# Patient Record
Sex: Female | Born: 1955
Health system: Southern US, Community
[De-identification: ages and names within clinical notes are randomized; demographics above are authoritative.]

## PROBLEM LIST (undated history)

## (undated) DIAGNOSIS — D649 Anemia, unspecified: Secondary | ICD-10-CM

## (undated) DIAGNOSIS — F32A Depression, unspecified: Secondary | ICD-10-CM

## (undated) DIAGNOSIS — M1990 Unspecified osteoarthritis, unspecified site: Secondary | ICD-10-CM

## (undated) DIAGNOSIS — IMO0002 Reserved for concepts with insufficient information to code with codable children: Secondary | ICD-10-CM

## (undated) DIAGNOSIS — M6530 Trigger finger, unspecified finger: Secondary | ICD-10-CM

## (undated) DIAGNOSIS — K219 Gastro-esophageal reflux disease without esophagitis: Secondary | ICD-10-CM

## (undated) DIAGNOSIS — M8430XA Stress fracture, unspecified site, initial encounter for fracture: Secondary | ICD-10-CM

## (undated) DIAGNOSIS — E785 Hyperlipidemia, unspecified: Secondary | ICD-10-CM

## (undated) DIAGNOSIS — F418 Other specified anxiety disorders: Secondary | ICD-10-CM

## (undated) DIAGNOSIS — B009 Herpesviral infection, unspecified: Secondary | ICD-10-CM

## (undated) DIAGNOSIS — T7840XA Allergy, unspecified, initial encounter: Secondary | ICD-10-CM

## (undated) DIAGNOSIS — R87619 Unspecified abnormal cytological findings in specimens from cervix uteri: Secondary | ICD-10-CM

## (undated) DIAGNOSIS — F329 Major depressive disorder, single episode, unspecified: Secondary | ICD-10-CM

## (undated) DIAGNOSIS — H269 Unspecified cataract: Secondary | ICD-10-CM

## (undated) DIAGNOSIS — R739 Hyperglycemia, unspecified: Secondary | ICD-10-CM

## (undated) HISTORY — DX: Unspecified cataract: H26.9

## (undated) HISTORY — DX: Reserved for concepts with insufficient information to code with codable children: IMO0002

## (undated) HISTORY — PX: LEEP: SHX91

## (undated) HISTORY — DX: Unspecified osteoarthritis, unspecified site: M19.90

## (undated) HISTORY — DX: Depression, unspecified: F32.A

## (undated) HISTORY — DX: Herpesviral infection, unspecified: B00.9

## (undated) HISTORY — DX: Unspecified abnormal cytological findings in specimens from cervix uteri: R87.619

## (undated) HISTORY — DX: Major depressive disorder, single episode, unspecified: F32.9

## (undated) HISTORY — PX: HAMMER TOE SURGERY: SHX385

## (undated) HISTORY — DX: Gastro-esophageal reflux disease without esophagitis: K21.9

## (undated) HISTORY — DX: Other specified anxiety disorders: F41.8

## (undated) HISTORY — DX: Anemia, unspecified: D64.9

## (undated) HISTORY — DX: Allergy, unspecified, initial encounter: T78.40XA

## (undated) HISTORY — PX: WISDOM TOOTH EXTRACTION: SHX21

## (undated) HISTORY — DX: Trigger finger, unspecified finger: M65.30

## (undated) HISTORY — PX: REFRACTIVE SURGERY: SHX103

## (undated) HISTORY — DX: Hyperlipidemia, unspecified: E78.5

## (undated) HISTORY — DX: Stress fracture, unspecified site, initial encounter for fracture: M84.30XA

## (undated) HISTORY — PX: COLONOSCOPY: SHX174

## (undated) HISTORY — PX: EYE SURGERY: SHX253

## (undated) HISTORY — DX: Hyperglycemia, unspecified: R73.9

---

## 1998-06-15 ENCOUNTER — Other Ambulatory Visit: Admission: RE | Admit: 1998-06-15 | Discharge: 1998-06-15 | Payer: Self-pay | Admitting: Obstetrics and Gynecology

## 1998-07-08 ENCOUNTER — Encounter: Payer: Self-pay | Admitting: Obstetrics and Gynecology

## 1998-07-08 ENCOUNTER — Ambulatory Visit (HOSPITAL_COMMUNITY): Admission: RE | Admit: 1998-07-08 | Discharge: 1998-07-08 | Payer: Self-pay | Admitting: Obstetrics and Gynecology

## 1999-07-22 ENCOUNTER — Other Ambulatory Visit: Admission: RE | Admit: 1999-07-22 | Discharge: 1999-07-22 | Payer: Self-pay | Admitting: Obstetrics and Gynecology

## 1999-08-13 ENCOUNTER — Encounter: Payer: Self-pay | Admitting: Obstetrics and Gynecology

## 1999-08-13 ENCOUNTER — Ambulatory Visit (HOSPITAL_COMMUNITY): Admission: RE | Admit: 1999-08-13 | Discharge: 1999-08-13 | Payer: Self-pay | Admitting: Obstetrics and Gynecology

## 2000-07-24 ENCOUNTER — Other Ambulatory Visit: Admission: RE | Admit: 2000-07-24 | Discharge: 2000-07-24 | Payer: Self-pay | Admitting: Obstetrics and Gynecology

## 2000-09-20 ENCOUNTER — Ambulatory Visit (HOSPITAL_COMMUNITY): Admission: RE | Admit: 2000-09-20 | Discharge: 2000-09-20 | Payer: Self-pay | Admitting: Obstetrics and Gynecology

## 2000-09-20 ENCOUNTER — Encounter: Payer: Self-pay | Admitting: Obstetrics and Gynecology

## 2001-07-26 ENCOUNTER — Other Ambulatory Visit: Admission: RE | Admit: 2001-07-26 | Discharge: 2001-07-26 | Payer: Self-pay | Admitting: Obstetrics and Gynecology

## 2001-12-11 ENCOUNTER — Ambulatory Visit (HOSPITAL_COMMUNITY): Admission: RE | Admit: 2001-12-11 | Discharge: 2001-12-11 | Payer: Self-pay | Admitting: Obstetrics and Gynecology

## 2001-12-11 ENCOUNTER — Encounter: Payer: Self-pay | Admitting: Obstetrics and Gynecology

## 2002-08-20 ENCOUNTER — Other Ambulatory Visit: Admission: RE | Admit: 2002-08-20 | Discharge: 2002-08-20 | Payer: Self-pay | Admitting: Obstetrics and Gynecology

## 2003-01-01 ENCOUNTER — Ambulatory Visit (HOSPITAL_COMMUNITY): Admission: RE | Admit: 2003-01-01 | Discharge: 2003-01-01 | Payer: Self-pay | Admitting: Obstetrics and Gynecology

## 2003-01-01 ENCOUNTER — Encounter: Payer: Self-pay | Admitting: Obstetrics and Gynecology

## 2003-08-27 ENCOUNTER — Other Ambulatory Visit: Admission: RE | Admit: 2003-08-27 | Discharge: 2003-08-27 | Payer: Self-pay | Admitting: Obstetrics and Gynecology

## 2004-02-23 ENCOUNTER — Ambulatory Visit (HOSPITAL_COMMUNITY): Admission: RE | Admit: 2004-02-23 | Discharge: 2004-02-23 | Payer: Self-pay | Admitting: Obstetrics and Gynecology

## 2004-09-14 ENCOUNTER — Other Ambulatory Visit: Admission: RE | Admit: 2004-09-14 | Discharge: 2004-09-14 | Payer: Self-pay | Admitting: Obstetrics and Gynecology

## 2005-04-20 ENCOUNTER — Ambulatory Visit (HOSPITAL_COMMUNITY): Admission: RE | Admit: 2005-04-20 | Discharge: 2005-04-20 | Payer: Self-pay | Admitting: Obstetrics and Gynecology

## 2005-09-14 ENCOUNTER — Other Ambulatory Visit: Admission: RE | Admit: 2005-09-14 | Discharge: 2005-09-14 | Payer: Self-pay | Admitting: Obstetrics and Gynecology

## 2006-06-23 ENCOUNTER — Ambulatory Visit (HOSPITAL_COMMUNITY): Admission: RE | Admit: 2006-06-23 | Discharge: 2006-06-23 | Payer: Self-pay | Admitting: Obstetrics and Gynecology

## 2006-09-12 DIAGNOSIS — M8430XA Stress fracture, unspecified site, initial encounter for fracture: Secondary | ICD-10-CM

## 2006-09-12 HISTORY — DX: Stress fracture, unspecified site, initial encounter for fracture: M84.30XA

## 2006-11-24 ENCOUNTER — Other Ambulatory Visit: Admission: RE | Admit: 2006-11-24 | Discharge: 2006-11-24 | Payer: Self-pay | Admitting: Obstetrics & Gynecology

## 2007-10-30 ENCOUNTER — Ambulatory Visit (HOSPITAL_COMMUNITY): Admission: RE | Admit: 2007-10-30 | Discharge: 2007-10-30 | Payer: Self-pay | Admitting: Obstetrics and Gynecology

## 2007-12-24 ENCOUNTER — Other Ambulatory Visit: Admission: RE | Admit: 2007-12-24 | Discharge: 2007-12-24 | Payer: Self-pay | Admitting: Obstetrics & Gynecology

## 2008-12-19 ENCOUNTER — Ambulatory Visit (HOSPITAL_COMMUNITY): Admission: RE | Admit: 2008-12-19 | Discharge: 2008-12-19 | Payer: Self-pay | Admitting: Obstetrics and Gynecology

## 2008-12-26 ENCOUNTER — Other Ambulatory Visit: Admission: RE | Admit: 2008-12-26 | Discharge: 2008-12-26 | Payer: Self-pay | Admitting: Obstetrics & Gynecology

## 2009-09-07 ENCOUNTER — Encounter: Payer: Self-pay | Admitting: Family Medicine

## 2009-09-12 LAB — HM MAMMOGRAPHY

## 2009-11-26 ENCOUNTER — Encounter (INDEPENDENT_AMBULATORY_CARE_PROVIDER_SITE_OTHER): Payer: Self-pay | Admitting: *Deleted

## 2009-12-01 ENCOUNTER — Encounter (INDEPENDENT_AMBULATORY_CARE_PROVIDER_SITE_OTHER): Payer: Self-pay | Admitting: *Deleted

## 2009-12-02 ENCOUNTER — Ambulatory Visit: Payer: Self-pay | Admitting: Internal Medicine

## 2009-12-11 ENCOUNTER — Ambulatory Visit: Payer: Self-pay | Admitting: Internal Medicine

## 2009-12-11 LAB — HM COLONOSCOPY

## 2010-01-11 ENCOUNTER — Ambulatory Visit (HOSPITAL_COMMUNITY): Admission: RE | Admit: 2010-01-11 | Discharge: 2010-01-11 | Payer: Self-pay | Admitting: Obstetrics & Gynecology

## 2010-02-10 LAB — CONVERTED CEMR LAB

## 2010-10-11 ENCOUNTER — Ambulatory Visit
Admission: RE | Admit: 2010-10-11 | Discharge: 2010-10-11 | Payer: Self-pay | Source: Home / Self Care | Attending: Family Medicine | Admitting: Family Medicine

## 2010-10-11 DIAGNOSIS — Z8679 Personal history of other diseases of the circulatory system: Secondary | ICD-10-CM | POA: Insufficient documentation

## 2010-10-11 DIAGNOSIS — Z9189 Other specified personal risk factors, not elsewhere classified: Secondary | ICD-10-CM | POA: Insufficient documentation

## 2010-10-11 DIAGNOSIS — E663 Overweight: Secondary | ICD-10-CM | POA: Insufficient documentation

## 2010-10-11 DIAGNOSIS — D649 Anemia, unspecified: Secondary | ICD-10-CM | POA: Insufficient documentation

## 2010-10-11 DIAGNOSIS — R030 Elevated blood-pressure reading, without diagnosis of hypertension: Secondary | ICD-10-CM | POA: Insufficient documentation

## 2010-10-11 DIAGNOSIS — M722 Plantar fascial fibromatosis: Secondary | ICD-10-CM | POA: Insufficient documentation

## 2010-10-11 DIAGNOSIS — J309 Allergic rhinitis, unspecified: Secondary | ICD-10-CM | POA: Insufficient documentation

## 2010-10-12 NOTE — Miscellaneous (Signed)
Summary: [facility]  Clinical Lists Changes  Medications: Added new medication of MOVIPREP 100 GM  SOLR (PEG-KCL-NACL-NASULF-NA ASC-C) As per prep instructions. - Signed Rx of MOVIPREP 100 GM  SOLR (PEG-KCL-NACL-NASULF-NA ASC-C) As per prep instructions.;  #1 x 0;  Signed;  Entered by: Ezra Sites RN;  Authorized by: Hart Carwin MD;  Method used: [facility] [phone]*, [address], [place], [place]  [address], Ph: [phone] or [phone], Fax: [phone] Allergies: Added new allergy or adverse reaction of CODEINE Observations: Added new observation of NKA: F (12/02/2009 12:48)    Prescriptions: MOVIPREP 100 GM  SOLR (PEG-KCL-NACL-NASULF-NA ASC-C) As per prep instructions.  #1 x 0   Entered by:   Ezra Sites RN   Authorized by:   Hart Carwin MD   Signed by:   Ezra Sites RN on 12/02/2009   Method used:   [facility]ronically to        [facility] [phone]* (retail)       [address]       [place], [place]  [address]       Ph: [phone] or [phone]       Fax: [phone]   RxID:   [id number]

## 2010-10-12 NOTE — Procedures (Signed)
Summary: Colonoscopy  Patient: Breanna Bridges Note: All result statuses are Final unless otherwise noted.  Tests: (1) Colonoscopy (COL)   COL Colonoscopy           DONE (C)     [facility]     [address]     [place], [place]  [address]           COLONOSCOPY PROCEDURE REPORT           PATIENT:  [name], [name]  MR#:  [id number]     BIRTHDATE:  28-Apr-1956, 53 yrs. old  GENDER:  female     ENDOSCOPIST:  Hedwig Morton. Juanda Chance, MD     REF. BY:  Talbot Grumbling. Creta Levin, M.D.     PROCEDURE DATE:  12/11/2009     PROCEDURE:  Colonoscopy 13086     ASA CLASS:  Class I     INDICATIONS:  Routine Risk Screening     MEDICATIONS:   Versed 10 mcg, Fentanyl 100 mcg           DESCRIPTION OF PROCEDURE:   After the risks benefits and     alternatives of the procedure were thoroughly explained, informed     consent was obtained.  Digital rectal exam was performed and     revealed no rectal masses.   The LB CF-H180AL [id number] endoscope     was introduced through the anus and advanced to the cecum, which     was identified by both the appendix and ileocecal valve, without     limitations.  The quality of the prep was good, using MoviPrep.     The instrument was then slowly withdrawn as the colon was fully     examined.     <<PROCEDUREIMAGES>>           FINDINGS:  No polyps or cancers were seen (see image1, image2,     image3, and image4).   Retroflexed views in the rectum revealed no     abnormalities.    The scope was then withdrawn from the patient     and the procedure completed.           COMPLICATIONS:  None     ENDOSCOPIC IMPRESSION:     1) No polyps or cancers     2) Normal colonoscopy     RECOMMENDATIONS:     1) high fiber diet     REPEAT EXAM:  In 10 year(s) for.           ______________________________     Hedwig Morton. Juanda Chance, MD           CC:           n.     REVISED:  12/22/2009 01:08 PM     eSIGNED:   Hedwig Morton. Aneira Cavitt at 12/22/2009 01:08 PM           [name],  [id number]  Note: An exclamation mark (!) indicates a result that was not dispersed into the flowsheet. Document Creation Date: 12/22/2009 1:08 PM _______________________________________________________________________  (1) Order result status: Final Collection or observation date-time: 12/11/2009 14:46 Requested date-time:  Receipt date-time:  Reported date-time:  Referring Physician:   Ordering Physician: [name] [phone]) Specimen Source:  Source: [name] Order Number: [id number] Lab site:

## 2010-10-12 NOTE — Letter (Signed)
Summary: Previsit letter  [facility]  [address] [place], [place] [address]   Phone: [phone]  Fax: [phone]       11/26/2009 MRN: [id number]  Breanna Bridges [address] [place], [place]  [address]  Dear Ms. Breanna Bridges,  Welcome to the Gastroenterology Division at [facility].    You are scheduled to see a nurse for your pre-procedure visit on 12/02/2009 at 1:00PM on the 3rd floor at [facility], [address]  We ask that you try to arrive at our office 15 minutes prior to your appointment time to allow for check-in.  Your nurse visit will consist of discussing your medical and surgical history, your immediate family medical history, and your medications.    Please bring a complete list of all your medications or, if you prefer, bring the medication bottles and we will list them.  We will need to be aware of both prescribed and over the counter drugs.  We will need to know exact dosage information as well.  If you are on blood thinners (Coumadin, Plavix, Aggrenox, Ticlid, etc.) please call our office today/prior to your appointment, as we need to consult with your physician about holding your medication.   Please be prepared to read and sign documents such as consent forms, a financial agreement, and acknowledgement forms.  If necessary, and with your consent, a friend or relative is welcome to sit-in on the nurse visit with you.  Please bring your insurance card so that we may make a copy of it.  If your insurance requires a referral to see a specialist, please bring your referral form from your primary care physician.  No co-pay is required for this nurse visit.     If you cannot keep your appointment, please call [phone] to cancel or reschedule prior to your appointment date.  This allows [place] the opportunity to schedule an appointment for another patient in need of care.    Thank you for choosing [facility] for your medical  needs.  We appreciate the opportunity to care for you.  Please visit [place] at our website  to learn more about our practice.                     Sincerely.                                                                                                                   The Gastroenterology Division

## 2010-10-12 NOTE — Procedures (Signed)
Summary: Colonoscopy  Patient: [name] Note: All result statuses are Final unless otherwise noted.  Tests: (1) Colonoscopy (COL)   COL Colonoscopy           DONE     [facility]     [address]     [place], [place]  [address]           COLONOSCOPY PROCEDURE REPORT           PATIENT:  Breanna Bridges, Breanna Bridges  MR#:  [id number]     BIRTHDATE:  [birth year], 53 yrs. old  GENDER:  female     ENDOSCOPIST:  Hedwig Morton. Juanda Chance, MD     REF. BY:  Talbot Grumbling. Creta Levin, M.D.     PROCEDURE DATE:  12/11/2009     PROCEDURE:  Colonoscopy 19147     ASA CLASS:  Class I     INDICATIONS:  Routine Risk Screening     MEDICATIONS:   Versed 10 mcg           DESCRIPTION OF PROCEDURE:   After the risks benefits and     alternatives of the procedure were thoroughly explained, informed     consent was obtained.  Digital rectal exam was performed and     revealed no rectal masses.   The LB CF-H180AL [id number] endoscope     was introduced through the anus and advanced to the cecum, which     was identified by both the appendix and ileocecal valve, without     limitations.  The quality of the prep was good, using MoviPrep.     The instrument was then slowly withdrawn as the colon was fully     examined.     <<PROCEDUREIMAGES>>           FINDINGS:  No polyps or cancers were seen (see image1, image2,     image3, and image4).   Retroflexed views in the rectum revealed no     abnormalities.    The scope was then withdrawn from the patient     and the procedure completed.           COMPLICATIONS:  None     ENDOSCOPIC IMPRESSION:     1) No polyps or cancers     2) Normal colonoscopy     RECOMMENDATIONS:     1) high fiber diet     REPEAT EXAM:  In 10 year(s) for.           ______________________________     Hedwig Morton. Juanda Chance, MD           CC:           n.     eSIGNED:   Hedwig Morton. Bren Borys at 12/11/2009 02:55 PM           [name], [id number]  Note: An exclamation mark (!) indicates a result  that was not dispersed into the flowsheet. Document Creation Date: 12/11/2009 2:55 PM _______________________________________________________________________  (1) Order result status: Final Collection or observation date-time: 12/11/2009 14:46 Requested date-time:  Receipt date-time:  Reported date-time:  Referring Physician:   Ordering Physician: [name] [phone]) Specimen Source:  Source: [name] Order Number: [id number] Lab site:   Appended Document: Colonoscopy    Clinical Lists Changes  Observations: Added new observation of COLONNXTDUE: 12/2019 (12/11/2009 16:17)

## 2010-10-12 NOTE — Letter (Signed)
Summary: [facility] Instructions  [facility]  [address] [place], [place] [address]   Phone: [phone]  Fax: [phone]       Breanna Bridges    03/28/1956    MRN: [id number]        Procedure Day /Date:  Friday  04/01/1     Arrival Time:  1:00pm     Procedure Time:  2:00pm     Location of Procedure:                    _X _  [facility] (4th Floor)                        PREPARATION FOR COLONOSCOPY WITH MOVIPREP   Starting 5 days prior to your procedure  Sunday 03/27  do not eat nuts, seeds, popcorn, corn, beans, peas,  salads, or any raw vegetables.  Do not take any fiber supplements (e.g. Metamucil, Citrucel, and Benefiber).  THE DAY BEFORE YOUR PROCEDURE         DATE: 03/31   DAY:  Thursday  1.  Drink clear liquids the entire day-NO SOLID FOOD  2.  Do not drink anything colored red or purple.  Avoid juices with pulp.  No orange juice.  3.  Drink at least 64 oz. (8 glasses) of fluid/clear liquids during the day to prevent dehydration and help the prep work efficiently.  CLEAR LIQUIDS INCLUDE: Water Jello Ice Popsicles Tea (sugar ok, no milk/cream) Powdered fruit flavored drinks Coffee (sugar ok, no milk/cream) Gatorade Juice: apple, white grape, white cranberry  Lemonade Clear bullion, consomm, broth Carbonated beverages (any kind) Strained chicken noodle soup Hard Candy                             4.  In the morning, mix first dose of MoviPrep solution:    Empty 1 Pouch A and 1 Pouch B into the disposable container    Add lukewarm drinking water to the top line of the container. Mix to dissolve    Refrigerate (mixed solution should be used within 24 hrs)  5.  Begin drinking the prep at 5:00 p.m. The MoviPrep container is divided by 4 marks.   Every 15 minutes drink the solution down to the next mark (approximately 8 oz) until the full liter is complete.   6.  Follow completed prep with 16 oz of clear liquid of your  choice (Nothing red or purple).  Continue to drink clear liquids until bedtime.  7.  Before going to bed, mix second dose of MoviPrep solution:    Empty 1 Pouch A and 1 Pouch B into the disposable container    Add lukewarm drinking water to the top line of the container. Mix to dissolve    Refrigerate  THE DAY OF YOUR PROCEDURE      DATE: 04/01  DAY: Friday  Beginning at  9:00 a.m. (5 hours before procedure):         1. Every 15 minutes, drink the solution down to the next mark (approx 8 oz) until the full liter is complete.  2. Follow completed prep with 16 oz. of clear liquid of your choice.    3. You may drink clear liquids until 12:00pm (2 HOURS BEFORE PROCEDURE).   MEDICATION INSTRUCTIONS  Unless otherwise instructed, you should take regular prescription medications with a small sip of water  as early as possible the morning of your procedure.           OTHER INSTRUCTIONS  You will need a responsible adult at least 55 years of age to accompany you and drive you home.   This person must remain in the waiting room during your procedure.  Wear loose fitting clothing that is easily removed.  Leave jewelry and other valuables at home.  However, you may wish to bring a book to read or  an iPod/MP3 player to listen to music as you wait for your procedure to start.  Remove all body piercing jewelry and leave at home.  Total time from sign-in until discharge is approximately 2-3 hours.  You should go home directly after your procedure and rest.  You can resume normal activities the  day after your procedure.  The day of your procedure you should not:   Drive   Make legal decisions   Operate machinery   Drink alcohol   Return to work  You will receive specific instructions about eating, activities and medications before you leave.    The above instructions have been reviewed and explained to me by   Ezra Sites RN  December 02, 2009 1:13 PM     I fully  understand and can verbalize these instructions _____________________________ Date _________

## 2010-10-19 ENCOUNTER — Encounter: Payer: Self-pay | Admitting: Family Medicine

## 2010-10-20 LAB — CONVERTED CEMR LAB
ALT: 12 units/L (ref 0–35)
Alkaline Phosphatase: 66 units/L (ref 39–117)
Bilirubin, Direct: 0.1 mg/dL (ref 0.0–0.3)
CO2: 27 meq/L (ref 19–32)
Chloride: 104 meq/L (ref 96–112)
HDL: 75 mg/dL (ref >39)
Hemoglobin: 12.5 g/dL (ref 12.0–15.0)
Indirect Bilirubin: 0.3 mg/dL (ref 0.0–0.9)
MCHC: 32.1 g/dL (ref 30.0–36.0)
MCV: 92.2 fL (ref 78.0–100.0)
Platelets: 248 10*3/uL (ref 150–400)
Potassium: 4.4 meq/L (ref 3.5–5.3)
RBC: 4.23 M/uL (ref 3.87–5.11)
Sodium: 139 meq/L (ref 135–145)
TSH: 1.697 microintl units/mL (ref 0.350–4.500)
Total Protein: 6.4 g/dL (ref 6.0–8.3)
WBC: 4 10*3/uL (ref 4.0–10.5)

## 2010-10-20 NOTE — Assessment & Plan Note (Signed)
Summary: New pt est care/dt [employer]   Vital Signs:  Patient profile:   55 year old female Menstrual status:  regular LMP:     09/14/2010 Height:      62 inches (157.48 cm) Weight:      167.56 pounds (76.16 kg) BMI:     30.76 O2 Sat:      96 % on Room air Temp:     98.0 degrees F (36.67 degrees C) oral Pulse rate:   79 / minute BP sitting:   144 / 82  (right arm)  Vitals Entered By: Josph Macho RMA (October 11, 2010 11:17 AM)  O2 Flow:  Room air  Serial Vital Signs/Assessments:  Time      Position  BP       Pulse  Resp  Temp     By                     144/84                         Danise Edge MD  CC: Establish new patient/ CF, Lipid Management Is Patient Diabetic? No LMP (date): 09/14/2010     Menstrual Status regular Enter LMP: 09/14/2010 Last PAP Result historical   History of Present Illness: patient is a 55 year old Caucasian female in today to establish care. She previously been seen at [facility] but is in need of a new PMD. She sees Dr. Corrie Mckusick of OB/GYN and will continue to do her GYN exams there. She also sees orthopedics Dr. Nunzio Cory for chronic left foot pain secondary to plantar fasciitis and multiple injuries during 1/5 metatarsal fracture and multiple episodes of tendon damage. She reports generally been in good health did undergo colonoscopy last Dr. Juanda Chance which she will ports was unremarkable. She has bilateral LASIK surgery that 5 years ago and is now beginning to any classes again. Is following with Dr. Celene Kras and Dr. Dione Booze up for some recent development of floaters and flashes in her right eye. Today they tell her they see nothing serious and no retinal damage. No recent illness, fevers, chills, congestion, cough, chest pain, palpitations, shortness of breath, GI or GU concerns at today's visit  Lipid Management History:      Negative NCEP/ATP III risk factors include female age less than 64 years old and non-tobacco-user  status.     Preventive Screening-Counseling & Management  Alcohol-Tobacco     Smoking Status: quit  Caffeine-Diet-Exercise     Does Patient Exercise: yes  Current Medications (verified): 1)  Protonix 40 Mg Tbec (Pantoprazole Sodium) .... Once Daily 2)  Flonase 50 Mcg/act Susp (Fluticasone Propionate) .... Once Daily in Spring, Summer, Fall 3)  Tri-Previfem 0.18/0.215/0.25 Mg-35 Mcg Tabs (Norgestim-Eth Estrad Triphasic) .... Once Daily 4)  Effexor Xr 37.5 Mg Xr24h-Cap (Venlafaxine Hcl) .... Once Daily 5)  Famciclovir 125 Mg Tabs (Famciclovir) .... Once Daily 6)  Zyrtec Allergy 10 Mg Tabs (Cetirizine Hcl) .... Once Daily 7)  Aspirin 81 Mg Tbec (Aspirin) .... Once Daily 8)  Calcium 400 Mg .... Once Daily 9)  Multivitamins  Tabs (Multiple Vitamin) .... Once Daily  Allergies (verified): 1)  ! Codeine  Past History:  Past Surgical History: Hammer toe repair left foot, 10+ years LEEP 15+ years, normal paps since then Lasik surgery b/l 5 years ago  Family History: Father: deceased@91 , CHF Mother: deceased@89 , CHF, borderline DM, HTN, overweight, anxiety d/o Siblings:  Sister: 75,  overweight, DMII, HTN, anxiety d/o, diverticulosis Sister: 49, overweight, fibrocystic breast disease, anxiety d/o Brother: 93, overweight, anxiety d/o Sister: 70, overweight MGM: deceased in mid 51s, old age MGF: deceased in 58s, history unknown PGM: deceased@96 , overweight, old age PGF: deceased in 54s or 6s, committed suicide during the [place] Depression PAunt: deceased in mid 38s, h/o breast cancer MAunt: deceased@89 , h/o breast cancer Children: None  Social History: Married Has 3 adult step children, 2 boys and a girl, good health Occupation: [employer] Former Smoker, light history in college quit in mid 20s Alcohol use-yes Regular exercise-yes, gym 3 x wk, Zumba, [employer] a couple miles 2-3 x week 1 cup of coffee Weight Watchers, no other dietary Wears seat belt daily Occupation:   employed Smoking Status:  quit Does Patient Exercise:  yes  Review of Systems  The patient denies anorexia, fever, weight loss, weight gain, vision loss, decreased hearing, hoarseness, chest pain, syncope, dyspnea on exertion, peripheral edema, prolonged cough, headaches, hemoptysis, abdominal pain, melena, hematochezia, severe indigestion/heartburn, hematuria, incontinence, genital sores, muscle weakness, suspicious skin lesions, transient blindness, difficulty walking, unusual weight change, abnormal bleeding, and enlarged lymph nodes.         Anxiety well controlled on just 37.5 mg of Effexor, she did not tolerate some higher doses Sees Dr Lane Hacker of orthopaedics for chronic left foot pain, plantar fasciities and tendon damage, uses stretching, NSAIDS and ice when pain is bad For her eyes she sees Dr Celene Kras and did get a second opinion with Dr Alden Hipp recently. Dr Dickie La of GI did her colonoscopy last year and it was normal Dr Corrie Mckusick, OB/GYN does her paps  Physical Exam  General:  Well-developed,well-nourished,in no acute distress; alert,appropriate and cooperative throughout examination Head:  Normocephalic and atraumatic without obvious abnormalities. No apparent alopecia or balding. Eyes:  No corneal or conjunctival inflammation noted. EOMI. Perrla. Ears:  External ear exam shows no significant lesions or deformities.  Otoscopic examination reveals clear canals, tympanic membranes are intact bilaterally without bulging, retraction, inflammation or discharge. Hearing is grossly normal bilaterally. Nose:  External nasal examination shows no deformity or inflammation. Nasal mucosa are pink and moist without lesions or exudates. Mouth:  Oral mucosa and oropharynx without lesions or exudates.  Teeth in good repair. Neck:  No deformities, masses, or tenderness noted. Lungs:  Normal respiratory effort, chest expands symmetrically. Lungs are clear to auscultation, no crackles or  wheezes. Heart:  Normal rate and regular rhythm. S1 and S2 normal without gallop, murmur, click, rub or other extra sounds. Abdomen:  Bowel sounds positive,abdomen soft and non-tender without masses, organomegaly or hernias noted. Msk:  No deformity or scoliosis noted of thoracic or lumbar spine.   Pulses:  R and L carotid,radial,femoral,dorsalis pedis and posterior tibial pulses are full and equal bilaterally Extremities:  No clubbing, cyanosis, edema, or deformity noted with normal full range of motion of all joints.   Neurologic:  No cranial nerve deficits noted. Station and gait are normal. Plantar reflexes are down-going bilaterally. DTRs are symmetrical throughout. Sensory, motor and coordinative functions appear intact. Skin:  Intact without suspicious lesions or rashes Cervical Nodes:  No lymphadenopathy noted Psych:  Cognition and judgment appear intact. Alert and cooperative with normal attention span and concentration. No apparent delusions, illusions, hallucinations   Impression & Recommendations:  Problem # 1:  ELEVATED BP READING WITHOUT DX HYPERTENSION (ICD-796.2) Avoid sodium, minimize caffeiine, get 7-8 hours of sleep daily and check bp one to two x weekly if BP  remains elevated call other wise we will check it again at next visit  Problem # 2:  UNSPECIFIED ANEMIA (ICD-285.9) Increase leafy greens and check CBC prior to next visit.  Problem # 3:  OVERWEIGHT (ICD-278.02)  Orders: T-TSH ([id number]) Encouraged increased activity, avoid trans fats and eat small, frequent meals with lean proteins and complex carbs  Problem # 4:  PLANTAR FASCIITIS, LEFT (ICD-728.71) Encouraged ice, stretching and Aspercreme as needed, consider compression hose on in am off in pm to help with pain  Problem # 5:  ELEVATED BP READING WITHOUT DX HYPERTENSION (ICD-796.2) Mild elevation today, patient encouraged to maintain 7-8 hours of sleep, do not skip meals, minimize, caffeine and sodium,  reassess at next visit.  Problem # 6:  ALLERGIC RHINITIS (ICD-477.9)  Her updated medication list for this problem includes:    Flonase 50 Mcg/act Susp (Fluticasone propionate) ..... Once daily in spring, summer, fall    Zyrtec Allergy 10 Mg Tabs (Cetirizine hcl) ..... Once daily May continue to use meds pren and notify us if symptoms worsen  Complete Medication List: 1)  Protonix 40 Mg Tbec (Pantoprazole sodium) .Marland Kitchen.. 1 tab by mouth daily 2)  Flonase 50 Mcg/act Susp (Fluticasone propionate) .... Once daily in spring, summer, fall 3)  Tri-previfem 0.18/0.215/0.25 Mg-35 Mcg Tabs (Norgestim-eth estrad triphasic) .... Once daily 4)  Effexor Xr 37.5 Mg Xr24h-cap (Venlafaxine hcl) .... Once daily 5)  Famciclovir 125 Mg Tabs (Famciclovir) .... Once daily 6)  Zyrtec Allergy 10 Mg Tabs (Cetirizine hcl) .... Once daily 7)  Aspirin 81 Mg Tbec (Aspirin) .... Once daily 8)  Calcium 400 Mg  .... Once daily 9)  Multivitamins Tabs (Multiple vitamin) .... Once daily  Other Orders: T-Basic Metabolic Panel [phone]) T-Hepatic Function [phone]) T-CBC No Diff ([id number]) T-Lipid Profile ([id number])  Lipid Assessment/Plan:      Based on NCEP/ATP III, the patient's risk factor category is "0-1 risk factors".  The patient's lipid goals are as follows: Total cholesterol goal is 200; LDL cholesterol goal is 160; HDL cholesterol goal is 40; Triglyceride goal is 150.    Patient Instructions: 1)  Please schedule a follow-up appointment in 1 to 2  months or sooner as needed 2)  Check BP 1-2 x weekly after resting 10 minutes, call if top greater than 135 and bottom greater than 90 consistently 3)  Minimize sodium and caffeine and get 7-8 hours of sleep. 4)  Release of Records Dr Corrie Mckusick, OB/GYN labs and immunizations 5)  Release of Records [facility] Prescriptions: PROTONIX 40 MG TBEC (PANTOPRAZOLE SODIUM) 1 tab by mouth daily  #90 x 3   Entered and Authorized by:   Danise Edge MD    Signed by:   Danise Edge MD on 10/11/2010   Method used:   Faxed to ...       [facility] (mail-order)             , [place]         Ph: [phone]       Fax: [phone]   RxID:   [id number]    Orders Added: 1)  T-Basic Metabolic Panel [phone] 2)  T-Hepatic Function [phone] 3)  T-CBC No Diff [[id number]] 4)  T-Lipid Profile [[id number]] 5)  T-TSH [37628-31517] 6)  New Patient Level IV [[address]07]   Immunization History:  Influenza Immunization History:    Influenza:  historical (05/13/2010)   Immunization History:  Influenza Immunization History:    Influenza:  Historical (05/13/2010)  Preventive Care Screening  Pap  Smear:    Date:  02/10/2010    Results:  historical   Mammogram:    Date:  09/12/2009    Results:  historical   Last Tetanus Booster:    Date:  09/13/2007    Results:  Historical

## 2010-10-22 ENCOUNTER — Encounter: Payer: Self-pay | Admitting: *Deleted

## 2010-11-03 NOTE — Letter (Signed)
Summary: Records from [facility] 2007 - 2  Records from [facility] 2007 - 2010   Imported By: Maryln Gottron 10/29/2010 15:55:49  _____________________________________________________________________  External Attachment:    Type:   Image     Comment:   External Document

## 2010-11-23 ENCOUNTER — Encounter: Payer: Self-pay | Admitting: Family Medicine

## 2010-11-23 ENCOUNTER — Other Ambulatory Visit: Payer: Self-pay | Admitting: Family Medicine

## 2010-11-23 ENCOUNTER — Ambulatory Visit (HOSPITAL_BASED_OUTPATIENT_CLINIC_OR_DEPARTMENT_OTHER)
Admission: RE | Admit: 2010-11-23 | Discharge: 2010-11-23 | Disposition: A | Discharge status: Home / Self Care | Payer: Managed Care, Other (non HMO) | Source: Ambulatory Visit | Attending: Family Medicine | Admitting: Family Medicine

## 2010-11-23 ENCOUNTER — Ambulatory Visit (INDEPENDENT_AMBULATORY_CARE_PROVIDER_SITE_OTHER): Payer: Managed Care, Other (non HMO) | Admitting: Family Medicine

## 2010-11-23 DIAGNOSIS — M461 Sacroiliitis, not elsewhere classified: Secondary | ICD-10-CM | POA: Insufficient documentation

## 2010-11-23 DIAGNOSIS — M25559 Pain in unspecified hip: Secondary | ICD-10-CM | POA: Insufficient documentation

## 2010-11-23 DIAGNOSIS — D649 Anemia, unspecified: Secondary | ICD-10-CM

## 2010-11-23 DIAGNOSIS — E663 Overweight: Secondary | ICD-10-CM

## 2010-11-23 DIAGNOSIS — R030 Elevated blood-pressure reading, without diagnosis of hypertension: Secondary | ICD-10-CM

## 2010-11-23 LAB — CONVERTED CEMR LAB
Bilirubin Urine: NEGATIVE
Ketones, urine, test strip: NEGATIVE
Nitrite: NEGATIVE
Specific Gravity, Urine: 1.01
WBC Urine, dipstick: NEGATIVE
pH: 5

## 2010-11-24 ENCOUNTER — Encounter: Payer: Self-pay | Admitting: *Deleted

## 2010-11-25 ENCOUNTER — Telehealth (INDEPENDENT_AMBULATORY_CARE_PROVIDER_SITE_OTHER): Payer: Self-pay | Admitting: *Deleted

## 2010-11-30 NOTE — Assessment & Plan Note (Signed)
Summary: BACK PAIN   Vital Signs:  Patient profile:   55 year old female Menstrual status:  regular Height:      62 inches (157.48 cm) Weight:      168.75 pounds (76.70 kg) O2 Sat:      99 % on Room air Temp:     98.2 degrees F (36.78 degrees C) oral Pulse rate:   74 / minute BP sitting:   141 / 83  (right arm) Cuff size:   regular  Vitals Entered By: Josph Macho RMA (November 23, 2010 3:04 PM)  O2 Flow:  Room air CC: Lower Back pain that shoots down left buttocks to left leg X 2 weeks, off and on for a year/  CF Is Patient Diabetic? No   History of Present Illness: Patient is a 55 yo Caucasian female in today for evaluation of posterior left hip pain with some radicular symptoms down left thigh at moments radiates all the way to her foot.  she has noticed worsening pain over the last 2 weeks but in retrospect as had pain off and on over the past. he denies any trauma or injury.  she doesn't balance over the last couple of weeks she has been going to the gym when working harder as that has contributed. She should have an episode of pain that was so short history the cluster to stumble. no persistent numbness tingling or weakness although she will occasionally have pain shooting as far down as her foot. Want to stop smoking the knee. no bladder or bowel incontinence no low back pain and no similar symptoms in the right leg. been resting the last couple days only minimal improvement she has been taking soma which he had at the house with only minimal improvement. in the patches to chiropractic work but now has been helpful she denies any recent chest pain, palpitations, fevers chills, GI or GU complaints  Current Medications (verified): 1)  Protonix 40 Mg Tbec (Pantoprazole Sodium) .Marland Kitchen.. 1 Tab By Mouth Daily 2)  Flonase 50 Mcg/act Susp (Fluticasone Propionate) .... Once Daily in Spring, Summer, Fall 3)  Tri-Previfem 0.18/0.215/0.25 Mg-35 Mcg Tabs (Norgestim-Eth Estrad Triphasic) .... Once  Daily 4)  Effexor Xr 37.5 Mg Xr24h-Cap (Venlafaxine Hcl) .... Once Daily 5)  Famciclovir 125 Mg Tabs (Famciclovir) .... Once Daily 6)  Zyrtec Allergy 10 Mg Tabs (Cetirizine Hcl) .... Once Daily 7)  Aspirin 81 Mg Tbec (Aspirin) .... Once Daily 8)  Calcium 400 Mg .... Once Daily 9)  Multivitamins  Tabs (Multiple Vitamin) .... Once Daily  Allergies (verified): 1)  ! Codeine  Past History:  Past medical history reviewed for relevance to current acute and chronic problems. Social history (including risk factors) reviewed for relevance to current acute and chronic problems.  Social History: Reviewed history from 10/11/2010 and no changes required. Married Has 3 adult step children, 2 boys and a girl, good health Occupation: [employer] Former Smoker, light history in college quit in mid 20s Alcohol use-yes Regular exercise-yes, gym 3 x wk, Zumba, [employer] a couple miles 2-3 x week 1 cup of coffee Weight Watchers, no other dietary Wears seat belt daily  Review of Systems      See HPI  Physical Exam  General:  Well-developed,well-nourished,in no acute distress; alert,appropriate and cooperative throughout examination Head:  Normocephalic and atraumatic without obvious abnormalities. No apparent alopecia or balding. Mouth:  Oral mucosa and oropharynx without lesions or exudates.  Teeth in good repair. Neck:  No deformities, masses,  or tenderness noted. Lungs:  Normal respiratory effort, chest expands symmetrically. Lungs are clear to auscultation, no crackles or wheezes. Heart:  Normal rate and regular rhythm. S1 and S2 normal without gallop, murmur, click, rub or other extra sounds. Abdomen:  Bowel sounds positive,abdomen soft and non-tender without masses, organomegaly or hernias noted. Msk:  right leg negative straight leg raise and no pain with passive internal andexternal rotation at right hip. Left hip hurts with leg raise, posteriorly, pain radiates down posterior  thigh Pulses:  R and Ldorsalis pedis and posterior tibial pulses are full and equal bilaterally Extremities:  No clubbing, cyanosis, edema, or deformity noted with normal full range of motion of all joints.   Neurologic:  No cranial nerve deficits noted. Station and gait are normal. Plantar reflexes are down-going bilaterally. DTRs are symmetrical throughout. Sensory, motor and coordinative functions appear intact. Cervical Nodes:  No lymphadenopathy noted Psych:  Cognition and judgment appear intact. Alert and cooperative with normal attention span and concentration. No apparent delusions, illusions, hallucinations   Impression & Recommendations:  Problem # 1:  SACROILIITIS, LEFT (ICD-720.2)  Orders: Radiology Referral (Radiology) moist heat gentle stretching Meloxicam 15 mg by mouth once daily and Cyclobenzaprine as needed. May proceed with return to Chiropractic if xray unremarkable, call with worsening symptoms  Problem # 2:  ELEVATED BP READING WITHOUT DX HYPERTENSION (ICD-796.2) Mild elevation today reviewed bp log from home and systolic numbers in 120s and diastolic in 70s and 80s  Problem # 3:  UNSPECIFIED ANEMIA (ICD-285.9) Resolved, labs reviewed with patient and patient given copy of labs to take home with her.  Problem # 4:  OVERWEIGHT (ICD-278.02) Encouraged decreaed by mouth intake with small, frequent meals and maintain good activity level as tolerated  Complete Medication List: 1)  Protonix 40 Mg Tbec (Pantoprazole sodium) .Marland Kitchen.. 1 tab by mouth daily 2)  Flonase 50 Mcg/act Susp (Fluticasone propionate) .... Once daily in spring, summer, fall 3)  Tri-previfem 0.18/0.215/0.25 Mg-35 Mcg Tabs (Norgestim-eth estrad triphasic) .... Once daily 4)  Effexor Xr 37.5 Mg Xr24h-cap (Venlafaxine hcl) .... Once daily 5)  Famciclovir 125 Mg Tabs (Famciclovir) .... Once daily 6)  Zyrtec Allergy 10 Mg Tabs (Cetirizine hcl) .... Once daily 7)  Aspirin 81 Mg Tbec (Aspirin) .... Once  daily 8)  Calcium 400 Mg  .... Once daily 9)  Multivitamins Tabs (Multiple vitamin) .... Once daily 10)  Meloxicam 15 Mg Tabs (Meloxicam) .Marland Kitchen.. 1 tab by mouth daily as needed pain with food 11)  Cyclobenzaprine Hcl 10 Mg Tabs (Cyclobenzaprine hcl) .Marland Kitchen.. 1 tab by mouth two times a day as needed pain with food may cause sedation  Patient Instructions: 1)  Most patients (90%) with low back pain will improve with time ( 2-6 weeks). Keep active but avoid activities that are painful. Apply moist heat and/or ice to lower back several times a day.  2)  Moist heat and gentle stretching as directed 3)  May use Tylenol=Acetaminophen for breakthrough pain 4)  Cannot use Advil=Motrin=Ibuprofen or Aleve=Naproxen on same day as Mobic use 5)  Take 1000 mg of tylenol every 8 hours as needed for relief of pain or comfort of fever. Avoid taking more than 3000 mg in a 24 hour period( can cause liver damage in higher doses).  6)  Please schedule a follow-up appointment as needed .  Prescriptions: CYCLOBENZAPRINE HCL 10 MG TABS (CYCLOBENZAPRINE HCL) 1 tab by mouth two times a day as needed pain with food may cause sedation  #40 x  1   Entered and Authorized by:   Danise Edge MD   Signed by:   Danise Edge MD on 11/23/2010   Method used:   Electronically to        [facility] [phone]* (retail)       [address]       [place], [place]  [address]       Ph: [phone] or [phone]       Fax: [phone]   RxID:   [id number] MELOXICAM 15 MG TABS (MELOXICAM) 1 tab by mouth daily as needed pain with food  #30 x 3   Entered and Authorized by:   Danise Edge MD   Signed by:   Danise Edge MD on 11/23/2010   Method used:   Electronically to        [facility] [phone]* (retail)       [address]       [place], [place]  [address]       Ph: [phone] or [phone]       Fax: [phone]   RxID:   [id number]    Orders Added: 1)  Radiology Referral [Radiology] 2)  Est. Patient Level IV  [[address]]    Laboratory Results   Urine Tests    Routine Urinalysis   Color: lt. yellow Appearance: Clear Glucose: negative   (Normal Range: Negative) Bilirubin: negative   (Normal Range: Negative) Ketone: negative   (Normal Range: Negative) Spec. Gravity: 1.010   (Normal Range: 1.003-1.035) Blood: trace-lysed   (Normal Range: Negative) pH: 5.0   (Normal Range: 5.0-8.0) Protein: negative   (Normal Range: Negative) Urobilinogen: 0.2   (Normal Range: 0-1) Nitrite: negative   (Normal Range: Negative) Leukocyte Esterace: negative   (Normal Range: Negative)

## 2010-11-30 NOTE — Progress Notes (Signed)
Summary: xray results  Phone Note Call from Patient Call back at [phone]   Caller: Patient Reason for Call: Talk to Nurse Summary of Call: Pt wants to know if xray results have come in, pls call Initial call taken by: Lannette Donath,  November 25, 2010 10:28 AM  Follow-up for Phone Call        Left detailed message on pts cell phone stating Xray results were negative Follow-up by: Josph Macho RMA,  November 25, 2010 11:13 AM     Appended Document: xray results pt would like to know what she should tell chiropractor since xray results were negative.  Appended Document: xray results Notify them results normal, no acute findings, fractures or malalignment. Chiropractor may want to see a copy of the xray, patient can request copy on a disc to take to them  Appended Document: xray results pt informed

## 2011-02-04 ENCOUNTER — Emergency Department (HOSPITAL_BASED_OUTPATIENT_CLINIC_OR_DEPARTMENT_OTHER)
Admission: EM | Admit: 2011-02-04 | Discharge: 2011-02-04 | Disposition: A | Discharge status: Home / Self Care | Payer: Managed Care, Other (non HMO) | Attending: Emergency Medicine | Admitting: Emergency Medicine

## 2011-02-04 DIAGNOSIS — S0990XA Unspecified injury of head, initial encounter: Secondary | ICD-10-CM | POA: Insufficient documentation

## 2011-02-04 DIAGNOSIS — W1809XA Striking against other object with subsequent fall, initial encounter: Secondary | ICD-10-CM | POA: Insufficient documentation

## 2011-02-11 ENCOUNTER — Other Ambulatory Visit: Payer: Self-pay | Admitting: Obstetrics & Gynecology

## 2011-02-11 DIAGNOSIS — Z1231 Encounter for screening mammogram for malignant neoplasm of breast: Secondary | ICD-10-CM

## 2011-02-18 ENCOUNTER — Ambulatory Visit (HOSPITAL_COMMUNITY)
Admission: RE | Admit: 2011-02-18 | Discharge: 2011-02-18 | Disposition: A | Discharge status: Home / Self Care | Payer: Managed Care, Other (non HMO) | Source: Ambulatory Visit | Attending: Obstetrics & Gynecology | Admitting: Obstetrics & Gynecology

## 2011-02-18 DIAGNOSIS — Z1231 Encounter for screening mammogram for malignant neoplasm of breast: Secondary | ICD-10-CM | POA: Insufficient documentation

## 2011-02-23 ENCOUNTER — Other Ambulatory Visit: Payer: Self-pay | Admitting: Obstetrics & Gynecology

## 2011-02-23 DIAGNOSIS — R928 Other abnormal and inconclusive findings on diagnostic imaging of breast: Secondary | ICD-10-CM

## 2011-03-01 ENCOUNTER — Ambulatory Visit
Admission: RE | Admit: 2011-03-01 | Discharge: 2011-03-01 | Disposition: A | Discharge status: Home / Self Care | Payer: Managed Care, Other (non HMO) | Source: Ambulatory Visit | Attending: Obstetrics & Gynecology | Admitting: Obstetrics & Gynecology

## 2011-03-01 DIAGNOSIS — R928 Other abnormal and inconclusive findings on diagnostic imaging of breast: Secondary | ICD-10-CM

## 2011-12-06 ENCOUNTER — Other Ambulatory Visit: Payer: Managed Care, Other (non HMO)

## 2011-12-07 ENCOUNTER — Other Ambulatory Visit (INDEPENDENT_AMBULATORY_CARE_PROVIDER_SITE_OTHER): Payer: Managed Care, Other (non HMO)

## 2011-12-07 DIAGNOSIS — Z0000 Encounter for general adult medical examination without abnormal findings: Secondary | ICD-10-CM

## 2011-12-07 LAB — LIPID PANEL
HDL: 87.9 mg/dL (ref >39.00)
Triglycerides: 55 mg/dL (ref 0.0–149.0)

## 2011-12-07 LAB — CBC: RDW: 13.9 % (ref 11.5–14.6)

## 2011-12-07 LAB — RENAL FUNCTION PANEL
Albumin: 4.1 g/dL (ref 3.5–5.2)
BUN: 14 mg/dL (ref 6–23)
CO2: 26 meq/L (ref 19–32)
Calcium: 9 mg/dL (ref 8.4–10.5)
Chloride: 100 meq/L (ref 96–112)
Creatinine, Ser: 0.6 mg/dL (ref 0.4–1.2)
GFR: 106.01 mL/min
Glucose, Bld: 86 mg/dL (ref 70–99)
Phosphorus: 3.9 mg/dL (ref 2.3–4.6)
Potassium: 3.6 meq/L (ref 3.5–5.1)
Sodium: 136 meq/L (ref 135–145)

## 2011-12-07 LAB — HEPATIC FUNCTION PANEL
ALT: 14 U/L (ref 0–35)
AST: 19 U/L (ref 0–37)
Alkaline Phosphatase: 80 U/L (ref 39–117)
Bilirubin, Direct: 0 mg/dL (ref 0.0–0.3)
Total Bilirubin: 0.3 mg/dL (ref 0.3–1.2)
Total Protein: 6.9 g/dL (ref 6.0–8.3)

## 2011-12-07 LAB — TSH: TSH: 1.09 u[IU]/mL (ref 0.35–5.50)

## 2011-12-13 ENCOUNTER — Ambulatory Visit (INDEPENDENT_AMBULATORY_CARE_PROVIDER_SITE_OTHER): Payer: Managed Care, Other (non HMO) | Admitting: Family Medicine

## 2011-12-13 ENCOUNTER — Encounter: Payer: Self-pay | Admitting: Family Medicine

## 2011-12-13 VITALS — BP 111/73 | HR 82 | Temp 97.4°F | Ht 62.0 in | Wt 174.1 lb

## 2011-12-13 DIAGNOSIS — M79641 Pain in right hand: Secondary | ICD-10-CM | POA: Insufficient documentation

## 2011-12-13 DIAGNOSIS — Z23 Encounter for immunization: Secondary | ICD-10-CM

## 2011-12-13 DIAGNOSIS — Z0000 Encounter for general adult medical examination without abnormal findings: Secondary | ICD-10-CM

## 2011-12-13 DIAGNOSIS — E663 Overweight: Secondary | ICD-10-CM

## 2011-12-13 DIAGNOSIS — M79642 Pain in left hand: Secondary | ICD-10-CM | POA: Insufficient documentation

## 2011-12-13 DIAGNOSIS — D649 Anemia, unspecified: Secondary | ICD-10-CM

## 2011-12-13 DIAGNOSIS — J309 Allergic rhinitis, unspecified: Secondary | ICD-10-CM

## 2011-12-13 DIAGNOSIS — M79609 Pain in unspecified limb: Secondary | ICD-10-CM

## 2011-12-13 DIAGNOSIS — R030 Elevated blood-pressure reading, without diagnosis of hypertension: Secondary | ICD-10-CM

## 2011-12-13 DIAGNOSIS — K219 Gastro-esophageal reflux disease without esophagitis: Secondary | ICD-10-CM

## 2011-12-13 MED ORDER — PANTOPRAZOLE SODIUM 40 MG PO TBEC: 40.0000 mg | DELAYED_RELEASE_TABLET | Freq: Every day | ORAL | Status: DC

## 2011-12-13 MED ORDER — TETANUS-DIPHTH-ACELL PERTUSSIS 5-2.5-18.5 LF-MCG/0.5 IM SUSP: 0.5000 mL | Freq: Once | INTRAMUSCULAR | Status: DC

## 2011-12-13 NOTE — Patient Instructions (Signed)
Carpal Tunnel Syndrome The carpal tunnel is a narrow hollow area in the wrist. It is formed by the wrist bones and ligaments. Nerves, blood vessels, and tendons (cord like structures which attach muscle to bone) on the palm side (the side of your hand in the direction your fingers bend) of your hand pass through the carpal tunnel. Repeated wrist motion or certain diseases may cause swelling within the tunnel. (That is why these are called repetitive trauma (damage caused by over use) disorders. It is also a common problem in late pregnancy.) This swelling pinches the main nerve in the wrist (median nerve) and causes the painful condition called carpal tunnel syndrome. A feeling of "pins and needles" may be noticed in the fingers or hand; however, the entire arm may ache from this condition. Carpal tunnel syndrome may clear up by itself. Cortisone injections may help. Sometimes, an operation may be needed to free the pinched nerve. An electromyogram (a type of test) may be needed to confirm this diagnosis (learning what is wrong). This is a test which measures nerve conduction. The nerve conduction is usually slowed in a carpal tunnel syndrome. HOME CARE INSTRUCTIONS   If your caregiver prescribed medication to help reduce swelling, take as directed.   If you were given a splint to keep your wrist from bending, use it as instructed. It is important to wear the splint at night. Use the splint for as long as you have pain or numbness in your hand, arm or wrist. This may take 1 to 2 months.   If you have pain at night, it may help to rub or shake your hand, or elevate your hand above the level of your heart (the center of your chest).   It is important to give your wrist a rest by stopping the activities that are causing the problem. If your symptoms (problems) are work-related, you may need to talk to your employer about changing to a job that does not require using your wrist.   Only take over-the-counter  or prescription medicines for pain, discomfort, or fever as directed by your caregiver.   Following periods of extended use, particularly strenuous use, apply an ice pack wrapped in a towel to the anterior (palm) side of the affected wrist for 20 to 30 minutes. Repeat as needed three to four times per day. This will help reduce the swelling.   Follow all instructions for follow-up with your caregiver. This includes any orthopedic referrals, physical therapy, and rehabilitation. Any delay in obtaining necessary care could result in a delay or failure of your condition to heal.  SEEK IMMEDIATE MEDICAL CARE IF:   You are still having pain and numbness following a week of treatment.   You develop new, unexplained symptoms.   Your current symptoms are getting worse and are not helped or controlled with medications.  MAKE SURE YOU:   Understand these instructions.   Will watch your condition.   Will get help right away if you are not doing well or get worse.  Document Released: 08/26/2000 Document Revised: 08/18/2011 Document Reviewed: 07/15/2011 [facility] Patient Information 2012 [place], [place].  Start MegaRed caps daily, try icing and Voltaren gel to painful areas of hands once to twice daily

## 2011-12-13 NOTE — Assessment & Plan Note (Signed)
Well controlled today, consider DASH diet

## 2011-12-13 NOTE — Progress Notes (Signed)
Patient ID: Breanna Bridges, female   DOB: 05-24-1956, 56 y.o.   MRN: [id number] ANJALEE COPE [id number] 1956/04/24 12/13/2011      Progress Note New Patient  Subjective  Chief Complaint  Chief Complaint  Patient presents with  . Annual Exam    physical    HPI  Patient is a 56 year old female in today for annual exam overall she is doing well. Back in June she stopped oral contraceptives and developed bad hot flashes. Was placed on a hormone replacement and had even worse hot flashes so she stopped all her. Now she is having only minimal hot flashes she says are tolerable. She follows with Dr. Hyacinth Meeker of OB/GYN. She follows with Dr. Otelia Sergeant for her neck pain and is being using Meloxicam and Cyclobenzaprine with good effect. Some troubles her hands also noted. She describes stiffness, weakness and pain in both hands in the morning. He improved throughout the day. She describes a drawing up of her left ring finger as well. Also complains of pain most notably over the last some days. She's been under a great deal of stress with her husband having a back operation but has tolerated it well. After stopping the hormone she had weight gain but then start weight watchers and is brought back down from 181 to 174 today. She denies any acute complaints. She's not had any recent illness, fevers, chills, chest pain, palpitations, shortness of breath, GI or GU complaints. She did have a trip to the ER roughly a year ago after a fall and head injury but has had no persistent headaches or neurologic complaints since that time.  Past Medical History  Diagnosis Date  . Bilateral hand pain 12/13/2011  . Preventative health care 12/13/2011    Past Surgical History  Procedure Date  . Hammer toe repair 10+ yrs    left foot  . Leep 15+ yrs    normal paps since then  . Refractive surgery 5 yrs ago    b/l    Family History  Problem Relation Age of Onset  . Other Mother     CHF  . Diabetes Mother    Borderline  . Hypertension Mother   . Obesity Mother   . Anxiety disorder Mother   . Other Father     CHF  . Obesity Sister   . Diabetes Sister     type 2  . Hypertension Sister   . Anxiety disorder Sister   . Diverticulosis Sister   . Hyperlipidemia Sister   . Obesity Brother   . Anxiety disorder Brother   . Cancer Maternal Aunt     breast  . Cancer Paternal Aunt     Breast  . Obesity Paternal Grandmother   . Other Paternal Grandfather     Committed suicide during the [place] Depression  . Obesity Sister   . Anxiety disorder Sister   . Fibrocystic breast disease Sister   . Obesity Sister     History   Social History  . Marital Status: Married    Spouse Name: N/A    Number of Children: N/A  . Years of Education: N/A   Occupational History  . Not on file.   Social History Main Topics  . Smoking status: Former Smoker -- 0.2 packs/day    Quit date: 09/13/1983  . Smokeless tobacco: Not on file  . Alcohol Use: Yes  . Drug Use:   . Sexually Active: Yes   Other Topics Concern  .  Not on file   Social History Narrative  . No narrative on file    Current Outpatient Prescriptions on File Prior to Visit  Medication Sig Dispense Refill  . aspirin 81 MG tablet Take 81 mg by mouth daily.        . calcium carbonate 200 MG capsule Take 400 mg by mouth daily.        . cetirizine (ZYRTEC) 10 MG tablet Take 10 mg by mouth daily.        . cyclobenzaprine (FLEXERIL) 10 MG tablet Take 10 mg by mouth 2 (two) times daily as needed. For pain w/ food may cause sedation       . famciclovir (FAMVIR) 125 MG tablet Take 125 mg by mouth daily.        . fluticasone (FLONASE) 50 MCG/ACT nasal spray 1 spray by Nasal route daily. In spring, summer, fall       . meloxicam (MOBIC) 15 MG tablet Take 15 mg by mouth daily as needed. For pain- take with food       . multivitamin (THERAGRAN) per tablet Take 1 tablet by mouth daily.        Marland Kitchen venlafaxine (EFFEXOR-XR) 37.5 MG 24 hr capsule Take 37.5  mg by mouth daily.        Marland Kitchen DISCONTD: pantoprazole (PROTONIX) 40 MG tablet Take 40 mg by mouth daily.         No current facility-administered medications on file prior to visit.    Allergies  Allergen Reactions  . Codeine     REACTION: nausea    Review of Systems  Review of Systems  Constitutional: Negative for fever, chills and malaise/fatigue.  HENT: Negative for hearing loss, nosebleeds and congestion.   Eyes: Negative for discharge.  Respiratory: Negative for cough, sputum production, shortness of breath and wheezing.   Cardiovascular: Negative for chest pain, palpitations and leg swelling.  Gastrointestinal: Negative for heartburn, nausea, vomiting, abdominal pain, diarrhea, constipation and blood in stool.  Genitourinary: Negative for dysuria, urgency, frequency and hematuria.  Musculoskeletal: Negative for myalgias, back pain and falls.  Skin: Negative for rash.  Neurological: Negative for dizziness, tremors, sensory change, focal weakness, loss of consciousness, weakness and headaches.  Endo/Heme/Allergies: Negative for polydipsia. Does not bruise/bleed easily.  Psychiatric/Behavioral: Negative for depression and suicidal ideas. The patient is not nervous/anxious and does not have insomnia.     Objective  BP 111/73  Pulse 82  Temp(Src) 97.4 F (36.3 C) (Temporal)  Ht 5\' 2"  (1.575 m)  Wt 174 lb 1.9 oz (78.98 kg)  BMI 31.85 kg/m2  SpO2 98%  Physical Exam  Physical Exam  Constitutional: She is oriented to person, place, and time and well-developed, well-nourished, and in no distress. No distress.  HENT:  Head: Normocephalic and atraumatic.  Right Ear: External ear normal.  Left Ear: External ear normal.  Nose: Nose normal.  Mouth/Throat: Oropharynx is clear and moist. No oropharyngeal exudate.  Eyes: Conjunctivae are normal. Pupils are equal, round, and reactive to light. Right eye exhibits no discharge. Left eye exhibits no discharge. No scleral icterus.    Neck: Normal range of motion. Neck supple. No thyromegaly present.  Cardiovascular: Normal rate, regular rhythm, normal heart sounds and intact distal pulses.   Pulmonary/Chest: Effort normal and breath sounds normal. No respiratory distress. She has no wheezes. She has no rales.  Abdominal: Soft. Bowel sounds are normal. She exhibits no distension and no mass. There is no tenderness.  Musculoskeletal: Normal range of motion.  She exhibits no edema and no tenderness.  Lymphadenopathy:    She has no cervical adenopathy.  Neurological: She is alert and oriented to person, place, and time. She has normal reflexes. No cranial nerve deficit. Coordination normal.  Skin: Skin is warm and dry. No rash noted. She is not diaphoretic.  Psychiatric: Mood, memory and affect normal.       Assessment & Plan  UNSPECIFIED ANEMIA Mild, increase leafy greens, red meats, beans and recheck in 3-6 months, she had just given blodd the day before she came in to do her lab work  OVERWEIGHT Gained weight after stopping her hormones in June but has since joined [employer] and has lost back to 174. She is given  ALLERGIC RHINITIS Doing well at present may continue Cetirizine and Flonase prn  ELEVATED BP READING WITHOUT DX HYPERTENSION Well controlled today, consider DASH diet  Bilateral hand pain Describes stiffness, pain and weakness in both hands in am, better as the day progresses suggestive of Arthritis, also describes trigger finger in ring finger on left hand and some pain and tenderness over left thenar prominence in left hand, patient is left hand dominant. Also being treated for CTS in both hands by her orthopaedist, only wears the brace on the left hand qhs intermittently. Encouraged ice and voltaren gel to both hands and Carpal tunnel twice daily, start [employer] oil caps daily and may use Meloxicam as needed. Wear splints b/l as much as tolerated and follow up with ortho if symptoms  persist.  Preventative health care Given TDap today, follows with GYN and is up to date on colonoscopy

## 2011-12-13 NOTE — Assessment & Plan Note (Signed)
Mild, increase leafy greens, red meats, beans and recheck in 3-6 months, she had just given blodd the day before she came in to do her lab work

## 2011-12-13 NOTE — Assessment & Plan Note (Signed)
Doing well at present may continue Cetirizine and Flonase prn

## 2011-12-13 NOTE — Assessment & Plan Note (Signed)
Gained weight after stopping her hormones in June but has since joined [employer] and has lost back to 174. She is given

## 2011-12-13 NOTE — Assessment & Plan Note (Signed)
Describes stiffness, pain and weakness in both hands in am, better as the day progresses suggestive of Arthritis, also describes trigger finger in ring finger on left hand and some pain and tenderness over left thenar prominence in left hand, patient is left hand dominant. Also being treated for CTS in both hands by her orthopaedist, only wears the brace on the left hand qhs intermittently. Encouraged ice and voltaren gel to both hands and Carpal tunnel twice daily, start [employer] oil caps daily and may use Meloxicam as needed. Wear splints b/l as much as tolerated and follow up with ortho if symptoms persist.

## 2011-12-13 NOTE — Assessment & Plan Note (Signed)
Given TDap today, follows with GYN and is up to date on colonoscopy

## 2012-03-21 ENCOUNTER — Other Ambulatory Visit: Payer: Self-pay | Admitting: Obstetrics & Gynecology

## 2012-03-21 DIAGNOSIS — Z1231 Encounter for screening mammogram for malignant neoplasm of breast: Secondary | ICD-10-CM

## 2012-04-11 ENCOUNTER — Ambulatory Visit: Payer: Managed Care, Other (non HMO)

## 2012-04-11 ENCOUNTER — Ambulatory Visit
Admission: RE | Admit: 2012-04-11 | Discharge: 2012-04-11 | Disposition: A | Discharge status: Home / Self Care | Payer: Managed Care, Other (non HMO) | Source: Ambulatory Visit | Attending: Obstetrics & Gynecology | Admitting: Obstetrics & Gynecology

## 2012-04-11 DIAGNOSIS — Z1231 Encounter for screening mammogram for malignant neoplasm of breast: Secondary | ICD-10-CM

## 2012-09-25 ENCOUNTER — Ambulatory Visit: Payer: Managed Care, Other (non HMO)

## 2012-09-25 DIAGNOSIS — Z23 Encounter for immunization: Secondary | ICD-10-CM

## 2012-09-25 NOTE — Progress Notes (Signed)
  Subjective:    Patient ID: Breanna Bridges, female    DOB: Apr 03, 1956, 57 y.o.   MRN: [id number]  HPI    Review of Systems     Objective:   Physical Exam        Assessment & Plan:  Pt came in today for her Zoster Vaccine. Pt handled injection well.

## 2012-10-27 ENCOUNTER — Other Ambulatory Visit: Payer: Self-pay

## 2013-01-10 ENCOUNTER — Telehealth: Payer: Self-pay | Admitting: Family Medicine

## 2013-01-10 MED ORDER — PANTOPRAZOLE SODIUM 40 MG PO TBEC: 40.0000 mg | DELAYED_RELEASE_TABLET | Freq: Every day | ORAL | Status: DC

## 2013-01-10 NOTE — Telephone Encounter (Signed)
Refill- pantoprazole sod dr 40mg  tab. Take one tablet by mouth daily. Qty 90 date written 4.2.13

## 2013-04-08 ENCOUNTER — Encounter: Payer: Self-pay | Admitting: Obstetrics & Gynecology

## 2013-04-08 ENCOUNTER — Other Ambulatory Visit: Payer: Self-pay

## 2013-04-08 MED ORDER — FAMCICLOVIR 125 MG PO TABS: 125.0000 mg | ORAL_TABLET | Freq: Every day | ORAL | Status: DC

## 2013-04-08 NOTE — Telephone Encounter (Signed)
Patient has AEX scheduled for 04/09/13 and can get one year Rx then.

## 2013-04-09 ENCOUNTER — Ambulatory Visit (INDEPENDENT_AMBULATORY_CARE_PROVIDER_SITE_OTHER): Payer: Managed Care, Other (non HMO) | Admitting: Obstetrics & Gynecology

## 2013-04-09 ENCOUNTER — Ambulatory Visit: Payer: Self-pay | Admitting: Obstetrics & Gynecology

## 2013-04-09 ENCOUNTER — Encounter: Payer: Self-pay | Admitting: Obstetrics & Gynecology

## 2013-04-09 VITALS — BP 126/80 | HR 64 | Resp 16 | Ht 61.75 in | Wt 187.8 lb

## 2013-04-09 DIAGNOSIS — Z01419 Encounter for gynecological examination (general) (routine) without abnormal findings: Secondary | ICD-10-CM

## 2013-04-09 MED ORDER — FAMCICLOVIR 125 MG PO TABS: 125.0000 mg | ORAL_TABLET | Freq: Every day | ORAL | Status: DC

## 2013-04-09 NOTE — Progress Notes (Signed)
57 y.o. G0P0000 MarriedCaucasianF here for annual exam.  No vaginal bleeding.  Stopped patches.  Only having occasional hot flashes.    Patient's last menstrual period was 03/02/2011.          Sexually active: yes  The current method of family planning is none.    Exercising: yes  cardio-getting back into it Smoker:  no  Health Maintenance: Pap:  03/13/12 WNL/negative HR HPV History of abnormal Pap:  Yes, LEEP CIN 1, 1995 MMG:  04/12/12 3D normal Colonoscopy:  4/11 repeat in 10 years, Dr. Juanda Chance BMD:   none TDaP:  2013 Screening Labs: PCP, Hb today: PCP, Urine today: PCP.  Has appt 05/20/13 with Dr. Rogelia Rohrer.   reports that she quit smoking about 29 years ago. She has never used smokeless tobacco. She reports that  drinks alcohol. She reports that she does not use illicit drugs.  Past Medical History  Diagnosis Date  . Bilateral hand pain 12/13/2011  . Preventative health care 12/13/2011  . Depression     h/o depression/anxiety  . HSV infection   . Abnormal Pap smear     CIN I-LEEP  . GERD (gastroesophageal reflux disease)   . Stress fracture 2008    left foot    Past Surgical History  Procedure Laterality Date  . Hammer toe repair  10+ yrs    left foot  . Leep  15+ yrs    normal paps since then  . Refractive surgery  5 yrs ago    b/l    Current Outpatient Prescriptions  Medication Sig Dispense Refill  . aspirin 81 MG tablet Take 81 mg by mouth daily.        . calcium carbonate 200 MG capsule Take 400 mg by mouth daily.        . cetirizine (ZYRTEC) 10 MG tablet Take 10 mg by mouth daily.        . famciclovir (FAMVIR) 125 MG tablet Take 1 tablet (125 mg total) by mouth daily.  90 tablet  0  . fluticasone (FLONASE) 50 MCG/ACT nasal spray 1 spray by Nasal route daily. In spring, summer, fall       . meloxicam (MOBIC) 15 MG tablet Take 15 mg by mouth daily as needed. For pain- take with food       . multivitamin (THERAGRAN) per tablet Take 1 tablet by mouth daily.        . Omega-3  Fatty Acids (FISH OIL PO) Take by mouth.      . pantoprazole (PROTONIX) 40 MG tablet Take 1 tablet (40 mg total) by mouth daily.  90 tablet  3  . venlafaxine (EFFEXOR) 75 MG tablet Take 75 mg by mouth daily.       No current facility-administered medications for this visit.    Family History  Problem Relation Age of Onset  . Other Mother     CHF  . Diabetes Mother     Borderline  . Hypertension Mother   . Obesity Mother   . Anxiety disorder Mother   . Other Father     CHF  . Obesity Sister   . Diabetes Sister     type 2  . Hypertension Sister   . Anxiety disorder Sister   . Diverticulosis Sister   . Hyperlipidemia Sister   . Obesity Brother   . Anxiety disorder Brother   . Cancer Maternal Aunt     breast  . Cancer Paternal Aunt     Breast  .  Obesity Paternal Grandmother   . Other Paternal Grandfather     Committed suicide during the [place] Depression  . Obesity Sister   . Anxiety disorder Sister   . Fibrocystic breast disease Sister   . Obesity Sister     ROS:  Pertinent items are noted in HPI.  Otherwise, a comprehensive ROS was negative.  Exam:   BP 126/80  Pulse 64  Resp 16  Ht 5' 1.75" (1.568 m)  Wt 187 lb 12.8 oz (85.186 kg)  BMI 34.65 kg/m2  LMP 03/02/2011  Weight change: +11   Height: 5' 1.75" (156.8 cm)  Ht Readings from Last 3 Encounters:  04/09/13 5' 1.75" (1.568 m)  12/13/11 5\' 2"  (1.575 m)  11/23/10 5\' 2"  (1.575 m)    General appearance: alert, cooperative and appears stated age Head: Normocephalic, without obvious abnormality, atraumatic Neck: no adenopathy, supple, symmetrical, trachea midline and thyroid normal to inspection and palpation Lungs: clear to auscultation bilaterally Breasts: normal appearance, no masses or tenderness Heart: regular rate and rhythm Abdomen: soft, non-tender; bowel sounds normal; no masses,  no organomegaly Extremities: extremities normal, atraumatic, no cyanosis or edema Skin: Skin color, texture, turgor  normal. No rashes or lesions Lymph nodes: Cervical, supraclavicular, and axillary nodes normal. No abnormal inguinal nodes palpated Neurologic: Grossly normal   Pelvic: External genitalia:  no lesions              Urethra:  normal appearing urethra with no masses, tenderness or lesions              Bartholins and Skenes: normal                 Vagina: normal appearing vagina with normal color and discharge, no lesions              Cervix: no lesions              Pap taken: no Bimanual Exam:  Uterus:  normal size, contour, position, consistency, mobility, non-tender              Adnexa: normal adnexa and no mass, fullness, tenderness               Rectovaginal: Confirms               Anus:  normal sphincter tone, no lesions  A:  Well Woman with normal exam PMP, no HRT H/O depression/anxiety H/O HSV  P:   Mammogram yearly Famavir 125mg  q day.  #90/4 RF to pharmacy. pap smear with neg HR HPV 7/13 Labs with PCP. return annually or prn  An After Visit Summary was printed and given to the patient.

## 2013-04-09 NOTE — Patient Instructions (Signed)

## 2013-05-14 ENCOUNTER — Other Ambulatory Visit: Payer: Self-pay

## 2013-05-14 DIAGNOSIS — Z1231 Encounter for screening mammogram for malignant neoplasm of breast: Secondary | ICD-10-CM

## 2013-05-15 ENCOUNTER — Telehealth: Payer: Self-pay

## 2013-05-15 DIAGNOSIS — Z0000 Encounter for general adult medical examination without abnormal findings: Secondary | ICD-10-CM

## 2013-05-15 NOTE — Telephone Encounter (Signed)
Use V70 and check cbc, tsh, liver, renal, lipid

## 2013-05-15 NOTE — Telephone Encounter (Signed)
Please advise which labs need to be done. Pt came in today for labwork

## 2013-05-16 LAB — CBC
MCHC: 33.8 g/dL (ref 30.0–36.0)
RDW: 13.7 % (ref 11.5–15.5)

## 2013-05-16 LAB — RENAL FUNCTION PANEL
Albumin: 4.2 g/dL (ref 3.5–5.2)
Calcium: 9.2 mg/dL (ref 8.4–10.5)
Sodium: 140 mEq/L (ref 135–145)

## 2013-05-16 LAB — HEPATIC FUNCTION PANEL
Bilirubin, Direct: 0.1 mg/dL (ref 0.0–0.3)
Total Bilirubin: 0.4 mg/dL (ref 0.3–1.2)

## 2013-05-16 LAB — LIPID PANEL
Cholesterol: 206 mg/dL — ABNORMAL HIGH (ref 0–200)
Total CHOL/HDL Ratio: 2.3 Ratio
Triglycerides: 70 mg/dL (ref <150)
VLDL: 14 mg/dL (ref 0–40)

## 2013-05-16 LAB — TSH: TSH: 2.11 u[IU]/mL (ref 0.350–4.500)

## 2013-05-20 ENCOUNTER — Ambulatory Visit (INDEPENDENT_AMBULATORY_CARE_PROVIDER_SITE_OTHER): Payer: Managed Care, Other (non HMO) | Admitting: Family Medicine

## 2013-05-20 ENCOUNTER — Encounter: Payer: Self-pay | Admitting: Family Medicine

## 2013-05-20 VITALS — BP 122/82 | HR 91 | Temp 97.9°F | Ht 62.0 in | Wt 187.0 lb

## 2013-05-20 DIAGNOSIS — E782 Mixed hyperlipidemia: Secondary | ICD-10-CM | POA: Insufficient documentation

## 2013-05-20 DIAGNOSIS — F341 Dysthymic disorder: Secondary | ICD-10-CM

## 2013-05-20 DIAGNOSIS — Z0000 Encounter for general adult medical examination without abnormal findings: Secondary | ICD-10-CM

## 2013-05-20 DIAGNOSIS — E663 Overweight: Secondary | ICD-10-CM

## 2013-05-20 DIAGNOSIS — D649 Anemia, unspecified: Secondary | ICD-10-CM

## 2013-05-20 DIAGNOSIS — F329 Major depressive disorder, single episode, unspecified: Secondary | ICD-10-CM

## 2013-05-20 DIAGNOSIS — E785 Hyperlipidemia, unspecified: Secondary | ICD-10-CM

## 2013-05-20 DIAGNOSIS — F419 Anxiety disorder, unspecified: Secondary | ICD-10-CM

## 2013-05-20 DIAGNOSIS — R030 Elevated blood-pressure reading, without diagnosis of hypertension: Secondary | ICD-10-CM

## 2013-05-20 HISTORY — DX: Hyperlipidemia, unspecified: E78.5

## 2013-05-20 MED ORDER — ALPRAZOLAM 0.25 MG PO TABS: 0.2500 mg | ORAL_TABLET | Freq: Two times a day (BID) | ORAL | Status: DC | PRN

## 2013-05-20 MED ORDER — VENLAFAXINE HCL 75 MG PO TABS: 75.0000 mg | ORAL_TABLET | Freq: Every day | ORAL | Status: DC

## 2013-05-20 NOTE — Assessment & Plan Note (Addendum)
Encouraged heart healthy diet, regular exercise and 7-8 hours of sleep nightly. Filled out work forms today. Gets flu shots at work

## 2013-05-20 NOTE — Assessment & Plan Note (Signed)
Mild, avoid trans fats, increase exercise, minimize saturated fats and simple carbs. Krill oil daily

## 2013-05-20 NOTE — Progress Notes (Signed)
Patient ID: Breanna Bridges, female   DOB: 14-Oct-1955, 57 y.o.   MRN: [id number] BIANNEY ROCKWOOD [id number] July 23, 1956 05/20/2013      Progress Note-Follow Up  Subjective  Chief Complaint  Chief Complaint  Patient presents with  . Annual Exam    physical    HPI  She is a 57 year old Caucasian female who is in today for annual exam. Generally feeling well. She's had no recent illness. She she denies any fevers, chills, chest pain palpitations, shortness of breath, GU complaints. Has had some mild GI upset but it appears to have resolved. Does take a daily probiotic. She has not been exercising routinely like she once was. She's also stopped Weight Watchers and has been eating poorly. Recently had her GYN exam with Dr. Hyacinth Meeker. Take medications as prescribed. Is like to start getting her Effexor filled here. Gets just 10 alprazolam here for flying and would also like to get that here as well   Past Medical History  Diagnosis Date  . Depression     h/o depression/anxiety  . HSV infection   . Abnormal Pap smear     CIN I-LEEP  . GERD (gastroesophageal reflux disease)   . Stress fracture 2008    left foot  . Trigger finger of left hand     ring finger    Past Surgical History  Procedure Laterality Date  . Hammer toe repair  10+ yrs    left foot  . Leep  15+ yrs    normal paps since then  . Refractive surgery  5 yrs ago    b/l    Family History  Problem Relation Age of Onset  . Other Mother     CHF  . Diabetes Mother     Borderline  . Hypertension Mother   . Obesity Mother   . Anxiety disorder Mother   . Other Father     CHF  . Obesity Sister   . Diabetes Sister     type 2  . Hypertension Sister   . Anxiety disorder Sister   . Diverticulosis Sister   . Hyperlipidemia Sister   . Obesity Brother   . Anxiety disorder Brother   . Cancer Maternal Aunt     breast  . Cancer Paternal Aunt     Breast  . Obesity Paternal Grandmother   . Other Paternal Grandfather      Committed suicide during the [place] Depression  . Obesity Sister   . Anxiety disorder Sister   . Fibrocystic breast disease Sister   . Obesity Sister     History   Social History  . Marital Status: Married    Spouse Name: N/A    Number of Children: N/A  . Years of Education: N/A   Occupational History  . Not on file.   Social History Main Topics  . Smoking status: Former Smoker -- 0.25 packs/day    Quit date: 09/13/1983  . Smokeless tobacco: Never Used  . Alcohol Use: Yes     Comment: 1-2 per day  . Drug Use: No  . Sexual Activity: Yes    Partners: Male    Birth Control/ Protection: None   Other Topics Concern  . Not on file   Social History Narrative  . No narrative on file    Current Outpatient Prescriptions on File Prior to Visit  Medication Sig Dispense Refill  . aspirin 81 MG tablet Take 81 mg by mouth daily.        Marland Kitchen  calcium carbonate 200 MG capsule Take 400 mg by mouth daily.        . cetirizine (ZYRTEC) 10 MG tablet Take 10 mg by mouth daily.        . famciclovir (FAMVIR) 125 MG tablet Take 1 tablet (125 mg total) by mouth daily.  90 tablet  4  . fluticasone (FLONASE) 50 MCG/ACT nasal spray 1 spray by Nasal route daily. In spring, summer, fall       . meloxicam (MOBIC) 15 MG tablet Take 15 mg by mouth daily as needed. For pain- take with food       . multivitamin (THERAGRAN) per tablet Take 1 tablet by mouth daily.        . Omega-3 Fatty Acids (FISH OIL PO) Take by mouth.      . pantoprazole (PROTONIX) 40 MG tablet Take 1 tablet (40 mg total) by mouth daily.  90 tablet  3  . venlafaxine (EFFEXOR) 75 MG tablet Take 75 mg by mouth daily.       No current facility-administered medications on file prior to visit.    Allergies  Allergen Reactions  . Codeine     REACTION: nausea    Review of Systems  Review of Systems  Constitutional: Negative for fever, chills and malaise/fatigue.  HENT: Negative for hearing loss, nosebleeds and congestion.   Eyes:  Negative for discharge.  Respiratory: Negative for cough, sputum production, shortness of breath and wheezing.   Cardiovascular: Negative for chest pain, palpitations and leg swelling.  Gastrointestinal: Negative for heartburn, nausea, vomiting, abdominal pain, diarrhea, constipation and blood in stool.  Genitourinary: Negative for dysuria, urgency, frequency and hematuria.  Musculoskeletal: Negative for myalgias, back pain and falls.  Skin: Negative for rash.  Neurological: Negative for dizziness, tremors, sensory change, focal weakness, loss of consciousness, weakness and headaches.  Endo/Heme/Allergies: Negative for polydipsia. Does not bruise/bleed easily.  Psychiatric/Behavioral: Negative for depression and suicidal ideas. The patient is not nervous/anxious and does not have insomnia.     Objective  BP 122/82  Pulse 91  Temp(Src) 97.9 F (36.6 C) (Oral)  Ht 5\' 2"  (1.575 m)  Wt 187 lb 0.6 oz (84.841 kg)  BMI 34.2 kg/m2  SpO2 95%  Physical Exam  Physical Exam  Nursing note and vitals reviewed. Constitutional: She is oriented to person, place, and time and well-developed, well-nourished, and in no distress. No distress.  HENT:  Head: Normocephalic and atraumatic.  Right Ear: External ear normal.  Left Ear: External ear normal.  Nose: Nose normal.  Mouth/Throat: Oropharynx is clear and moist.  Eyes: Conjunctivae and EOM are normal. Pupils are equal, round, and reactive to light. Right eye exhibits no discharge. No scleral icterus.  Neck: Normal range of motion. Neck supple. No thyromegaly present.  Cardiovascular: Normal rate, regular rhythm and normal heart sounds.   No murmur heard. Pulmonary/Chest: Effort normal and breath sounds normal. She has no wheezes.  Abdominal: Soft. Bowel sounds are normal. She exhibits no distension and no mass. There is no tenderness. There is no rebound and no guarding.  Musculoskeletal: She exhibits no edema.  Lymphadenopathy:    She has no  cervical adenopathy.  Neurological: She is alert and oriented to person, place, and time. She has normal reflexes. No cranial nerve deficit. Coordination normal.  Skin: Skin is warm and dry. No rash noted. She is not diaphoretic.  Psychiatric: Memory, affect and judgment normal.    Lab Results  Component Value Date   TSH 2.110 05/16/2013  Lab Results  Component Value Date   WBC 3.1* 05/16/2013   HGB 12.6 05/16/2013   HCT 37.3 05/16/2013   MCV 86.1 05/16/2013   PLT 248 05/16/2013   Lab Results  Component Value Date   CREATININE 0.71 05/16/2013   BUN 15 05/16/2013   NA 140 05/16/2013   K 4.0 05/16/2013   CL 103 05/16/2013   CO2 25 05/16/2013   Lab Results  Component Value Date   ALT 12 05/16/2013   AST 16 05/16/2013   ALKPHOS 97 05/16/2013   BILITOT 0.4 05/16/2013   Lab Results  Component Value Date   CHOL 206* 05/16/2013   Lab Results  Component Value Date   HDL 88 05/16/2013   Lab Results  Component Value Date   LDLCALC 104* 05/16/2013   Lab Results  Component Value Date   TRIG 70 05/16/2013   Lab Results  Component Value Date   CHOLHDL 2.3 05/16/2013     Assessment & Plan  ELEVATED BP READING WITHOUT DX HYPERTENSION Numbers wnl  today  UNSPECIFIED ANEMIA Resolved with recent blood work  Other and unspecified hyperlipidemia Mild, avoid trans fats, increase exercise, minimize saturated fats and simple carbs. Krill oil daily  Preventative health care Encouraged heart healthy diet, regular exercise and 7-8 hours of sleep nightly. Filled out work forms today. Gets flu shots at work  Overweight(278.02) Economist with weight gain, encouraged DASH diet and regular exercise.

## 2013-05-20 NOTE — Assessment & Plan Note (Signed)
Resolved with recent blood work 

## 2013-05-20 NOTE — Assessment & Plan Note (Signed)
Numbers wnl  today

## 2013-05-20 NOTE — Patient Instructions (Addendum)
DASH Diet  The DASH diet stands for "Dietary Approaches to Stop Hypertension." It is a healthy eating plan that has been shown to reduce high blood pressure (hypertension) in as little as 14 days, while also possibly providing other significant health benefits. These other health benefits include reducing the risk of breast cancer after menopause and reducing the risk of type 2 diabetes, heart disease, colon cancer, and stroke. Health benefits also include weight loss and slowing kidney failure in patients with chronic kidney disease.   DIET GUIDELINES  · Limit salt (sodium). Your diet should contain less than 1500 mg of sodium daily.  · Limit refined or processed carbohydrates. Your diet should include mostly whole grains. Desserts and added sugars should be used sparingly.  · Include small amounts of heart-healthy fats. These types of fats include nuts, oils, and tub margarine. Limit saturated and trans fats. These fats have been shown to be harmful in the body.  CHOOSING FOODS   The following food groups are based on a 2000 calorie diet. See your Registered Dietitian for individual calorie needs.  Grains and Grain Products (6 to 8 servings daily)  · Eat More Often: Whole-wheat bread, brown rice, whole-grain or wheat pasta, quinoa, popcorn without added fat or salt (air popped).  · Eat Less Often: White bread, white pasta, white rice, cornbread.  Vegetables (4 to 5 servings daily)  · Eat More Often: Fresh, frozen, and canned vegetables. Vegetables may be raw, steamed, roasted, or grilled with a minimal amount of fat.  · Eat Less Often/Avoid: Creamed or fried vegetables. Vegetables in a cheese sauce.  Fruit (4 to 5 servings daily)  · Eat More Often: All fresh, canned (in natural juice), or frozen fruits. Dried fruits without added sugar. One hundred percent fruit juice (½ cup [237 mL] daily).  · Eat Less Often: Dried fruits with added sugar. Canned fruit in light or heavy syrup.  Lean Meats, Fish, and Poultry (2  servings or less daily. One serving is 3 to 4 oz [85-114 g]).  · Eat More Often: Ninety percent or leaner ground beef, tenderloin, sirloin. Round cuts of beef, chicken breast, turkey breast. All fish. Grill, bake, or broil your meat. Nothing should be fried.  · Eat Less Often/Avoid: Fatty cuts of meat, turkey, or chicken leg, thigh, or wing. Fried cuts of meat or fish.  Dairy (2 to 3 servings)  · Eat More Often: Low-fat or fat-free milk, low-fat plain or light yogurt, reduced-fat or part-skim cheese.  · Eat Less Often/Avoid: Milk (whole, 2%). Whole milk yogurt. Full-fat cheeses.  Nuts, Seeds, and Legumes (4 to 5 servings per week)  · Eat More Often: All without added salt.  · Eat Less Often/Avoid: Salted nuts and seeds, canned beans with added salt.  Fats and Sweets (limited)  · Eat More Often: Vegetable oils, tub margarines without trans fats, sugar-free gelatin. Mayonnaise and salad dressings.  · Eat Less Often/Avoid: Coconut oils, palm oils, butter, stick margarine, cream, half and half, cookies, candy, pie.  FOR MORE INFORMATION  The Dash Diet Eating Plan: www.dashdiet.org  Document Released: 08/18/2011 Document Revised: 11/21/2011 Document Reviewed: 08/18/2011  [employer]® Patient Information ©2014 [employer].

## 2013-05-20 NOTE — Assessment & Plan Note (Signed)
Frustrated with weight gain, encouraged DASH diet and regular exercise.

## 2013-05-28 ENCOUNTER — Ambulatory Visit: Payer: Managed Care, Other (non HMO)

## 2013-06-17 ENCOUNTER — Ambulatory Visit
Admission: RE | Admit: 2013-06-17 | Discharge: 2013-06-17 | Disposition: A | Discharge status: Home / Self Care | Payer: Managed Care, Other (non HMO) | Source: Ambulatory Visit

## 2013-06-17 DIAGNOSIS — Z1231 Encounter for screening mammogram for malignant neoplasm of breast: Secondary | ICD-10-CM

## 2013-08-23 ENCOUNTER — Other Ambulatory Visit: Payer: Self-pay | Admitting: Obstetrics & Gynecology

## 2013-08-23 ENCOUNTER — Telehealth: Payer: Self-pay | Admitting: Obstetrics & Gynecology

## 2013-08-23 MED ORDER — FAMCICLOVIR 125 MG PO TABS: 250.0000 mg | ORAL_TABLET | Freq: Two times a day (BID) | ORAL | Status: DC

## 2013-08-23 NOTE — Telephone Encounter (Signed)
Patient would like to increase dosage on Famvir.

## 2013-08-23 NOTE — Telephone Encounter (Signed)
Left patient detailed message on mobile voice mail per her request. Advised of message with medication change. Advised if any questions to feel free to call.

## 2013-08-23 NOTE — Progress Notes (Signed)
Dosage for suppression is 250mg  BID so that is what I sent to the pharmacy.  This is what I would recommend she take.

## 2013-08-23 NOTE — Telephone Encounter (Signed)
Patient having more flare ups of HSV "out of the blue" and was told at last annual to call back if continues. She would like to increase dosage.    Okay to leave detailed message on voicemail for return call.

## 2013-08-23 NOTE — Telephone Encounter (Signed)
Message left to return call to Luwanda Starr at [phone].    

## 2013-08-23 NOTE — Telephone Encounter (Signed)
Rx for 250mg  bid to pharmacy.  This is typical suppression dose.  Encounter closed.

## 2013-12-05 ENCOUNTER — Ambulatory Visit (INDEPENDENT_AMBULATORY_CARE_PROVIDER_SITE_OTHER): Payer: Managed Care, Other (non HMO) | Admitting: Physician Assistant

## 2013-12-05 ENCOUNTER — Encounter: Payer: Self-pay | Admitting: Physician Assistant

## 2013-12-05 VITALS — BP 122/74 | HR 103 | Temp 97.8°F | Resp 16 | Wt 184.0 lb

## 2013-12-05 DIAGNOSIS — J329 Chronic sinusitis, unspecified: Secondary | ICD-10-CM | POA: Insufficient documentation

## 2013-12-05 DIAGNOSIS — R059 Cough, unspecified: Secondary | ICD-10-CM

## 2013-12-05 DIAGNOSIS — R05 Cough: Secondary | ICD-10-CM

## 2013-12-05 MED ORDER — BENZONATATE 200 MG PO CAPS: 200.0000 mg | ORAL_CAPSULE | Freq: Three times a day (TID) | ORAL | Status: DC | PRN

## 2013-12-05 MED ORDER — AZITHROMYCIN 250 MG PO TABS: ORAL_TABLET | ORAL | Status: DC

## 2013-12-05 NOTE — Progress Notes (Signed)
Pre-visit discussion using our clinic review tool. No additional management support is needed unless otherwise documented below in the visit note.  

## 2013-12-05 NOTE — Assessment & Plan Note (Signed)
Rx Azithromycin.  Rx Tessalon.  Increase fluid intake.  Rest.  Saline nasal spray.  Probiotic.  Mucinex.  Humidifier in bedroom.

## 2013-12-05 NOTE — Progress Notes (Signed)
Patient presents to clinic today c/o sinus pressure, sinus pain, post-nasal drip, bilateral ear pain, and tooth pain.  Patient endorses mild cough and scratchy throat.  Denies shortness of breath, pleuritic chest pain or wheezing.  Denies fever.  Denies sick contact.  Denies foreign travel.  Past Medical History  Diagnosis Date  . Depression     h/o depression/anxiety  . HSV infection   . Abnormal Pap smear     CIN I-LEEP  . GERD (gastroesophageal reflux disease)   . Stress fracture 2008    left foot  . Trigger finger of left hand     ring finger  . Other and unspecified hyperlipidemia 05/20/2013    Current Outpatient Prescriptions on File Prior to Visit  Medication Sig Dispense Refill  . ALPRAZolam (XANAX) 0.25 MG tablet Take 1 tablet (0.25 mg total) by mouth 2 (two) times daily as needed for sleep or anxiety.  10 tablet  0  . aspirin 81 MG tablet Take 81 mg by mouth daily.        . calcium carbonate 200 MG capsule Take 400 mg by mouth daily.        . cetirizine (ZYRTEC) 10 MG tablet Take 10 mg by mouth daily.        . famciclovir (FAMVIR) 125 MG tablet Take 2 tablets (250 mg total) by mouth 2 (two) times daily.  180 tablet  3  . fluticasone (FLONASE) 50 MCG/ACT nasal spray 1 spray by Nasal route daily. In spring, summer, fall       . meloxicam (MOBIC) 15 MG tablet Take 15 mg by mouth daily as needed. For pain- take with food       . multivitamin (THERAGRAN) per tablet Take 1 tablet by mouth daily.        . Omega-3 Fatty Acids (FISH OIL PO) Take by mouth.      . pantoprazole (PROTONIX) 40 MG tablet Take 1 tablet (40 mg total) by mouth daily.  90 tablet  3  . Probiotic Product (PROBIOTIC DAILY PO) Take by mouth daily.      Marland Kitchen venlafaxine (EFFEXOR) 75 MG tablet Take 1 tablet (75 mg total) by mouth daily.  90 tablet  3   No current facility-administered medications on file prior to visit.    Allergies  Allergen Reactions  . Codeine     REACTION: nausea    Family History  Problem  Relation Age of Onset  . Other Mother     CHF  . Diabetes Mother     Borderline  . Hypertension Mother   . Obesity Mother   . Anxiety disorder Mother   . Other Father     CHF  . Obesity Sister   . Diabetes Sister     type 2  . Hypertension Sister   . Anxiety disorder Sister   . Diverticulosis Sister   . Hyperlipidemia Sister   . Obesity Brother   . Anxiety disorder Brother   . Cancer Maternal Aunt     breast  . Cancer Paternal Aunt     Breast  . Obesity Paternal Grandmother   . Other Paternal Grandfather     Committed suicide during the [place] Depression  . Obesity Sister   . Anxiety disorder Sister   . Fibrocystic breast disease Sister   . Obesity Sister     History   Social History  . Marital Status: Married    Spouse Name: N/A    Number of Children: N/A  .  Years of Education: N/A   Social History Main Topics  . Smoking status: Former Smoker -- 0.25 packs/day    Quit date: 09/13/1983  . Smokeless tobacco: Never Used  . Alcohol Use: Yes     Comment: 1-2 per day  . Drug Use: No  . Sexual Activity: Yes    Partners: Male    Birth Control/ Protection: None   Other Topics Concern  . None   Social History Narrative  . None   Review of Systems - See HPI.  All other ROS are negative.  BP 122/74  Pulse 103  Temp(Src) 97.8 F (36.6 C) (Oral)  Resp 16  Wt 184 lb (83.462 kg)  SpO2 97%  Physical Exam  Vitals reviewed. Constitutional: She is oriented to person, place, and time and well-developed, well-nourished, and in no distress.  HENT:  Head: Normocephalic and atraumatic.  Right Ear: External ear normal.  Left Ear: External ear normal.  Nose: Nose normal.  Mouth/Throat: Oropharynx is clear and moist. No oropharyngeal exudate.  TM within normal limits.  + TTP of sinuses  Eyes: Conjunctivae are normal. Pupils are equal, round, and reactive to light.  Neck: Neck supple.  Cardiovascular: Normal rate, regular rhythm, normal heart sounds and intact distal  pulses.   Pulmonary/Chest: Effort normal and breath sounds normal. No respiratory distress. She has no wheezes. She has no rales. She exhibits no tenderness.  Lymphadenopathy:    She has no cervical adenopathy.  Neurological: She is alert and oriented to person, place, and time.  Skin: Skin is warm and dry. No rash noted.  Psychiatric: Affect normal.    No results found for this or any previous visit (from the past 2160 hour(s)).  Assessment/Plan: Sinusitis Rx Azithromycin.  Rx Tessalon.  Increase fluid intake.  Rest.  Saline nasal spray.  Probiotic.  Mucinex.  Humidifier in bedroom.

## 2013-12-05 NOTE — Patient Instructions (Signed)
Take antibiotic as prescribed.  Increase fluid intake.  Use Tessalon Perles for cough.  Use Mucinex if needed for congestion.  Place a humidifier in the bedroom.  Call or return to clinic if symptoms are not improving.  Sinusitis Sinusitis is redness, soreness, and swelling (inflammation) of the paranasal sinuses. Paranasal sinuses are air pockets within the bones of your face (beneath the eyes, the middle of the forehead, or above the eyes). In healthy paranasal sinuses, mucus is able to drain out, and air is able to circulate through them by way of your nose. However, when your paranasal sinuses are inflamed, mucus and air can become trapped. This can allow bacteria and other germs to grow and cause infection. Sinusitis can develop quickly and last only a short time (acute) or continue over a long period (chronic). Sinusitis that lasts for more than 12 weeks is considered chronic.  CAUSES  Causes of sinusitis include:  Allergies.  Structural abnormalities, such as displacement of the cartilage that separates your nostrils (deviated septum), which can decrease the air flow through your nose and sinuses and affect sinus drainage.  Functional abnormalities, such as when the small hairs (cilia) that line your sinuses and help remove mucus do not work properly or are not present. SYMPTOMS  Symptoms of acute and chronic sinusitis are the same. The primary symptoms are pain and pressure around the affected sinuses. Other symptoms include:  Upper toothache.  Earache.  Headache.  Bad breath.  Decreased sense of smell and taste.  A cough, which worsens when you are lying flat.  Fatigue.  Fever.  Thick drainage from your nose, which often is green and may contain pus (purulent).  Swelling and warmth over the affected sinuses. DIAGNOSIS  Your caregiver will perform a physical exam. During the exam, your caregiver may:  Look in your nose for signs of abnormal growths in your nostrils  (nasal polyps).  Tap over the affected sinus to check for signs of infection.  View the inside of your sinuses (endoscopy) with a special imaging device with a light attached (endoscope), which is inserted into your sinuses. If your caregiver suspects that you have chronic sinusitis, one or more of the following tests may be recommended:  Allergy tests.  Nasal culture A sample of mucus is taken from your nose and sent to a lab and screened for bacteria.  Nasal cytology A sample of mucus is taken from your nose and examined by your caregiver to determine if your sinusitis is related to an allergy. TREATMENT  Most cases of acute sinusitis are related to a viral infection and will resolve on their own within 10 days. Sometimes medicines are prescribed to help relieve symptoms (pain medicine, decongestants, nasal steroid sprays, or saline sprays).  However, for sinusitis related to a bacterial infection, your caregiver will prescribe antibiotic medicines. These are medicines that will help kill the bacteria causing the infection.  Rarely, sinusitis is caused by a fungal infection. In theses cases, your caregiver will prescribe antifungal medicine. For some cases of chronic sinusitis, surgery is needed. Generally, these are cases in which sinusitis recurs more than 3 times per year, despite other treatments. HOME CARE INSTRUCTIONS   Drink plenty of water. Water helps thin the mucus so your sinuses can drain more easily.  Use a humidifier.  Inhale steam 3 to 4 times a day (for example, sit in the bathroom with the shower running).  Apply a warm, moist washcloth to your face 3 to 4 times  a day, or as directed by your caregiver.  Use saline nasal sprays to help moisten and clean your sinuses.  Take over-the-counter or prescription medicines for pain, discomfort, or fever only as directed by your caregiver. SEEK IMMEDIATE MEDICAL CARE IF:  You have increasing pain or severe headaches.  You  have nausea, vomiting, or drowsiness.  You have swelling around your face.  You have vision problems.  You have a stiff neck.  You have difficulty breathing. MAKE SURE YOU:   Understand these instructions.  Will watch your condition.  Will get help right away if you are not doing well or get worse. Document Released: 08/29/2005 Document Revised: 11/21/2011 Document Reviewed: 09/13/2011 [facility] Patient Information 2014 [place], [place].

## 2013-12-09 ENCOUNTER — Telehealth: Payer: Self-pay | Admitting: Family Medicine

## 2013-12-09 MED ORDER — AMOXICILLIN-POT CLAVULANATE 875-125 MG PO TABS: 1.0000 | ORAL_TABLET | Freq: Two times a day (BID) | ORAL | Status: DC

## 2013-12-09 NOTE — Telephone Encounter (Signed)
Per VO, WCM, Ok to send Augmentin BID x10 days, continue Benzonatate & take Mucinex DM; pt informed, understood/SLS

## 2013-12-09 NOTE — Telephone Encounter (Signed)
Patient states that she saw Breanna Bridges last Thursday and was prescribed a z-pack. She says that she is not feeling any better. She says that she is now feeling worse and would like to know what else she should do?

## 2014-02-04 ENCOUNTER — Other Ambulatory Visit: Payer: Self-pay | Admitting: Family Medicine

## 2014-04-15 HISTORY — PX: FOOT SURGERY: SHX648

## 2014-06-11 ENCOUNTER — Other Ambulatory Visit: Payer: Self-pay

## 2014-06-11 MED ORDER — FAMCICLOVIR 125 MG PO TABS: 250.0000 mg | ORAL_TABLET | Freq: Two times a day (BID) | ORAL | Status: DC

## 2014-06-11 NOTE — Telephone Encounter (Signed)
Last AEX: 04/09/13 Last refill:08/23/13 #180 X 3 Current AEX:06/19/14  Please advise (Dr. Sabra Heck Pt)

## 2014-06-19 ENCOUNTER — Ambulatory Visit (INDEPENDENT_AMBULATORY_CARE_PROVIDER_SITE_OTHER): Payer: Managed Care, Other (non HMO) | Admitting: Obstetrics & Gynecology

## 2014-06-19 ENCOUNTER — Encounter: Payer: Self-pay | Admitting: Obstetrics & Gynecology

## 2014-06-19 VITALS — BP 102/64 | HR 64 | Resp 16 | Ht 61.5 in | Wt 185.8 lb

## 2014-06-19 DIAGNOSIS — Z01419 Encounter for gynecological examination (general) (routine) without abnormal findings: Secondary | ICD-10-CM

## 2014-06-19 DIAGNOSIS — Z124 Encounter for screening for malignant neoplasm of cervix: Secondary | ICD-10-CM

## 2014-06-19 MED ORDER — ESTRADIOL 0.1 MG/GM VA CREA: TOPICAL_CREAM | VAGINAL | Status: DC

## 2014-06-19 MED ORDER — FAMCICLOVIR 125 MG PO TABS: 250.0000 mg | ORAL_TABLET | Freq: Two times a day (BID) | ORAL | Status: DC

## 2014-06-19 NOTE — Progress Notes (Signed)
58 y.o. G0P0000 MarriedCaucasianF here for annual exam.  Doing well.  Had a bone spur on left foot that was removed.  Finally released from care and can return to exercise now.  PCP: Dr. Charlett Blake.  Labs will be done with her.  Pt denies VB.  Biggest issue with vaginal dryness and pain with intercourse.  Patient's last menstrual period was 03/02/2011.          Sexually active: Yes.    The current method of family planning is none.    Exercising: No.  not since Spring when she had foot surgery Smoker:  Former smoker  Health Maintenance: Pap:  03/13/12 WNL/negative HR HPV History of abnormal Pap:  Yes h/o CIN I-LEEP MMG:  06/17/13-normal Colonoscopy:  2008-repeat in 10 years BMD:   none TDaP:  12/13/11 Screening Labs: at work, Hb today: PCP, Urine today: PCP   reports that she quit smoking about 30 years ago. She has never used smokeless tobacco. She reports that she drinks about 5 ounces of alcohol per week. She reports that she does not use illicit drugs.  Past Medical History  Diagnosis Date  . Depression     h/o depression/anxiety  . HSV infection   . Abnormal Pap smear     CIN I-LEEP  . GERD (gastroesophageal reflux disease)   . Stress fracture 2008    left foot  . Trigger finger of left hand     ring finger  . Other and unspecified hyperlipidemia 05/20/2013    Past Surgical History  Procedure Laterality Date  . Hammer toe repair  10+ yrs    left foot  . Leep  15+ yrs    normal paps since then  . Refractive surgery  5 yrs ago    b/l  . Foot surgery  04/15/14    left foot-bone spur    Current Outpatient Prescriptions  Medication Sig Dispense Refill  . ALPRAZolam (XANAX) 0.25 MG tablet Take 1 tablet (0.25 mg total) by mouth 2 (two) times daily as needed for sleep or anxiety.  10 tablet  0  . aspirin 81 MG tablet Take 81 mg by mouth daily.        . calcium carbonate 200 MG capsule Take 400 mg by mouth daily.        . cetirizine (ZYRTEC) 10 MG tablet Take 10 mg by mouth daily.         . famciclovir (FAMVIR) 125 MG tablet Take 2 tablets (250 mg total) by mouth 2 (two) times daily.  60 tablet  0  . fluticasone (FLONASE) 50 MCG/ACT nasal spray 1 spray by Nasal route daily. In spring, summer, fall       . meloxicam (MOBIC) 15 MG tablet Take 15 mg by mouth daily as needed. For pain- take with food       . multivitamin (THERAGRAN) per tablet Take 1 tablet by mouth daily.        . Omega-3 Fatty Acids (FISH OIL PO) Take by mouth.      . pantoprazole (PROTONIX) 40 MG tablet TAKE 1 TABLET BY MOUTH EVERY DAY  90 tablet  1  . Probiotic Product (PROBIOTIC DAILY PO) Take by mouth daily.      Marland Kitchen venlafaxine (EFFEXOR) 75 MG tablet Take 1 tablet (75 mg total) by mouth daily.  90 tablet  3   No current facility-administered medications for this visit.    Family History  Problem Relation Age of Onset  . Other Mother  CHF  . Diabetes Mother     Borderline  . Hypertension Mother   . Obesity Mother   . Anxiety disorder Mother   . Other Father     CHF  . Obesity Sister   . Diabetes Sister     type 2  . Hypertension Sister   . Anxiety disorder Sister   . Diverticulosis Sister   . Hyperlipidemia Sister   . Obesity Brother   . Anxiety disorder Brother   . Cancer Maternal Aunt     breast  . Cancer Paternal Aunt     Breast  . Obesity Paternal Grandmother   . Other Paternal Grandfather     Committed suicide during the [place] Depression  . Obesity Sister   . Anxiety disorder Sister   . Fibrocystic breast disease Sister   . Obesity Sister     ROS:  Pertinent items are noted in HPI.  Otherwise, a comprehensive ROS was negative.  Exam:   BP 102/64  Pulse 64  Resp 16  Ht 5' 1.5" (1.562 m)  Wt 185 lb 12.8 oz (84.278 kg)  BMI 34.54 kg/m2  LMP 03/02/2011   Height: 5' 1.5" (156.2 cm)  Ht Readings from Last 3 Encounters:  06/19/14 5' 1.5" (1.562 m)  05/20/13 5\' 2"  (1.575 m)  04/09/13 5' 1.75" (1.568 m)    General appearance: alert, cooperative and appears stated  age Head: Normocephalic, without obvious abnormality, atraumatic Neck: no adenopathy, supple, symmetrical, trachea midline and thyroid normal to inspection and palpation Lungs: clear to auscultation bilaterally Breasts: normal appearance, no masses or tenderness Heart: regular rate and rhythm Abdomen: soft, non-tender; bowel sounds normal; no masses,  no organomegaly Extremities: extremities normal, atraumatic, no cyanosis or edema Skin: Skin color, texture, turgor normal. No rashes or lesions Lymph nodes: Cervical, supraclavicular, and axillary nodes normal. No abnormal inguinal nodes palpated Neurologic: Grossly normal   Pelvic: External genitalia:  no lesions              Urethra:  normal appearing urethra with no masses, tenderness or lesions              Bartholins and Skenes: normal                 Vagina: normal appearing vagina with normal color and discharge, no lesions, atrophic changes              Cervix: no lesions              Pap taken: Yes.   Bimanual Exam:  Uterus:  normal size, contour, position, consistency, mobility, non-tender              Adnexa: normal adnexa and no mass, fullness, tenderness               Rectovaginal: Confirms               Anus:  normal sphincter tone, no lesions  A:  Well Woman with normal exam  PMP, no HRT  H/O depression/anxiety  H/O HSV  Vaginal atrophic changes  P: Mammogram yearly  Famavir 250mg  BID. #180/4 RF to pharmacy.  pap smear with neg HR HPV 7/13.  Pap only today.  Estrace vaginal cream 1 gm pv twice weekly #32.5g/2RF Labs with PCP  return annually or prn  An After Visit Summary was printed and given to the patient.

## 2014-06-20 LAB — IPS PAP TEST WITH REFLEX TO HPV

## 2014-07-02 ENCOUNTER — Other Ambulatory Visit: Payer: Self-pay

## 2014-07-02 NOTE — Telephone Encounter (Signed)
Incoming Refill Request from [facility] VO:[id number] 125mg   Last AEX:06/19/14 Last Refill:06/19/14 #180 X 4 Next AEX:07/30/15  Pt was given to new rx on 06/19/14. Pharmacy was trying to refill old rx.  Encounter closed

## 2014-07-14 ENCOUNTER — Encounter: Payer: Self-pay | Admitting: Obstetrics & Gynecology

## 2014-07-15 ENCOUNTER — Other Ambulatory Visit: Payer: Self-pay | Admitting: Obstetrics & Gynecology

## 2014-07-15 DIAGNOSIS — Z1231 Encounter for screening mammogram for malignant neoplasm of breast: Secondary | ICD-10-CM

## 2014-07-24 ENCOUNTER — Ambulatory Visit (HOSPITAL_COMMUNITY)
Admission: RE | Admit: 2014-07-24 | Discharge: 2014-07-24 | Disposition: A | Discharge status: Home / Self Care | Payer: Managed Care, Other (non HMO) | Source: Ambulatory Visit | Attending: Obstetrics & Gynecology | Admitting: Obstetrics & Gynecology

## 2014-07-24 DIAGNOSIS — Z1231 Encounter for screening mammogram for malignant neoplasm of breast: Secondary | ICD-10-CM | POA: Diagnosis not present

## 2014-08-11 ENCOUNTER — Telehealth: Payer: Self-pay | Admitting: Family Medicine

## 2014-08-11 DIAGNOSIS — F329 Major depressive disorder, single episode, unspecified: Secondary | ICD-10-CM

## 2014-08-11 DIAGNOSIS — F32A Depression, unspecified: Secondary | ICD-10-CM

## 2014-08-11 DIAGNOSIS — F419 Anxiety disorder, unspecified: Principal | ICD-10-CM

## 2014-08-11 MED ORDER — PANTOPRAZOLE SODIUM 40 MG PO TBEC: 40.0000 mg | DELAYED_RELEASE_TABLET | Freq: Every day | ORAL | Status: DC

## 2014-08-11 MED ORDER — VENLAFAXINE HCL 75 MG PO TABS: 75.0000 mg | ORAL_TABLET | Freq: Every day | ORAL | Status: DC

## 2014-08-11 NOTE — Telephone Encounter (Signed)
Patient scheduled her physical for 01/12/14, she is in need of refill on Effexor and protonix to [facility] [address]

## 2014-08-11 NOTE — Telephone Encounter (Signed)
Patient can have 1 month of refill but has to come in for a follow up before the medication is gone for medication management. Pt hasn't seen md since 04-2013. And I believe the physical is for 2016.

## 2014-08-13 ENCOUNTER — Other Ambulatory Visit: Payer: Self-pay | Admitting: Family Medicine

## 2014-08-18 NOTE — Telephone Encounter (Signed)
Left message with husband for patient to return my call

## 2014-09-05 ENCOUNTER — Other Ambulatory Visit: Payer: Self-pay | Admitting: Family Medicine

## 2014-09-08 NOTE — Telephone Encounter (Signed)
Rx sent to the pharmacy by e-script.//AB/CMA 

## 2014-09-25 NOTE — Telephone Encounter (Signed)
Patient states that her insurance will not cover 30 day supply at a time and is requesting 90 day supply with refills to last her until her appointment.   [facility] [address]

## 2014-09-26 NOTE — Telephone Encounter (Addendum)
Left message on pharmacy voice mail to change Rx for patient. Waited on hold for 6+minutes Patient's daughter notified.

## 2014-11-20 ENCOUNTER — Other Ambulatory Visit: Payer: Self-pay | Admitting: Orthopedic Surgery

## 2014-11-20 DIAGNOSIS — M79672 Pain in left foot: Secondary | ICD-10-CM

## 2014-11-25 ENCOUNTER — Ambulatory Visit
Admission: RE | Admit: 2014-11-25 | Discharge: 2014-11-25 | Disposition: A | Discharge status: Home / Self Care | Payer: Managed Care, Other (non HMO) | Source: Ambulatory Visit | Attending: Orthopedic Surgery | Admitting: Orthopedic Surgery

## 2014-11-25 DIAGNOSIS — M79672 Pain in left foot: Secondary | ICD-10-CM

## 2014-12-25 ENCOUNTER — Telehealth: Payer: Self-pay | Admitting: Family Medicine

## 2014-12-25 NOTE — Telephone Encounter (Signed)
Pre Visit Letter Sent °

## 2015-01-11 HISTORY — PX: FOOT SURGERY: SHX648

## 2015-01-12 ENCOUNTER — Telehealth: Payer: Self-pay | Admitting: *Deleted

## 2015-01-12 NOTE — Telephone Encounter (Signed)
Unable to reach patient at time of Pre-Visit Call.  Left message for patient to return call when available.    

## 2015-01-13 ENCOUNTER — Ambulatory Visit (INDEPENDENT_AMBULATORY_CARE_PROVIDER_SITE_OTHER): Payer: Managed Care, Other (non HMO) | Admitting: Family Medicine

## 2015-01-13 ENCOUNTER — Encounter: Payer: Self-pay | Admitting: Family Medicine

## 2015-01-13 VITALS — BP 130/82 | HR 90 | Temp 98.5°F | Ht 62.0 in | Wt 185.4 lb

## 2015-01-13 DIAGNOSIS — Z0000 Encounter for general adult medical examination without abnormal findings: Secondary | ICD-10-CM

## 2015-01-13 DIAGNOSIS — F329 Major depressive disorder, single episode, unspecified: Secondary | ICD-10-CM

## 2015-01-13 DIAGNOSIS — M722 Plantar fascial fibromatosis: Secondary | ICD-10-CM

## 2015-01-13 DIAGNOSIS — F418 Other specified anxiety disorders: Secondary | ICD-10-CM | POA: Diagnosis not present

## 2015-01-13 DIAGNOSIS — R030 Elevated blood-pressure reading, without diagnosis of hypertension: Secondary | ICD-10-CM | POA: Diagnosis not present

## 2015-01-13 DIAGNOSIS — L989 Disorder of the skin and subcutaneous tissue, unspecified: Secondary | ICD-10-CM

## 2015-01-13 DIAGNOSIS — E663 Overweight: Secondary | ICD-10-CM

## 2015-01-13 DIAGNOSIS — E782 Mixed hyperlipidemia: Secondary | ICD-10-CM

## 2015-01-13 DIAGNOSIS — F419 Anxiety disorder, unspecified: Secondary | ICD-10-CM

## 2015-01-13 DIAGNOSIS — J309 Allergic rhinitis, unspecified: Secondary | ICD-10-CM

## 2015-01-13 DIAGNOSIS — F32A Depression, unspecified: Secondary | ICD-10-CM

## 2015-01-13 MED ORDER — VENLAFAXINE HCL ER 75 MG PO CP24: 75.0000 mg | ORAL_CAPSULE | Freq: Every day | ORAL | Status: DC

## 2015-01-13 MED ORDER — FLUTICASONE PROPIONATE 50 MCG/ACT NA SUSP: 1.0000 | Freq: Every day | NASAL | Status: DC

## 2015-01-13 MED ORDER — PANTOPRAZOLE SODIUM 40 MG PO TBEC: 40.0000 mg | DELAYED_RELEASE_TABLET | Freq: Every day | ORAL | Status: DC

## 2015-01-13 MED ORDER — ALPRAZOLAM 0.25 MG PO TABS: 0.2500 mg | ORAL_TABLET | Freq: Two times a day (BID) | ORAL | Status: DC | PRN

## 2015-01-13 NOTE — Assessment & Plan Note (Signed)
Well controlled. Encouraged heart healthy diet such as the DASH diet and exercise as tolerated.  

## 2015-01-13 NOTE — Patient Instructions (Signed)
Preventive Care for Adults A healthy lifestyle and preventive care can promote health and wellness. Preventive health guidelines for women include the following key practices.  A routine yearly physical is a good way to check with your health care provider about your health and preventive screening. It is a chance to share any concerns and updates on your health and to receive a thorough exam.  Visit your dentist for a routine exam and preventive care every 6 months. Brush your teeth twice a day and floss once a day. Good oral hygiene prevents tooth decay and gum disease.  The frequency of eye exams is based on your age, health, family medical history, use of contact lenses, and other factors. Follow your health care provider's recommendations for frequency of eye exams.  Eat a healthy diet. Foods like vegetables, fruits, whole grains, low-fat dairy products, and lean protein foods contain the nutrients you need without too many calories. Decrease your intake of foods high in solid fats, added sugars, and salt. Eat the right amount of calories for you.Get information about a proper diet from your health care provider, if necessary.  Regular physical exercise is one of the most important things you can do for your health. Most adults should get at least 150 minutes of moderate-intensity exercise (any activity that increases your heart rate and causes you to sweat) each week. In addition, most adults need muscle-strengthening exercises on 2 or more days a week.  Maintain a healthy weight. The body mass index (BMI) is a screening tool to identify possible weight problems. It provides an estimate of body fat based on height and weight. Your health care provider can find your BMI and can help you achieve or maintain a healthy weight.For adults 20 years and older:  A BMI below 18.5 is considered underweight.  A BMI of 18.5 to 24.9 is normal.  A BMI of 25 to 29.9 is considered overweight.  A BMI of  30 and above is considered obese.  Maintain normal blood lipids and cholesterol levels by exercising and minimizing your intake of saturated fat. Eat a balanced diet with plenty of fruit and vegetables. Blood tests for lipids and cholesterol should begin at age 76 and be repeated every 5 years. If your lipid or cholesterol levels are high, you are over 50, or you are at high risk for heart disease, you may need your cholesterol levels checked more frequently.Ongoing high lipid and cholesterol levels should be treated with medicines if diet and exercise are not working.  If you smoke, find out from your health care provider how to quit. If you do not use tobacco, do not start.  Lung cancer screening is recommended for adults aged 22-80 years who are at high risk for developing lung cancer because of a history of smoking. A yearly low-dose CT scan of the lungs is recommended for people who have at least a 30-pack-year history of smoking and are a current smoker or have quit within the past 15 years. A pack year of smoking is smoking an average of 1 pack of cigarettes a day for 1 year (for example: 1 pack a day for 30 years or 2 packs a day for 15 years). Yearly screening should continue until the smoker has stopped smoking for at least 15 years. Yearly screening should be stopped for people who develop a health problem that would prevent them from having lung cancer treatment.  If you are pregnant, do not drink alcohol. If you are breastfeeding,  be very cautious about drinking alcohol. If you are not pregnant and choose to drink alcohol, do not have more than 1 drink per day. One drink is considered to be 12 ounces (355 mL) of beer, 5 ounces (148 mL) of wine, or 1.5 ounces (44 mL) of liquor.  Avoid use of street drugs. Do not share needles with anyone. Ask for help if you need support or instructions about stopping the use of drugs.  High blood pressure causes heart disease and increases the risk of  stroke. Your blood pressure should be checked at least every 1 to 2 years. Ongoing high blood pressure should be treated with medicines if weight loss and exercise do not work.  If you are 3-86 years old, ask your health care provider if you should take aspirin to prevent strokes.  Diabetes screening involves taking a blood sample to check your fasting blood sugar level. This should be done once every 3 years, after age 67, if you are within normal weight and without risk factors for diabetes. Testing should be considered at a younger age or be carried out more frequently if you are overweight and have at least 1 risk factor for diabetes.  Breast cancer screening is essential preventive care for women. You should practice "breast self-awareness." This means understanding the normal appearance and feel of your breasts and may include breast self-examination. Any changes detected, no matter how small, should be reported to a health care provider. Women in their 8s and 30s should have a clinical breast exam (CBE) by a health care provider as part of a regular health exam every 1 to 3 years. After age 70, women should have a CBE every year. Starting at age 25, women should consider having a mammogram (breast X-ray test) every year. Women who have a family history of breast cancer should talk to their health care provider about genetic screening. Women at a high risk of breast cancer should talk to their health care providers about having an MRI and a mammogram every year.  Breast cancer gene (BRCA)-related cancer risk assessment is recommended for women who have family members with BRCA-related cancers. BRCA-related cancers include breast, ovarian, tubal, and peritoneal cancers. Having family members with these cancers may be associated with an increased risk for harmful changes (mutations) in the breast cancer genes BRCA1 and BRCA2. Results of the assessment will determine the need for genetic counseling and  BRCA1 and BRCA2 testing.  Routine pelvic exams to screen for cancer are no longer recommended for nonpregnant women who are considered low risk for cancer of the pelvic organs (ovaries, uterus, and vagina) and who do not have symptoms. Ask your health care provider if a screening pelvic exam is right for you.  If you have had past treatment for cervical cancer or a condition that could lead to cancer, you need Pap tests and screening for cancer for at least 20 years after your treatment. If Pap tests have been discontinued, your risk factors (such as having a new sexual partner) need to be reassessed to determine if screening should be resumed. Some women have medical problems that increase the chance of getting cervical cancer. In these cases, your health care provider may recommend more frequent screening and Pap tests.  The HPV test is an additional test that may be used for cervical cancer screening. The HPV test looks for the virus that can cause the cell changes on the cervix. The cells collected during the Pap test can be  tested for HPV. The HPV test could be used to screen women aged 30 years and older, and should be used in women of any age who have unclear Pap test results. After the age of 30, women should have HPV testing at the same frequency as a Pap test.  Colorectal cancer can be detected and often prevented. Most routine colorectal cancer screening begins at the age of 50 years and continues through age 75 years. However, your health care provider may recommend screening at an earlier age if you have risk factors for colon cancer. On a yearly basis, your health care provider may provide home test kits to check for hidden blood in the stool. Use of a small camera at the end of a tube, to directly examine the colon (sigmoidoscopy or colonoscopy), can detect the earliest forms of colorectal cancer. Talk to your health care provider about this at age 50, when routine screening begins. Direct  exam of the colon should be repeated every 5-10 years through age 75 years, unless early forms of pre-cancerous polyps or small growths are found.  People who are at an increased risk for hepatitis B should be screened for this virus. You are considered at high risk for hepatitis B if:  You were born in a country where hepatitis B occurs often. Talk with your health care provider about which countries are considered high risk.  Your parents were born in a high-risk country and you have not received a shot to protect against hepatitis B (hepatitis B vaccine).  You have HIV or AIDS.  You use needles to inject street drugs.  You live with, or have sex with, someone who has hepatitis B.  You get hemodialysis treatment.  You take certain medicines for conditions like cancer, organ transplantation, and autoimmune conditions.  Hepatitis C blood testing is recommended for all people born from 1945 through 1965 and any individual with known risks for hepatitis C.  Practice safe sex. Use condoms and avoid high-risk sexual practices to reduce the spread of sexually transmitted infections (STIs). STIs include gonorrhea, chlamydia, syphilis, trichomonas, herpes, HPV, and human immunodeficiency virus (HIV). Herpes, HIV, and HPV are viral illnesses that have no cure. They can result in disability, cancer, and death.  You should be screened for sexually transmitted illnesses (STIs) including gonorrhea and chlamydia if:  You are sexually active and are younger than 24 years.  You are older than 24 years and your health care provider tells you that you are at risk for this type of infection.  Your sexual activity has changed since you were last screened and you are at an increased risk for chlamydia or gonorrhea. Ask your health care provider if you are at risk.  If you are at risk of being infected with HIV, it is recommended that you take a prescription medicine daily to prevent HIV infection. This is  called preexposure prophylaxis (PrEP). You are considered at risk if:  You are a heterosexual woman, are sexually active, and are at increased risk for HIV infection.  You take drugs by injection.  You are sexually active with a partner who has HIV.  Talk with your health care provider about whether you are at high risk of being infected with HIV. If you choose to begin PrEP, you should first be tested for HIV. You should then be tested every 3 months for as long as you are taking PrEP.  Osteoporosis is a disease in which the bones lose minerals and strength   with aging. This can result in serious bone fractures or breaks. The risk of osteoporosis can be identified using a bone density scan. Women ages 65 years and over and women at risk for fractures or osteoporosis should discuss screening with their health care providers. Ask your health care provider whether you should take a calcium supplement or vitamin D to reduce the rate of osteoporosis.  Menopause can be associated with physical symptoms and risks. Hormone replacement therapy is available to decrease symptoms and risks. You should talk to your health care provider about whether hormone replacement therapy is right for you.  Use sunscreen. Apply sunscreen liberally and repeatedly throughout the day. You should seek shade when your shadow is shorter than you. Protect yourself by wearing long sleeves, pants, a wide-brimmed hat, and sunglasses year round, whenever you are outdoors.  Once a month, do a whole body skin exam, using a mirror to look at the skin on your back. Tell your health care provider of new moles, moles that have irregular borders, moles that are larger than a pencil eraser, or moles that have changed in shape or color.  Stay current with required vaccines (immunizations).  Influenza vaccine. All adults should be immunized every year.  Tetanus, diphtheria, and acellular pertussis (Td, Tdap) vaccine. Pregnant women should  receive 1 dose of Tdap vaccine during each pregnancy. The dose should be obtained regardless of the length of time since the last dose. Immunization is preferred during the 27th-36th week of gestation. An adult who has not previously received Tdap or who does not know her vaccine status should receive 1 dose of Tdap. This initial dose should be followed by tetanus and diphtheria toxoids (Td) booster doses every 10 years. Adults with an unknown or incomplete history of completing a 3-dose immunization series with Td-containing vaccines should begin or complete a primary immunization series including a Tdap dose. Adults should receive a Td booster every 10 years.  Varicella vaccine. An adult without evidence of immunity to varicella should receive 2 doses or a second dose if she has previously received 1 dose. Pregnant females who do not have evidence of immunity should receive the first dose after pregnancy. This first dose should be obtained before leaving the health care facility. The second dose should be obtained 4-8 weeks after the first dose.  Human papillomavirus (HPV) vaccine. Females aged 13-26 years who have not received the vaccine previously should obtain the 3-dose series. The vaccine is not recommended for use in pregnant females. However, pregnancy testing is not needed before receiving a dose. If a female is found to be pregnant after receiving a dose, no treatment is needed. In that case, the remaining doses should be delayed until after the pregnancy. Immunization is recommended for any person with an immunocompromised condition through the age of 26 years if she did not get any or all doses earlier. During the 3-dose series, the second dose should be obtained 4-8 weeks after the first dose. The third dose should be obtained 24 weeks after the first dose and 16 weeks after the second dose.  Zoster vaccine. One dose is recommended for adults aged 60 years or older unless certain conditions are  present.  Measles, mumps, and rubella (MMR) vaccine. Adults born before 1957 generally are considered immune to measles and mumps. Adults born in 1957 or later should have 1 or more doses of MMR vaccine unless there is a contraindication to the vaccine or there is laboratory evidence of immunity to   each of the three diseases. A routine second dose of MMR vaccine should be obtained at least 28 days after the first dose for students attending postsecondary schools, health care workers, or international travelers. People who received inactivated measles vaccine or an unknown type of measles vaccine during 1963-1967 should receive 2 doses of MMR vaccine. People who received inactivated mumps vaccine or an unknown type of mumps vaccine before 1979 and are at high risk for mumps infection should consider immunization with 2 doses of MMR vaccine. For females of childbearing age, rubella immunity should be determined. If there is no evidence of immunity, females who are not pregnant should be vaccinated. If there is no evidence of immunity, females who are pregnant should delay immunization until after pregnancy. Unvaccinated health care workers born before 1957 who lack laboratory evidence of measles, mumps, or rubella immunity or laboratory confirmation of disease should consider measles and mumps immunization with 2 doses of MMR vaccine or rubella immunization with 1 dose of MMR vaccine.  Pneumococcal 13-valent conjugate (PCV13) vaccine. When indicated, a person who is uncertain of her immunization history and has no record of immunization should receive the PCV13 vaccine. An adult aged 19 years or older who has certain medical conditions and has not been previously immunized should receive 1 dose of PCV13 vaccine. This PCV13 should be followed with a dose of pneumococcal polysaccharide (PPSV23) vaccine. The PPSV23 vaccine dose should be obtained at least 8 weeks after the dose of PCV13 vaccine. An adult aged 19  years or older who has certain medical conditions and previously received 1 or more doses of PPSV23 vaccine should receive 1 dose of PCV13. The PCV13 vaccine dose should be obtained 1 or more years after the last PPSV23 vaccine dose.  Pneumococcal polysaccharide (PPSV23) vaccine. When PCV13 is also indicated, PCV13 should be obtained first. All adults aged 65 years and older should be immunized. An adult younger than age 65 years who has certain medical conditions should be immunized. Any person who resides in a nursing home or long-term care facility should be immunized. An adult smoker should be immunized. People with an immunocompromised condition and certain other conditions should receive both PCV13 and PPSV23 vaccines. People with human immunodeficiency virus (HIV) infection should be immunized as soon as possible after diagnosis. Immunization during chemotherapy or radiation therapy should be avoided. Routine use of PPSV23 vaccine is not recommended for American Indians, [place] Natives, or people younger than 65 years unless there are medical conditions that require PPSV23 vaccine. When indicated, people who have unknown immunization and have no record of immunization should receive PPSV23 vaccine. One-time revaccination 5 years after the first dose of PPSV23 is recommended for people aged 19-64 years who have chronic kidney failure, nephrotic syndrome, asplenia, or immunocompromised conditions. People who received 1-2 doses of PPSV23 before age 65 years should receive another dose of PPSV23 vaccine at age 65 years or later if at least 5 years have passed since the previous dose. Doses of PPSV23 are not needed for people immunized with PPSV23 at or after age 65 years.  Meningococcal vaccine. Adults with asplenia or persistent complement component deficiencies should receive 2 doses of quadrivalent meningococcal conjugate (MenACWY-D) vaccine. The doses should be obtained at least 2 months apart.  Microbiologists working with certain meningococcal bacteria, military recruits, people at risk during an outbreak, and people who travel to or live in countries with a high rate of meningitis should be immunized. A first-year college student up through age   21 years who is living in a residence hall should receive a dose if she did not receive a dose on or after her 16th birthday. Adults who have certain high-risk conditions should receive one or more doses of vaccine.  Hepatitis A vaccine. Adults who wish to be protected from this disease, have certain high-risk conditions, work with hepatitis A-infected animals, work in hepatitis A research labs, or travel to or work in countries with a high rate of hepatitis A should be immunized. Adults who were previously unvaccinated and who anticipate close contact with an international adoptee during the first 60 days after arrival in the [place] from a country with a high rate of hepatitis A should be immunized.  Hepatitis B vaccine. Adults who wish to be protected from this disease, have certain high-risk conditions, may be exposed to blood or other infectious body fluids, are household contacts or sex partners of hepatitis B positive people, are clients or workers in certain care facilities, or travel to or work in countries with a high rate of hepatitis B should be immunized.  Haemophilus influenzae type b (Hib) vaccine. A previously unvaccinated person with asplenia or sickle cell disease or having a scheduled splenectomy should receive 1 dose of Hib vaccine. Regardless of previous immunization, a recipient of a hematopoietic stem cell transplant should receive a 3-dose series 6-12 months after her successful transplant. Hib vaccine is not recommended for adults with HIV infection. Preventive Services / Frequency Ages 64 to 68 years  Blood pressure check.** / Every 1 to 2 years.  Lipid and cholesterol check.** / Every 5 years beginning at age  22.  Clinical breast exam.** / Every 3 years for women in their 88s and 53s.  BRCA-related cancer risk assessment.** / For women who have family members with a BRCA-related cancer (breast, ovarian, tubal, or peritoneal cancers).  Pap test.** / Every 2 years from ages 90 through 51. Every 3 years starting at age 21 through age 56 or 3 with a history of 3 consecutive normal Pap tests.  HPV screening.** / Every 3 years from ages 24 through ages 1 to 46 with a history of 3 consecutive normal Pap tests.  Hepatitis C blood test.** / For any individual with known risks for hepatitis C.  Skin self-exam. / Monthly.  Influenza vaccine. / Every year.  Tetanus, diphtheria, and acellular pertussis (Tdap, Td) vaccine.** / Consult your health care provider. Pregnant women should receive 1 dose of Tdap vaccine during each pregnancy. 1 dose of Td every 10 years.  Varicella vaccine.** / Consult your health care provider. Pregnant females who do not have evidence of immunity should receive the first dose after pregnancy.  HPV vaccine. / 3 doses over 6 months, if 72 and younger. The vaccine is not recommended for use in pregnant females. However, pregnancy testing is not needed before receiving a dose.  Measles, mumps, rubella (MMR) vaccine.** / You need at least 1 dose of MMR if you were born in 1957 or later. You may also need a 2nd dose. For females of childbearing age, rubella immunity should be determined. If there is no evidence of immunity, females who are not pregnant should be vaccinated. If there is no evidence of immunity, females who are pregnant should delay immunization until after pregnancy.  Pneumococcal 13-valent conjugate (PCV13) vaccine.** / Consult your health care provider.  Pneumococcal polysaccharide (PPSV23) vaccine.** / 1 to 2 doses if you smoke cigarettes or if you have certain conditions.  Meningococcal vaccine.** /  1 dose if you are age 19 to 21 years and a first-year college  student living in a residence hall, or have one of several medical conditions, you need to get vaccinated against meningococcal disease. You may also need additional booster doses.  Hepatitis A vaccine.** / Consult your health care provider.  Hepatitis B vaccine.** / Consult your health care provider.  Haemophilus influenzae type b (Hib) vaccine.** / Consult your health care provider. Ages 40 to 64 years  Blood pressure check.** / Every 1 to 2 years.  Lipid and cholesterol check.** / Every 5 years beginning at age 20 years.  Lung cancer screening. / Every year if you are aged 55-80 years and have a 30-pack-year history of smoking and currently smoke or have quit within the past 15 years. Yearly screening is stopped once you have quit smoking for at least 15 years or develop a health problem that would prevent you from having lung cancer treatment.  Clinical breast exam.** / Every year after age 40 years.  BRCA-related cancer risk assessment.** / For women who have family members with a BRCA-related cancer (breast, ovarian, tubal, or peritoneal cancers).  Mammogram.** / Every year beginning at age 40 years and continuing for as long as you are in good health. Consult with your health care provider.  Pap test.** / Every 3 years starting at age 30 years through age 65 or 70 years with a history of 3 consecutive normal Pap tests.  HPV screening.** / Every 3 years from ages 30 years through ages 65 to 70 years with a history of 3 consecutive normal Pap tests.  Fecal occult blood test (FOBT) of stool. / Every year beginning at age 50 years and continuing until age 75 years. You may not need to do this test if you get a colonoscopy every 10 years.  Flexible sigmoidoscopy or colonoscopy.** / Every 5 years for a flexible sigmoidoscopy or every 10 years for a colonoscopy beginning at age 50 years and continuing until age 75 years.  Hepatitis C blood test.** / For all people born from 1945 through  1965 and any individual with known risks for hepatitis C.  Skin self-exam. / Monthly.  Influenza vaccine. / Every year.  Tetanus, diphtheria, and acellular pertussis (Tdap/Td) vaccine.** / Consult your health care provider. Pregnant women should receive 1 dose of Tdap vaccine during each pregnancy. 1 dose of Td every 10 years.  Varicella vaccine.** / Consult your health care provider. Pregnant females who do not have evidence of immunity should receive the first dose after pregnancy.  Zoster vaccine.** / 1 dose for adults aged 60 years or older.  Measles, mumps, rubella (MMR) vaccine.** / You need at least 1 dose of MMR if you were born in 1957 or later. You may also need a 2nd dose. For females of childbearing age, rubella immunity should be determined. If there is no evidence of immunity, females who are not pregnant should be vaccinated. If there is no evidence of immunity, females who are pregnant should delay immunization until after pregnancy.  Pneumococcal 13-valent conjugate (PCV13) vaccine.** / Consult your health care provider.  Pneumococcal polysaccharide (PPSV23) vaccine.** / 1 to 2 doses if you smoke cigarettes or if you have certain conditions.  Meningococcal vaccine.** / Consult your health care provider.  Hepatitis A vaccine.** / Consult your health care provider.  Hepatitis B vaccine.** / Consult your health care provider.  Haemophilus influenzae type b (Hib) vaccine.** / Consult your health care provider. Ages 65   years and over  Blood pressure check.** / Every 1 to 2 years.  Lipid and cholesterol check.** / Every 5 years beginning at age 22 years.  Lung cancer screening. / Every year if you are aged 73-80 years and have a 30-pack-year history of smoking and currently smoke or have quit within the past 15 years. Yearly screening is stopped once you have quit smoking for at least 15 years or develop a health problem that would prevent you from having lung cancer  treatment.  Clinical breast exam.** / Every year after age 4 years.  BRCA-related cancer risk assessment.** / For women who have family members with a BRCA-related cancer (breast, ovarian, tubal, or peritoneal cancers).  Mammogram.** / Every year beginning at age 40 years and continuing for as long as you are in good health. Consult with your health care provider.  Pap test.** / Every 3 years starting at age 9 years through age 34 or 91 years with 3 consecutive normal Pap tests. Testing can be stopped between 65 and 70 years with 3 consecutive normal Pap tests and no abnormal Pap or HPV tests in the past 10 years.  HPV screening.** / Every 3 years from ages 57 years through ages 64 or 45 years with a history of 3 consecutive normal Pap tests. Testing can be stopped between 65 and 70 years with 3 consecutive normal Pap tests and no abnormal Pap or HPV tests in the past 10 years.  Fecal occult blood test (FOBT) of stool. / Every year beginning at age 15 years and continuing until age 17 years. You may not need to do this test if you get a colonoscopy every 10 years.  Flexible sigmoidoscopy or colonoscopy.** / Every 5 years for a flexible sigmoidoscopy or every 10 years for a colonoscopy beginning at age 86 years and continuing until age 71 years.  Hepatitis C blood test.** / For all people born from [birth year] through 1965 and any individual with known risks for hepatitis C.  Osteoporosis screening.** / A one-time screening for women ages 83 years and over and women at risk for fractures or osteoporosis.  Skin self-exam. / Monthly.  Influenza vaccine. / Every year.  Tetanus, diphtheria, and acellular pertussis (Tdap/Td) vaccine.** / 1 dose of Td every 10 years.  Varicella vaccine.** / Consult your health care provider.  Zoster vaccine.** / 1 dose for adults aged 61 years or older.  Pneumococcal 13-valent conjugate (PCV13) vaccine.** / Consult your health care provider.  Pneumococcal  polysaccharide (PPSV23) vaccine.** / 1 dose for all adults aged 28 years and older.  Meningococcal vaccine.** / Consult your health care provider.  Hepatitis A vaccine.** / Consult your health care provider.  Hepatitis B vaccine.** / Consult your health care provider.  Haemophilus influenzae type b (Hib) vaccine.** / Consult your health care provider. ** Family history and personal history of risk and conditions may change your health care provider's recommendations. Document Released: 10/25/2001 Document Revised: 01/13/2014 Document Reviewed: 01/24/2011 [facility] Patient Information 2015 [place], [place]. This information is not intended to replace advice given to you by your health care provider. Make sure you discuss any questions you have with your health care provider.

## 2015-01-13 NOTE — Progress Notes (Signed)
Pre visit review using our clinic review tool, if applicable. No additional management support is needed unless otherwise documented below in the visit note. 

## 2015-01-14 ENCOUNTER — Encounter: Payer: Self-pay | Admitting: Family Medicine

## 2015-01-18 ENCOUNTER — Encounter: Payer: Self-pay | Admitting: Family Medicine

## 2015-01-18 DIAGNOSIS — F418 Other specified anxiety disorders: Secondary | ICD-10-CM

## 2015-01-18 DIAGNOSIS — F32A Depression, unspecified: Secondary | ICD-10-CM | POA: Insufficient documentation

## 2015-01-18 HISTORY — DX: Other specified anxiety disorders: F41.8

## 2015-01-18 NOTE — Assessment & Plan Note (Signed)
Doing well on Venlafaxine but will switch to extended release form.

## 2015-01-18 NOTE — Assessment & Plan Note (Signed)
Encouraged heart healthy diet, increase exercise, avoid trans fats, consider a krill oil cap daily 

## 2015-01-18 NOTE — Assessment & Plan Note (Signed)
Patient encouraged to maintain heart healthy diet, regular exercise, adequate sleep. Consider daily probiotics. Take medications as prescribed. Continues to follow with gynecology.

## 2015-01-18 NOTE — Assessment & Plan Note (Signed)
Has surgery at [facility] with Dr Augustin Coupe tomorrow, she had a previous surgery with graft and screw but the bones did not fuse.

## 2015-01-18 NOTE — Assessment & Plan Note (Signed)
May use antihistmines dialy, flonase prn

## 2015-01-18 NOTE — Assessment & Plan Note (Signed)
Encouraged DASH diet, decrease po intake and increase exercise as tolerated. Needs 7-8 hours of sleep nightly. Avoid trans fats, eat small, frequent meals every 4-5 hours with lean proteins, complex carbs and healthy fats. Minimize simple carbs 

## 2015-01-18 NOTE — Progress Notes (Signed)
Breanna Bridges  [id number] October 20, 1955 01/18/2015      Progress Note-Follow Up  Subjective  Chief Complaint  Chief Complaint  Patient presents with  . Annual Exam    HPI  Patient is a 59 y.o. female in today for routine medical care. Patient is in today for annual exam. Struggling with ongoing left foot pain despite one attempt surgical correction. She had a bone graft and screw place, but there was never any fusion. She is scheduled to have her foot repaired again at [facility] tomorrow. No redness. No other recent illness or concerns. She continues to struggle with depression and anxiety but does believe venlafaxine and alprazolam has been helping her. Denies CP/palp/SOB/HA/congestion/fevers/GI or GU c/o. Taking meds as prescribed  Past Medical History  Diagnosis Date  . Depression     h/o depression/anxiety  . HSV infection   . Abnormal Pap smear     CIN I-LEEP  . GERD (gastroesophageal reflux disease)   . Stress fracture 2008    left foot  . Trigger finger of left hand     ring finger  . Other and unspecified hyperlipidemia 05/20/2013  . Depression with anxiety 01/18/2015    Past Surgical History  Procedure Laterality Date  . Hammer toe repair  10+ yrs    left foot  . Leep  15+ yrs    normal paps since then  . Refractive surgery  5 yrs ago    b/l  . Foot surgery  04/15/14    left foot-bone spur, Dr. Sharol Given    Family History  Problem Relation Age of Onset  . Other Mother     CHF  . Diabetes Mother     Borderline  . Hypertension Mother   . Obesity Mother   . Anxiety disorder Mother   . Other Father     CHF  . Obesity Sister   . Diabetes Sister     type 2  . Hypertension Sister   . Anxiety disorder Sister   . Diverticulosis Sister   . Hyperlipidemia Sister   . Obesity Brother   . Anxiety disorder Brother   . Cancer Maternal Aunt     breast  . Cancer Paternal Aunt     Breast  . Obesity Paternal Grandmother   . Other Paternal Grandfather    Committed suicide during the [place] Depression  . Obesity Sister   . Anxiety disorder Sister   . Fibrocystic breast disease Sister   . Obesity Sister     History   Social History  . Marital Status: Married    Spouse Name: N/A  . Number of Children: N/A  . Years of Education: N/A   Occupational History  . Not on file.   Social History Main Topics  . Smoking status: Former Smoker -- 0.25 packs/day    Quit date: 09/13/1983  . Smokeless tobacco: Never Used  . Alcohol Use: 5.0 oz/week    10 Standard drinks or equivalent per week  . Drug Use: No  . Sexual Activity:    Partners: Male    Patent examiner Protection: None     Comment: no dietary restrictions, lives with husband, 4 dogs   Other Topics Concern  . Not on file   Social History Narrative    Current Outpatient Prescriptions on File Prior to Visit  Medication Sig Dispense Refill  . aspirin 81 MG tablet Take 81 mg by mouth daily.      . calcium carbonate 200  MG capsule Take 400 mg by mouth daily.      . cetirizine (ZYRTEC) 10 MG tablet Take 10 mg by mouth daily.      Marland Kitchen estradiol (ESTRACE) 0.1 MG/GM vaginal cream 1 gram vaginally twice weekly 42.5 g 6  . famciclovir (FAMVIR) 125 MG tablet Take 2 tablets (250 mg total) by mouth 2 (two) times daily. Increase to 500mg  bid x 7 days with outbreaks 180 tablet 4  . meloxicam (MOBIC) 15 MG tablet Take 15 mg by mouth daily as needed. For pain- take with food     . multivitamin (THERAGRAN) per tablet Take 1 tablet by mouth daily.      . Omega-3 Fatty Acids (FISH OIL PO) Take by mouth.    . Probiotic Product (PROBIOTIC DAILY PO) Take by mouth daily.     No current facility-administered medications on file prior to visit.    Allergies  Allergen Reactions  . Codeine     REACTION: nausea    Review of Systems  Review of Systems  Constitutional: Negative for fever, chills and malaise/fatigue.  HENT: Negative for congestion, hearing loss and nosebleeds.   Eyes: Negative for  discharge.  Respiratory: Negative for cough, sputum production, shortness of breath and wheezing.   Cardiovascular: Negative for chest pain, palpitations and leg swelling.  Gastrointestinal: Negative for heartburn, nausea, vomiting, abdominal pain, diarrhea, constipation and blood in stool.  Genitourinary: Negative for dysuria, urgency, frequency and hematuria.  Musculoskeletal: Negative for myalgias, back pain and falls.  Skin: Negative for rash.  Neurological: Negative for dizziness, tremors, sensory change, focal weakness, loss of consciousness, weakness and headaches.  Endo/Heme/Allergies: Negative for polydipsia. Does not bruise/bleed easily.  Psychiatric/Behavioral: Positive for depression. Negative for suicidal ideas. The patient is not nervous/anxious and does not have insomnia.     Objective  BP 130/82 mmHg  Pulse 90  Temp(Src) 98.5 F (36.9 C) (Oral)  Ht 5\' 2"  (1.575 m)  Wt 185 lb 6 oz (84.086 kg)  BMI 33.90 kg/m2  SpO2 96%  LMP 03/02/2011  Physical Exam  Physical Exam  Constitutional: She is oriented to person, place, and time and well-developed, well-nourished, and in no distress. No distress.  HENT:  Head: Normocephalic and atraumatic.  Right Ear: External ear normal.  Left Ear: External ear normal.  Nose: Nose normal.  Mouth/Throat: Oropharynx is clear and moist. No oropharyngeal exudate.  Eyes: Conjunctivae are normal. Pupils are equal, round, and reactive to light. Right eye exhibits no discharge. Left eye exhibits no discharge. No scleral icterus.  Neck: Normal range of motion. Neck supple. No thyromegaly present.  Cardiovascular: Normal rate, regular rhythm, normal heart sounds and intact distal pulses.   No murmur heard. Pulmonary/Chest: Effort normal and breath sounds normal. No respiratory distress. She has no wheezes. She has no rales.  Abdominal: Soft. Bowel sounds are normal. She exhibits no distension and no mass. There is no tenderness.    Musculoskeletal: Normal range of motion. She exhibits no edema or tenderness.  Lymphadenopathy:    She has no cervical adenopathy.  Neurological: She is alert and oriented to person, place, and time. She has normal reflexes. No cranial nerve deficit. Coordination normal.  Skin: Skin is warm and dry. No rash noted. She is not diaphoretic.  Psychiatric: Mood, memory and affect normal.    Lab Results  Component Value Date   TSH 2.110 05/16/2013   Lab Results  Component Value Date   WBC 3.1* 05/16/2013   HGB 12.6 05/16/2013  HCT 37.3 05/16/2013   MCV 86.1 05/16/2013   PLT 248 05/16/2013   Lab Results  Component Value Date   CREATININE 0.71 05/16/2013   BUN 15 05/16/2013   NA 140 05/16/2013   K 4.0 05/16/2013   CL 103 05/16/2013   CO2 25 05/16/2013   Lab Results  Component Value Date   ALT 12 05/16/2013   AST 16 05/16/2013   ALKPHOS 97 05/16/2013   BILITOT 0.4 05/16/2013   Lab Results  Component Value Date   CHOL 206* 05/16/2013   Lab Results  Component Value Date   HDL 88 05/16/2013   Lab Results  Component Value Date   LDLCALC 104* 05/16/2013   Lab Results  Component Value Date   TRIG 70 05/16/2013   Lab Results  Component Value Date   CHOLHDL 2.3 05/16/2013     Assessment & Plan  ELEVATED BP READING WITHOUT DX HYPERTENSION Well controlled. Encouraged heart healthy diet such as the DASH diet and exercise as tolerated.    Allergic rhinitis May use antihistmines dialy, flonase prn   PLANTAR FASCIITIS, LEFT Has surgery at [facility] with Dr Augustin Coupe tomorrow, she had a previous surgery with graft and screw but the bones did not fuse.    Hyperlipidemia, mixed Encouraged heart healthy diet, increase exercise, avoid trans fats, consider a krill oil cap daily   Preventative health care Patient encouraged to maintain heart healthy diet, regular exercise, adequate sleep. Consider daily probiotics. Take medications as prescribed. Continues to follow with  gynecology.    Overweight Encouraged DASH diet, decrease po intake and increase exercise as tolerated. Needs 7-8 hours of sleep nightly. Avoid trans fats, eat small, frequent meals every 4-5 hours with lean proteins, complex carbs and healthy fats. Minimize simple carbs   Depression with anxiety Doing well on Venlafaxine but will switch to extended release form.

## 2015-02-02 ENCOUNTER — Encounter: Payer: Self-pay | Admitting: Internal Medicine

## 2015-06-22 ENCOUNTER — Telehealth: Payer: Self-pay | Admitting: Family Medicine

## 2015-06-22 MED ORDER — VENLAFAXINE HCL ER 75 MG PO CP24: 75.0000 mg | ORAL_CAPSULE | Freq: Every day | ORAL | Status: DC

## 2015-06-22 NOTE — Telephone Encounter (Signed)
Caller name:Glennie Relationship to patient:self Can be [phone] Pharmacy:[facility] [address]  Reason for call:--venlafaxine hcl er 75 mg takes 1 per day.  In order for her insurance to pay she has to have a 90 day supply

## 2015-06-22 NOTE — Telephone Encounter (Signed)
Refill done and patient informed 

## 2015-07-14 ENCOUNTER — Other Ambulatory Visit: Payer: Self-pay | Admitting: Obstetrics & Gynecology

## 2015-07-14 NOTE — Telephone Encounter (Signed)
RX done.  Duplicated Famvir RX removed from medication list.

## 2015-07-14 NOTE — Telephone Encounter (Signed)
Medication refill request: Famvir Last AEX:  06-19-14 Next AEX: 07-30-15 Last MMG (if hormonal medication request): 07-24-14 WNL Refill authorized: please advise

## 2015-07-20 ENCOUNTER — Ambulatory Visit: Payer: Managed Care, Other (non HMO) | Attending: Orthopaedic Surgery | Admitting: Rehabilitation

## 2015-07-20 ENCOUNTER — Encounter: Payer: Self-pay | Admitting: Rehabilitation

## 2015-07-20 DIAGNOSIS — M25572 Pain in left ankle and joints of left foot: Secondary | ICD-10-CM | POA: Diagnosis not present

## 2015-07-20 DIAGNOSIS — R262 Difficulty in walking, not elsewhere classified: Secondary | ICD-10-CM | POA: Diagnosis present

## 2015-07-20 NOTE — Therapy (Signed)
[facility] [address]  [place], [place], [address] Phone: [phone]   Fax:  [phone]  Physical Therapy Evaluation  Patient Details  Name: Breanna Bridges MRN: [id number] Date of Birth: 03/10/1956 Referring Provider: Dr. Kathi Simpers  Encounter Date: 07/20/2015      PT End of Session - 07/20/15 1157    Visit Number 1   Number of Visits 12   Date for PT Re-Evaluation 08/31/15   PT Start Time 1104   PT Stop Time 1153   PT Time Calculation (min) 49 min   Activity Tolerance Patient tolerated treatment well      Past Medical History  Diagnosis Date  . Depression     h/o depression/anxiety  . HSV infection   . Abnormal Pap smear     CIN I-LEEP  . GERD (gastroesophageal reflux disease)   . Stress fracture 2008    left foot  . Trigger finger of left hand     ring finger  . Other and unspecified hyperlipidemia 05/20/2013  . Depression with anxiety 01/18/2015    Past Surgical History  Procedure Laterality Date  . Hammer toe repair  10+ yrs    left foot  . Leep  15+ yrs    normal paps since then  . Refractive surgery  5 yrs ago    b/l  . Foot surgery  04/15/14    left foot-bone spur, Dr. Sharol Given    There were no vitals filed for this visit.  Visit Diagnosis:  Pain in joint, ankle and foot, left - Plan: PT plan of care cert/re-cert, CANCELED: PT plan of care cert/re-cert  Difficulty walking - Plan: PT plan of care cert/re-cert, CANCELED: PT plan of care cert/re-cert      Subjective Assessment - 07/20/15 1151    Subjective Pt presents s/p plate and screw placement in the L foot due to a bone spur removal and failure of the simple screw holding the fixation.  No limitations.  Was in cast x 2 months and a boot x 6 weeks.  Now just has orthotics in the shoes.  Pt reports that the surgery went well and she has no trouble at that site, but that she is now having medial parch and medial ankle pain as well  as problems with remaining stiffness.  She normally  likes to walk 10.000 steps per day but is not able to due to the pain.     Pertinent History significant history of problems with this foot from plantar fasciitis to surgery   Limitations Walking   How long can you walk comfortably? no more than 5-6,000 steps; would like to get 10,000 per day    Patient Stated Goals walk without pain   Currently in Pain? Yes   Pain Score 3    Pain Location Foot   Pain Orientation Left   Pain Descriptors / Indicators Aching   Pain Onset More than a month ago   Pain Frequency Constant   Aggravating Factors  walking   Pain Relieving Factors rest            [facility] PT Assessment - 07/20/15 0001    Assessment   Medical Diagnosis L foot pain    Referring Provider Dr. Kathi Simpers   Onset Date/Surgical Date 01/14/15   Next MD Visit as needed   Prior Therapy no   Precautions   Precautions None   Restrictions   Weight Bearing Restrictions No  Balance Screen   Has the patient fallen in the past 6 months No   Has the patient had a decrease in activity level because of a fear of falling?  No   Is the patient reluctant to leave their home because of a fear of falling?  No   Prior Function   Vocation Full time employment   Vocation Requirements mostly sitting   Observation/Other Assessments   Focus on Therapeutic Outcomes (FOTO)  51%   Observation/Other Assessments-Edema    Edema Circumferential   ROM / Strength   AROM / PROM / Strength AROM;PROM;Strength   AROM   AROM Assessment Site Ankle   Right/Left Ankle Left   Left Ankle Dorsiflexion 5   Left Ankle Plantar Flexion 35   Left Ankle Inversion 20   Left Ankle Eversion 4   PROM   PROM Assessment Site Ankle   Right/Left Ankle Left   Left Ankle Dorsiflexion 5   Left Ankle Plantar Flexion WNL   Left Ankle Inversion 20   Left Ankle Eversion 15   Strength   Strength Assessment Site Ankle   Right/Left Ankle Left;Right   Right Ankle  Dorsiflexion 5/5   Right Ankle Plantar Flexion 5/5   Right Ankle Inversion 5/5   Right Ankle Eversion 5/5   Left Ankle Dorsiflexion 5/5   Left Ankle Plantar Flexion 5/5   Left Ankle Inversion 5/5   Palpation   Palpation comment only ttp medial calf following posterior tibialis. overall stiffness AP at the L TC joint limiting stretch felt in calf, stiffness evident in medial arch   Ambulation/Gait   Gait Comments reports ambulation is slightly antalgic, pes planus bilaterally, decreased stance time   High Level Balance   High Level Balance Comments SL stance L with more sway compared to R                   [facility] Adult PT Treatment/Exercise - 07/20/15 0001    Manual Therapy   Manual therapy comments TC AP glides Grade IV 3x45" and manual PROM into DF                PT Education - 07/20/15 1157    Education provided Yes   Education Details HEP, diagnosis, POC   Person(s) Educated Patient   Methods Explanation;Demonstration;Handout   Comprehension Verbalized understanding;Returned demonstration          PT Short Term Goals - 07/20/15 1200    PT SHORT TERM GOAL #1   Title pt will be independent with initial HEP   Time 1   Period Weeks   Status New           PT Long Term Goals - 07/20/15 1200    PT LONG TERM GOAL #1   Title pt will be independent with final HEP   Time 4   Period Weeks   Status New   PT LONG TERM GOAL #2   Title pt will improve L ankle dorsiflexion to 10degrees or more   Time 6   Period Weeks   Status New   PT LONG TERM GOAL #3   Title pt will perform single leg stance on foam without increased sway compared to the well leg   Time 6   Period Weeks   Status New   PT LONG TERM GOAL #4   Title pt will resume walking 100000 steps per day without increased L foot pain   Time 6   Period Weeks   Status  New               Plan - 07/20/15 1158    Clinical Impression Statement Pt presents s/p plate and screw placement in  the L foot due to a bone spur removal and failure of the simple screw holding the fixation.  No limitations.  Was in cast x 2 months and a boot x 6 weeks.  Now just has orthotics in the shoes.  Pt reports that the surgery went well and she has no trouble at that site, but that she is now having medial parch and medial ankle pain as well as problems with remaining stiffness.  She normally  likes to walk 10.000 steps per day but is not able to due to the pain.  She presents with L ankle joint stiffness, decreased ROM, decreased proprioception, and antalgic gait   Pt will benefit from skilled therapeutic intervention in order to improve on the following deficits Abnormal gait;Decreased balance;Pain;Hypomobility;Decreased range of motion;Difficulty walking   Rehab Potential Excellent   PT Frequency 2x / week   PT Duration 6 weeks   PT Treatment/Interventions Aquatic Therapy;Electrical Stimulation;Moist Heat;Ultrasound;Therapeutic exercise;Patient/family education;Manual techniques;Passive range of motion   PT Next Visit Plan review HEP; begin L ankle ROM, propriception, gait, manual work to increase ROM, plantar fasciitis stretches   Consulted and Agree with Plan of Care Patient         Problem List Patient Active Problem List   Diagnosis Date Noted  . Depression with anxiety 01/18/2015  . Sinusitis 12/05/2013  . Hyperlipidemia, mixed 05/20/2013  . Bilateral hand pain 12/13/2011  . Preventative health care 12/13/2011  . SACROILIITIS, LEFT 11/23/2010  . Overweight 10/11/2010  . UNSPECIFIED ANEMIA 10/11/2010  . Allergic rhinitis 10/11/2010  . PLANTAR FASCIITIS, LEFT 10/11/2010  . ELEVATED BP READING WITHOUT DX HYPERTENSION 10/11/2010  . HEART MURMUR, HX OF 10/11/2010  . [place] Hard OF 10/11/2010    Stark Bray, DPT, CMP 07/20/2015, 12:11 PM  [facility] [address]  [place], [place], [address] Phone: [phone]   Fax:   [phone]  Name: Breanna Bridges MRN: [id number] Date of Birth: 11/11/1955

## 2015-07-23 ENCOUNTER — Encounter: Payer: Self-pay | Admitting: Rehabilitative and Restorative Service Providers"

## 2015-07-23 ENCOUNTER — Ambulatory Visit: Payer: Managed Care, Other (non HMO) | Admitting: Rehabilitative and Restorative Service Providers"

## 2015-07-23 DIAGNOSIS — R262 Difficulty in walking, not elsewhere classified: Secondary | ICD-10-CM

## 2015-07-23 DIAGNOSIS — M25572 Pain in left ankle and joints of left foot: Secondary | ICD-10-CM | POA: Diagnosis not present

## 2015-07-23 NOTE — Patient Instructions (Signed)
Knee [facility] a chair for balance, slowly bend knees. Keep both feet on the floor. 10 sec hold  Repeat __10__ times. Do _1-2___ sessions per day.    With Support    Stand on one leg in neutral spine holding support as needed . Hold __30__ seconds. Repeat on other leg. Do __3__ repetitions, can use pillow to stand on.   Hip Flexion / Hip Abduction    Standing on one leg, other knee flexed, move bent leg out to side, forward and back  and bring it back to touch.   Repeat on other leg. Do _10___ repetitions, _2-3___ sets.  HIP: Hamstrings - Supine    Place strap around foot. Raise leg up, keep knee straight. Hold __30_ seconds. 3___ reps per set 2-3 per day    Extensors / Rotators, Supine    Lie supine, one leg straight, other leg bent, knee held by opposite hand. Gently pull knee toward opposite shoulder. Feel stretch in buttocks and outside of hip. Hold _30__ seconds. Repeat _3_ times per session. Do __1-2_ sessions per day.

## 2015-07-23 NOTE — Therapy (Signed)
[facility] [address]  [place], [place], [address] Phone: [phone]   Fax:  [phone]  Physical Therapy Treatment  Patient Details  Name: Breanna Bridges MRN: [id number] Date of Birth: [birth year] Referring Provider: Dr. Kathi Simpers  Encounter Date: 07/23/2015      PT End of Session - 07/23/15 1323    Visit Number 2   Number of Visits 12   Date for PT Re-Evaluation 08/31/15   PT Start Time [id number]   PT Stop Time 1403   PT Time Calculation (min) 46 min   Activity Tolerance Patient tolerated treatment well      Past Medical History  Diagnosis Date  . Depression     h/o depression/anxiety  . HSV infection   . Abnormal Pap smear     CIN I-LEEP  . GERD (gastroesophageal reflux disease)   . Stress fracture 2008    left foot  . Trigger finger of left hand     ring finger  . Other and unspecified hyperlipidemia 05/20/2013  . Depression with anxiety 01/18/2015    Past Surgical History  Procedure Laterality Date  . Hammer toe repair  10+ yrs    left foot  . Leep  15+ yrs    normal paps since then  . Refractive surgery  5 yrs ago    b/l  . Foot surgery  04/15/14    left foot-bone spur, Dr. Sharol Given    There were no vitals filed for this visit.  Visit Diagnosis:  Pain in joint, ankle and foot, left  Difficulty walking      Subjective Assessment - 07/23/15 1315    Subjective Pt reports that she did okay during first  treatment but experienced significant increase in foot and ankle pain for 2 days following treatment. Feeling better today    Currently in Pain? Yes   Pain Score 3    Pain Location Ankle   Pain Orientation Left   Pain Descriptors / Indicators Dull   Pain Radiating Towards ankle and arch   Pain Onset More than a month ago   Pain Frequency Constant            [facility] PT Assessment - 07/23/15 0001    Assessment   Medical Diagnosis L foot pain    Onset Date/Surgical Date 01/14/15    Next MD Visit as needed   Flexibility   Hamstrings Rt 75 deg; Lt 83 deg    Piriformis tight Rt diagonals                      [facility] Adult PT Treatment/Exercise - 07/23/15 0001    Cryotherapy   Number Minutes Cryotherapy 10 Minutes   Cryotherapy Location Ankle  foot   Type of Cryotherapy Ice pack   Manual Therapy   Manual therapy comments TC AP glides Grade IV 3x45" and manual PROM into DF   Ankle Exercises: Stretches   Plantar Fascia Stretch 3 reps;20 seconds   Soleus Stretch 3 reps;20 seconds   Ankle Exercises: Aerobic   Stationary Bike Nustep L4 x5 min    Ankle Exercises: Standing   Vector Stance Limitations SLS Lt touch fwd/side/back x10    SLS Lt 30 sec x1; 30 sec X2 with blue TB foam    Other Standing Ankle Exercises shallow knee bends 10 sec hold x 10    Ankle Exercises: Supine   Other Supine Ankle Exercises hamsting stretch 30 x3  Other Supine Ankle Exercises piriformis stretch 30 sec x 3                 PT Education - 07/23/15 1351    Education Details HEP   Person(s) Educated Patient   Methods Explanation;Demonstration;Tactile cues;Verbal cues;Handout   Comprehension Verbalized understanding;Returned demonstration;Verbal cues required;Tactile cues required          PT Short Term Goals - 07/20/15 1200    PT SHORT TERM GOAL #1   Title pt will be independent with initial HEP   Time 1   Period Weeks   Status New           PT Long Term Goals - 07/20/15 1200    PT LONG TERM GOAL #1   Title pt will be independent with final HEP   Time 4   Period Weeks   Status New   PT LONG TERM GOAL #2   Title pt will improve L ankle dorsiflexion to 10degrees or more   Time 6   Period Weeks   Status New   PT LONG TERM GOAL #3   Title pt will perform single leg stance on foam without increased sway compared to the well leg   Time 6   Period Weeks   Status New   PT LONG TERM GOAL #4   Title pt will resume walking 100000 steps per day  without increased L foot pain   Time 6   Period Weeks   Status New               Plan - 07/23/15 1358    Clinical Impression Statement Significant incresae in pain following initial visit. Add ice post treatment and reminded pat to use ice at home for increaesd symptoms. Pt reported Lt hip pin and tightness. Noted tightness through hamstrings/piriformis and added stretches to address    Pt will benefit from skilled therapeutic intervention in order to improve on the following deficits Abnormal gait;Decreased balance;Pain;Hypomobility;Decreased range of motion;Difficulty walking   Rehab Potential Excellent   PT Frequency 2x / week   PT Duration 6 weeks   PT Treatment/Interventions Aquatic Therapy;Electrical Stimulation;Moist Heat;Ultrasound;Therapeutic exercise;Patient/family education;Manual techniques;Passive range of motion   PT Next Visit Plan review HEP; begin L ankle ROM, propriception, gait, manual work to increase ROM, plantar fasciitis stretches   PT Home Exercise Plan HEP; ice prn    Consulted and Agree with Plan of Care Patient        Problem List Patient Active Problem List   Diagnosis Date Noted  . Depression with anxiety 01/18/2015  . Sinusitis 12/05/2013  . Hyperlipidemia, mixed 05/20/2013  . Bilateral hand pain 12/13/2011  . Preventative health care 12/13/2011  . SACROILIITIS, LEFT 11/23/2010  . Overweight 10/11/2010  . UNSPECIFIED ANEMIA 10/11/2010  . Allergic rhinitis 10/11/2010  . PLANTAR FASCIITIS, LEFT 10/11/2010  . ELEVATED BP READING WITHOUT DX HYPERTENSION 10/11/2010  . HEART MURMUR, HX OF 10/11/2010  . [place] OF 10/11/2010    Jayma Volpi Nilda Simmer PT, MPH 07/23/2015, 2:02 PM  [facility] [address]  [place], [place], [address] Phone: [phone]   Fax:  [phone]  Name: Breanna Bridges MRN: [id number] Date of Birth: [birth year]

## 2015-07-27 ENCOUNTER — Ambulatory Visit: Payer: Managed Care, Other (non HMO) | Admitting: Physical Therapy

## 2015-07-29 ENCOUNTER — Ambulatory Visit: Payer: Managed Care, Other (non HMO) | Admitting: Rehabilitative and Restorative Service Providers"

## 2015-07-29 DIAGNOSIS — R262 Difficulty in walking, not elsewhere classified: Secondary | ICD-10-CM

## 2015-07-29 DIAGNOSIS — M25572 Pain in left ankle and joints of left foot: Secondary | ICD-10-CM | POA: Diagnosis not present

## 2015-07-29 NOTE — Therapy (Signed)
[facility] [address]  [place], [place], [address] Phone: [phone]   Fax:  [phone]  Physical Therapy Treatment  Patient Details  Name: GAI NEVEU MRN: [id number] Date of Birth: 03/23/1956 Referring Provider: Dr. Kathi Simpers  Encounter Date: 07/29/2015      PT End of Session - 07/29/15 1414    Visit Number 3   Number of Visits 12   Date for PT Re-Evaluation 08/31/15   PT Start Time 1410   PT Stop Time 1459   PT Time Calculation (min) 49 min   Activity Tolerance Patient tolerated treatment well      Past Medical History  Diagnosis Date  . Depression     h/o depression/anxiety  . HSV infection   . Abnormal Pap smear     CIN I-LEEP  . GERD (gastroesophageal reflux disease)   . Stress fracture 2008    left foot  . Trigger finger of left hand     ring finger  . Other and unspecified hyperlipidemia 05/20/2013  . Depression with anxiety 01/18/2015    Past Surgical History  Procedure Laterality Date  . Hammer toe repair  10+ yrs    left foot  . Leep  15+ yrs    normal paps since then  . Refractive surgery  5 yrs ago    b/l  . Foot surgery  04/15/14    left foot-bone spur, Dr. Sharol Given    There were no vitals filed for this visit.  Visit Diagnosis:  Pain in joint, ankle and foot, left  Difficulty walking      Subjective Assessment - 07/29/15 1414    Subjective pt reports that she was very sore through arch of her foot after last visit - mostly the following day - which resolved after that. She has been doing her exercises at home and feels she is making some progress. She has improved ability to stand on one foot  and walking seems to be better.    Currently in Pain? No/denies                         [facility] Adult PT Treatment/Exercise - 07/29/15 0001    Vasopneumatic   Number Minutes Vasopneumatic  15 minutes   Vasopnuematic Location  Ankle   Vasopneumatic Pressure  Medium   Vasopneumatic Temperature  3*   Manual Therapy   Manual therapy comments TC AP glides Grade IV 3x45" and manual PROM into DF   Ankle Exercises: Aerobic   Stationary Bike Nustep L4 x5 min    Ankle Exercises: Stretches   Plantar Fascia Stretch 3 reps;20 seconds   Soleus Stretch 3 reps;20 seconds   Ankle Exercises: Standing   Vector Stance Limitations SLS Lt touch fwd/side/back x10    SLS Lt 30 sec X2 with blue TB foam    Heel Walk (Round Trip) heel toe walk 25 x 2    Braiding (Round Trip) 25 ft x 2   more difficult to Rt    Other Standing Ankle Exercises shallow knee bends 10 sec hold x 10    Other Standing Ankle Exercises side steps bilat 25 ft x 4   "twinge" moving to the Lt    Ankle Exercises: Supine   T-Band 4 way ankle blue TB x 10                 PT Education - 07/29/15 1502  Education provided Yes   Education Details gait without turning toes out; HEP    Person(s) Educated Patient   Methods Explanation   Comprehension Verbalized understanding          PT Short Term Goals - 07/29/15 1506    PT SHORT TERM GOAL #1   Title pt will be independent with initial HEP   Time 1   Period Weeks   Status Achieved           PT Long Term Goals - 07/29/15 1505    PT LONG TERM GOAL #1   Title pt will be independent with final HEP   Time 4   Period Weeks   Status On-going   PT LONG TERM GOAL #2   Title pt will improve L ankle dorsiflexion to 10degrees or more   Period Weeks   Status On-going   PT LONG TERM GOAL #3   Title pt will perform single leg stance on foam without increased sway compared to the well leg   Time 6   Period Weeks   Status On-going   PT LONG TERM GOAL #4   Title pt will resume walking 100000 steps per day without increased L foot pain   Time 6   Period Weeks   Status On-going               Plan - 07/29/15 1503    Clinical Impression Statement Increased pain following last visit but in a different place - this time  pain was into the arch of Lt foot - resolved in about a day. Gait imporvedl SLS improved; mobility with manula work improved. Progressing well toward stated goals of therapy.    Pt will benefit from skilled therapeutic intervention in order to improve on the following deficits Abnormal gait;Decreased balance;Pain;Hypomobility;Decreased range of motion;Difficulty walking   Rehab Potential Excellent   PT Frequency 2x / week   PT Duration 6 weeks   PT Treatment/Interventions Aquatic Therapy;Electrical Stimulation;Moist Heat;Ultrasound;Therapeutic exercise;Patient/family education;Manual techniques;Passive range of motion   PT Next Visit Plan review HEP;  L ankle ROM, propriception, gait, manual work to increase ROM, plantar fasciitis stretches   PT Home Exercise Plan HEP; ice prn    Consulted and Agree with Plan of Care Patient        Problem List Patient Active Problem List   Diagnosis Date Noted  . Depression with anxiety 01/18/2015  . Sinusitis 12/05/2013  . Hyperlipidemia, mixed 05/20/2013  . Bilateral hand pain 12/13/2011  . Preventative health care 12/13/2011  . SACROILIITIS, LEFT 11/23/2010  . Overweight 10/11/2010  . UNSPECIFIED ANEMIA 10/11/2010  . Allergic rhinitis 10/11/2010  . PLANTAR FASCIITIS, LEFT 10/11/2010  . ELEVATED BP READING WITHOUT DX HYPERTENSION 10/11/2010  . HEART MURMUR, HX OF 10/11/2010  . [place] OF 10/11/2010    Cavon Nicolls Nilda Simmer PT, MPH 07/29/2015, 3:08 PM  [facility] [address]  [place], [place], [address] Phone: [phone]   Fax:  [phone]  Name: ZARETH REZNICEK MRN: [id number] Date of Birth: [birth year]

## 2015-07-30 ENCOUNTER — Ambulatory Visit: Payer: Managed Care, Other (non HMO) | Admitting: Physical Therapy

## 2015-07-30 ENCOUNTER — Encounter: Payer: Self-pay | Admitting: Obstetrics & Gynecology

## 2015-07-30 ENCOUNTER — Ambulatory Visit (INDEPENDENT_AMBULATORY_CARE_PROVIDER_SITE_OTHER): Payer: Managed Care, Other (non HMO) | Admitting: Obstetrics & Gynecology

## 2015-07-30 VITALS — BP 124/70 | HR 60 | Ht 61.5 in | Wt 187.0 lb

## 2015-07-30 DIAGNOSIS — M25572 Pain in left ankle and joints of left foot: Secondary | ICD-10-CM

## 2015-07-30 DIAGNOSIS — Z124 Encounter for screening for malignant neoplasm of cervix: Secondary | ICD-10-CM

## 2015-07-30 DIAGNOSIS — R262 Difficulty in walking, not elsewhere classified: Secondary | ICD-10-CM

## 2015-07-30 DIAGNOSIS — Z0000 Encounter for general adult medical examination without abnormal findings: Secondary | ICD-10-CM | POA: Diagnosis not present

## 2015-07-30 DIAGNOSIS — Z01419 Encounter for gynecological examination (general) (routine) without abnormal findings: Secondary | ICD-10-CM

## 2015-07-30 MED ORDER — FAMCICLOVIR 250 MG PO TABS: 250.0000 mg | ORAL_TABLET | Freq: Two times a day (BID) | ORAL | Status: DC

## 2015-07-30 MED ORDER — ESTRADIOL 0.1 MG/GM VA CREA: TOPICAL_CREAM | VAGINAL | Status: DC

## 2015-07-30 NOTE — Progress Notes (Signed)
Patient ID: Breanna Bridges, female   DOB: Mar 04, 1956, 59 y.o.   MRN: [id number]  59 y.o. G0P0000 MarriedCaucasianF here for annual exam.  Pt is doing well.  Pt reports having repeat surgery this year but went to [facility] for this.  She is doing well.  In PT and this is really helping.    Denies vaginal bleeding.     Labs done with work.  She is going to repeat these with Dr. Randel Pigg in the spring.  Patient's last menstrual period was 03/02/2011 (approximate).          Sexually active: Yes.    The current method of family planning is post menopausal status.    Exercising: No.  Patient is in physical therapy for foot, she will be able to restart exercise soon. Smoker:  no  Health Maintenance: Pap:  06/19/14, Negative  History of abnormal Pap:  Yes, LEEP CIN 1, 1995 MMG:  07/24/14, Bi-Rads 1:  Negative Colonoscopy:  12/11/09, Normal, Repeat in 10 years.  Dr. Olevia Perches. BMD:   Never TDaP:  2013 Screening Labs: PCP and Work, Hb today: declined, Urine today: declined   reports that she quit smoking about 31 years ago. She has never used smokeless tobacco. She reports that she drinks about 5.0 oz of alcohol per week. She reports that she does not use illicit drugs.  Past Medical History  Diagnosis Date  . Depression     h/o depression/anxiety  . HSV infection   . Abnormal Pap smear     CIN I-LEEP  . GERD (gastroesophageal reflux disease)   . Stress fracture 2008    left foot  . Trigger finger of left hand     ring finger  . Other and unspecified hyperlipidemia 05/20/2013  . Depression with anxiety 01/18/2015    Past Surgical History  Procedure Laterality Date  . Hammer toe surgery  10+ yrs    left foot  . Leep  15+ yrs    normal paps since then  . Refractive surgery  5 yrs ago    b/l  . Foot surgery Left 04/15/14    left foot-bone spur, Dr. Sharol Given; second surgery 01/2015 to correct prior surgery    Current Outpatient Prescriptions  Medication Sig Dispense Refill  . ALPRAZolam (XANAX) 0.25  MG tablet Take 1 tablet (0.25 mg total) by mouth 2 (two) times daily as needed for sleep or anxiety. 10 tablet 1  . aspirin 81 MG tablet Take 81 mg by mouth daily.      . calcium carbonate 200 MG capsule Take 400 mg by mouth daily.      . cetirizine (ZYRTEC) 10 MG tablet Take 10 mg by mouth daily.      Marland Kitchen estradiol (ESTRACE) 0.1 MG/GM vaginal cream 1 gram vaginally twice weekly 42.5 g 6  . famciclovir (FAMVIR) 250 MG tablet TAKE 2 TABLETS TWICE A DAY FOR 7 DAYS WITH OUTBREAKS THEN 1 TABLET TWICE DAILY 90 tablet 3  . fluticasone (FLONASE) 50 MCG/ACT nasal spray Place 1 spray into both nostrils daily. In spring, summer, fall 48 g 3  . meloxicam (MOBIC) 15 MG tablet Take 15 mg by mouth daily as needed. For pain- take with food     . multivitamin (THERAGRAN) per tablet Take 1 tablet by mouth daily.      . Omega-3 Fatty Acids (FISH OIL PO) Take by mouth.    . pantoprazole (PROTONIX) 40 MG tablet Take 1 tablet (40 mg total) by mouth daily. [place]  tablet 3  . Probiotic Product (PROBIOTIC DAILY PO) Take by mouth daily.    Marland Kitchen venlafaxine XR (EFFEXOR XR) 75 MG 24 hr capsule Take 1 capsule (75 mg total) by mouth daily with breakfast. 90 capsule 2   No current facility-administered medications for this visit.    Family History  Problem Relation Age of Onset  . Other Mother     CHF  . Diabetes Mother     Borderline  . Hypertension Mother   . Obesity Mother   . Anxiety disorder Mother   . Other Father     CHF  . Obesity Sister   . Diabetes Sister     type 2  . Hypertension Sister   . Anxiety disorder Sister   . Diverticulosis Sister   . Hyperlipidemia Sister   . Obesity Brother   . Anxiety disorder Brother   . Cancer Maternal Aunt     breast  . Cancer Paternal Aunt     Breast  . Obesity Paternal Grandmother   . Other Paternal Grandfather     Committed suicide during the [place] Depression  . Obesity Sister   . Anxiety disorder Sister   . Fibrocystic breast disease Sister   . Obesity Sister      ROS:  Pertinent items are noted in HPI.  Otherwise, a comprehensive ROS was negative.  Exam:   BP 124/70 mmHg  Pulse 60  Ht 5' 1.5" (1.562 m)  Wt 187 lb (84.823 kg)  BMI 34.77 kg/m2  LMP 03/02/2011 (Approximate)    Height: 5' 1.5" (156.2 cm)  Ht Readings from Last 3 Encounters:  07/30/15 5' 1.5" (1.562 m)  01/13/15 5\' 2"  (1.575 m)  06/19/14 5' 1.5" (1.562 m)    General appearance: alert, cooperative and appears stated age Head: Normocephalic, without obvious abnormality, atraumatic Neck: no adenopathy, supple, symmetrical, trachea midline and thyroid normal to inspection and palpation Lungs: clear to auscultation bilaterally Breasts: normal appearance, no masses or tenderness Heart: regular rate and rhythm Abdomen: soft, non-tender; bowel sounds normal; no masses,  no organomegaly Extremities: extremities normal, atraumatic, no cyanosis or edema Skin: Skin color, texture, turgor normal. No rashes or lesions Lymph nodes: Cervical, supraclavicular, and axillary nodes normal. No abnormal inguinal nodes palpated Neurologic: Grossly normal   Pelvic: External genitalia:  no lesions              Urethra:  normal appearing urethra with no masses, tenderness or lesions              Bartholins and Skenes: normal                 Vagina: normal appearing vagina with normal color and discharge, no lesions              Cervix: no lesions              Pap taken: Yes.   Bimanual Exam:  Uterus:  normal size, contour, position, consistency, mobility, non-tender              Adnexa: normal adnexa and no mass, fullness, tenderness               Rectovaginal: Confirms               Anus:  normal sphincter tone, no lesions  Chaperone was present for exam.  A:  Well Woman with normal exam  PMP, no HRT  H/O depression/anxiety  H/O HSV  Vaginal atrophic changes Repeat foot surgery  this year.  Currently in PT.  P: Mammogram yearly  Famavir 250mg  BID. #180/4 RF to pharmacy.  pap  smear with neg HR HPV 7/13. Pap with HR HPV today Estrace vaginal cream 1 gm pv twice weekly #32.5g/2RF Labs planned with Dr. Randel Pigg in the spring. return annually or prn

## 2015-07-30 NOTE — Therapy (Signed)
[facility] [address]  [place], [place], [address] Phone: [phone]   Fax:  [phone]  Physical Therapy Treatment  Patient Details  Name: Breanna Bridges MRN: [id number] Date of Birth: [birth year] Referring Provider: Dr. Kathi Simpers  Encounter Date: 07/30/2015      PT End of Session - 07/30/15 1428    Visit Number 4   Number of Visits 12   Date for PT Re-Evaluation 08/31/15   PT Start Time [id number]   PT Stop Time 1441   PT Time Calculation (min) 56 min   Activity Tolerance Patient tolerated treatment well   Behavior During Therapy [facility] for tasks assessed/performed      Past Medical History  Diagnosis Date  . Depression     h/o depression/anxiety  . HSV infection   . Abnormal Pap smear     CIN I-LEEP  . GERD (gastroesophageal reflux disease)   . Stress fracture 2008    left foot  . Trigger finger of left hand     ring finger  . Other and unspecified hyperlipidemia 05/20/2013  . Depression with anxiety 01/18/2015    Past Surgical History  Procedure Laterality Date  . Hammer toe surgery  10+ yrs    left foot  . Leep  15+ yrs    normal paps since then  . Refractive surgery  5 yrs ago    b/l  . Foot surgery Left 04/15/14    left foot-bone spur, Dr. Sharol Given; second surgery 01/2015 to correct prior surgery  . Foot surgery Left 5/16    Dr Beckey Downing at [facility]    There were no vitals filed for this visit.  Visit Diagnosis:  Pain in joint, ankle and foot, left  Difficulty walking      Subjective Assessment - 07/30/15 1345    Subjective overall doing well. L ankle a little bit of aching today.   Patient Stated Goals walk without pain   Currently in Pain? Yes   Pain Score 2    Pain Location Ankle   Pain Orientation Left   Pain Descriptors / Indicators Aching   Pain Radiating Towards ankle and arch   Pain Onset More than a month ago   Pain Frequency Constant   Aggravating Factors  walking   Pain  Relieving Factors rest                         [facility] Adult PT Treatment/Exercise - 07/30/15 1347    Vasopneumatic   Number Minutes Vasopneumatic  15 minutes   Vasopnuematic Location  Ankle   Vasopneumatic Pressure Medium   Vasopneumatic Temperature  3*   Manual Therapy   Manual therapy comments TC AP glides Grade IV 3x45" and manual PROM into DF   Ankle Exercises: Aerobic   Stationary Bike Nustep L5 x5 min    Ankle Exercises: Stretches   Soleus Stretch 2 reps;30 seconds;Limitations   Soleus Stretch Limitations with Licensed conveyancer 2 reps;30 seconds;Limitations   Gastroc Stretch Limitations with prostretch   Ankle Exercises: Standing   Vector Stance Limitations SLS Lt reach fwd/side/back x10    SLS Lt 30 sec X2 with blue TB foam    Other Standing Ankle Exercises mini squats with RLE forward/backwards/laterally x 5 each direction; static stand on BOSU 10 x 3-5 sec hold   Ankle Exercises: Seated   [place] standing on compliant surface  PT Education - 07/29/15 1502    Education provided Yes   Education Details gait without turning toes out; HEP    Person(s) Educated Patient   Methods Explanation   Comprehension Verbalized understanding          PT Short Term Goals - 07/29/15 1506    PT SHORT TERM GOAL #1   Title pt will be independent with initial HEP   Time 1   Period Weeks   Status Achieved           PT Long Term Goals - 07/29/15 1505    PT LONG TERM GOAL #1   Title pt will be independent with final HEP   Time 4   Period Weeks   Status On-going   PT LONG TERM GOAL #2   Title pt will improve L ankle dorsiflexion to 10degrees or more   Period Weeks   Status On-going   PT LONG TERM GOAL #3   Title pt will perform single leg stance on foam without increased sway compared to the well leg   Time 6   Period Weeks   Status On-going   PT LONG TERM GOAL #4   Title pt will resume walking 100000 steps per  day without increased L foot pain   Time 6   Period Weeks   Status On-going               Plan - 07/30/15 1429    Clinical Impression Statement Continues to have difficulty with SLS activities but no increase in pain.  Progressing well towards goals.   PT Next Visit Plan L ankle ROM, propriception, gait, manual work to increase ROM, plantar fasciitis stretches   PT Home Exercise Plan HEP; ice prn    Consulted and Agree with Plan of Care Patient        Problem List Patient Active Problem List   Diagnosis Date Noted  . Depression with anxiety 01/18/2015  . Sinusitis 12/05/2013  . Hyperlipidemia, mixed 05/20/2013  . Bilateral hand pain 12/13/2011  . Preventative health care 12/13/2011  . SACROILIITIS, LEFT 11/23/2010  . Overweight 10/11/2010  . UNSPECIFIED ANEMIA 10/11/2010  . Allergic rhinitis 10/11/2010  . PLANTAR FASCIITIS, LEFT 10/11/2010  . ELEVATED BP READING WITHOUT DX HYPERTENSION 10/11/2010  . HEART MURMUR, HX OF 10/11/2010  . [place] OF 10/11/2010   Laureen Abrahams, PT, DPT 07/30/2015 2:41 PM  [facility] [address]  [place], [place], [address] Phone: [phone]   Fax:  [phone]  Name: Breanna Bridges MRN: [id number] Date of Birth: Jun 14, 1956

## 2015-07-30 NOTE — Patient Instructions (Signed)
For fever blister treatment, take 1500mg  famvir all at once.  You do not repeat this.  1500mg  = 6 (250mg ) tabs

## 2015-08-03 ENCOUNTER — Ambulatory Visit: Payer: Managed Care, Other (non HMO) | Admitting: Rehabilitation

## 2015-08-03 DIAGNOSIS — M25572 Pain in left ankle and joints of left foot: Secondary | ICD-10-CM

## 2015-08-03 DIAGNOSIS — R262 Difficulty in walking, not elsewhere classified: Secondary | ICD-10-CM

## 2015-08-03 LAB — IPS PAP TEST WITH HPV

## 2015-08-03 NOTE — Therapy (Signed)
[facility] [address]  [place], [place], [address] Phone: [phone]   Fax:  [phone]  Physical Therapy Treatment  Patient Details  Name: Breanna Bridges MRN: [id number] Date of Birth: 06-Apr-1956 Referring Provider: Dr. Kathi Simpers  Encounter Date: 08/03/2015      PT End of Session - 08/03/15 1653    Visit Number 5   Number of Visits 12   Date for PT Re-Evaluation 08/31/15   PT Start Time 7412   PT Stop Time 1708   PT Time Calculation (min) 51 min   Activity Tolerance Patient tolerated treatment well      Past Medical History  Diagnosis Date  . Depression     h/o depression/anxiety  . HSV infection   . Abnormal Pap smear     CIN I-LEEP  . GERD (gastroesophageal reflux disease)   . Stress fracture 2008    left foot  . Trigger finger of left hand     ring finger  . Other and unspecified hyperlipidemia 05/20/2013  . Depression with anxiety 01/18/2015    Past Surgical History  Procedure Laterality Date  . Hammer toe surgery  10+ yrs    left foot  . Leep  15+ yrs    normal paps since then  . Refractive surgery  5 yrs ago    b/l  . Foot surgery Left 04/15/14    left foot-bone spur, Dr. Sharol Given; second surgery 01/2015 to correct prior surgery  . Foot surgery Left 5/16    Dr Beckey Downing at [facility]    There were no vitals filed for this visit.  Visit Diagnosis:  Pain in joint, ankle and foot, left  Difficulty walking      Subjective Assessment - 08/03/15 1617    Subjective feeling better overall .  good and bad days. I don't have alot of the shooting pain I was having and the arch pain is better.  Still having trouble with arch tightness.  would like to continue at least a few more weeks   Currently in Pain? Yes   Pain Score 3    Pain Location Ankle   Pain Orientation Left   Pain Descriptors / Indicators Aching            [facility] PT Assessment - 08/03/15 0001    AROM   Left Ankle  Dorsiflexion 10   PROM   Left Ankle Dorsiflexion 11                     [facility] Adult PT Treatment/Exercise - 08/03/15 0001    Vasopneumatic   Number Minutes Vasopneumatic  15 minutes   Vasopnuematic Location  Ankle   Vasopneumatic Pressure Medium   Vasopneumatic Temperature  3*   Manual Therapy   Manual therapy comments TC AP glides Grade IV 3x45" and manual PROM into DF; STM to medial arch with great toe extension stretches   Ankle Exercises: Aerobic   Stationary Bike Nustep L5 x5 min    Ankle Exercises: Stretches   Soleus Stretch 2 reps;30 seconds;Limitations   Soleus Stretch Limitations with Licensed conveyancer 2 reps;30 seconds;Limitations   Gastroc Stretch Limitations with prostretch   Ankle Exercises: Seated   [place] Pickup standing on compliant surface   Ankle Exercises: Standing   Vector Stance Limitations SLS Lt reach fwd/side/back x10 on blue TB disc    Ankle Exercises: Supine   T-Band 4 way ankle blue TB x  20                  PT Short Term Goals - 07/29/15 1506    PT SHORT TERM GOAL #1   Title pt will be independent with initial HEP   Time 1   Period Weeks   Status Achieved           PT Long Term Goals - 08/03/15 1625    PT LONG TERM GOAL #1   Title pt will be independent with final HEP   Status On-going   PT LONG TERM GOAL #2   Title pt will improve L ankle dorsiflexion to 10degrees or more   Status Achieved   PT LONG TERM GOAL #3   Title pt will perform single leg stance on foam without increased sway compared to the well leg   Status On-going   PT LONG TERM GOAL #4   Title pt will resume walking 10000 steps per day without increased L foot pain   Status On-going               Plan - 08/03/15 1654    Clinical Impression Statement progressing well toward goals.  has met her DF ROM goal of 10deg or more as of today.  Will benefit from continued services to improve ankle strength and mobility   PT Next Visit  Plan L ankle ROM, propriception, gait, manual work to increase ROM, plantar fasciitis stretches        Problem List Patient Active Problem List   Diagnosis Date Noted  . Depression with anxiety 01/18/2015  . Sinusitis 12/05/2013  . Hyperlipidemia, mixed 05/20/2013  . Bilateral hand pain 12/13/2011  . Preventative health care 12/13/2011  . SACROILIITIS, LEFT 11/23/2010  . Overweight 10/11/2010  . UNSPECIFIED ANEMIA 10/11/2010  . Allergic rhinitis 10/11/2010  . PLANTAR FASCIITIS, LEFT 10/11/2010  . ELEVATED BP READING WITHOUT DX HYPERTENSION 10/11/2010  . HEART MURMUR, HX OF 10/11/2010  . [place] OF 10/11/2010    Stark Bray, DPT, CMP 08/03/2015, 4:56 PM  [facility] [address]  [place], [place], [address] Phone: [phone]   Fax:  [phone]  Name: CYLIE DOR MRN: [id number] Date of Birth: 06-03-1956

## 2015-08-05 ENCOUNTER — Ambulatory Visit: Payer: Managed Care, Other (non HMO) | Admitting: Physical Therapy

## 2015-08-05 DIAGNOSIS — M25572 Pain in left ankle and joints of left foot: Secondary | ICD-10-CM | POA: Diagnosis not present

## 2015-08-05 DIAGNOSIS — R262 Difficulty in walking, not elsewhere classified: Secondary | ICD-10-CM

## 2015-08-05 NOTE — Therapy (Signed)
[facility] [address]  [place], [place], [address] Phone: [phone]   Fax:  [phone]  Physical Therapy Treatment  Patient Details  Name: Breanna Bridges MRN: [id number] Date of Birth: 15-Dec-1955 Referring Provider: Dr. Kathi Simpers  Encounter Date: 08/05/2015      PT End of Session - 08/05/15 1154    Visit Number 6   Number of Visits 12   Date for PT Re-Evaluation 08/31/15   PT Start Time 1149   PT Stop Time 1246   PT Time Calculation (min) 57 min   Activity Tolerance Patient tolerated treatment well   Behavior During Therapy [facility] for tasks assessed/performed      Past Medical History  Diagnosis Date  . Depression     h/o depression/anxiety  . HSV infection   . Abnormal Pap smear     CIN I-LEEP  . GERD (gastroesophageal reflux disease)   . Stress fracture 2008    left foot  . Trigger finger of left hand     ring finger  . Other and unspecified hyperlipidemia 05/20/2013  . Depression with anxiety 01/18/2015    Past Surgical History  Procedure Laterality Date  . Hammer toe surgery  10+ yrs    left foot  . Leep  15+ yrs    normal paps since then  . Refractive surgery  5 yrs ago    b/l  . Foot surgery Left 04/15/14    left foot-bone spur, Dr. Sharol Given; second surgery 01/2015 to correct prior surgery  . Foot surgery Left 5/16    Dr Beckey Downing at [facility]    There were no vitals filed for this visit.  Visit Diagnosis:  Pain in joint, ankle and foot, left  Difficulty walking      Subjective Assessment - 08/05/15 1151    Subjective States pain slightly worse today and more swelling noted in top of foot. Having difficulty with ankle ABC's due to pain.   Patient Stated Goals walk without pain   Currently in Pain? Yes   Pain Score 4    Pain Location Foot   Pain Orientation Left                         [facility] Adult PT Treatment/Exercise - 08/05/15 1149    Modalities   Modalities  Vasopneumatic   Vasopneumatic   Number Minutes Vasopneumatic  15 minutes   Vasopnuematic Location  Ankle   Vasopneumatic Pressure Medium   Vasopneumatic Temperature  3*   Manual Therapy   Manual therapy comments TC AP glides Grade IV 3x45" and manual PROM into DF; STM to medial arch with great toe extension & posterior tibialis stretches   Ankle Exercises: Aerobic   Stationary Bike Nustep L5 x5 min    Ankle Exercises: Stretches   Soleus Stretch 2 reps;30 seconds;Limitations   Soleus Stretch Limitations with Licensed conveyancer 2 reps;30 seconds;Limitations   Press photographer Limitations with prostretch   Ankle Exercises: Seated   BAPS Sitting;Level 3;10 reps;Weight   BAPS Limitations PF/DF, IV/EV, CW/CCW w/o weights; PF/DF, IV/EV with 5# at PM pole; PF/DF with 5#, IV/EV with 2.5# at PL pole   Ankle Exercises: Standing   Vector Stance Limitations Lt SLS with Rt foot reach fwd/side/back x10 on blue foam oval    Other Standing Ankle Exercises static stand on BOSU (up) 10 x 5 sec hold  PT Short Term Goals - 07/29/15 1506    PT SHORT TERM GOAL #1   Title pt will be independent with initial HEP   Time 1   Period Weeks   Status Achieved           PT Long Term Goals - 08/03/15 1625    PT LONG TERM GOAL #1   Title pt will be independent with final HEP   Status On-going   PT LONG TERM GOAL #2   Title pt will improve L ankle dorsiflexion to 10degrees or more   Status Achieved   PT LONG TERM GOAL #3   Title pt will perform single leg stance on foam without increased sway compared to the well leg   Status On-going   PT LONG TERM GOAL #4   Title pt will resume walking 10000 steps per day without increased L foot pain   Status On-going               Plan - 08/05/15 1232    Clinical Impression Statement Reporting increased pain in arch of left foot which responded well to manual therapy for STM and stretches. Tolerance for SLS activities  on compliant surfaces (blue foam oval or BOSU up) improving but c/o increased pain in medial arch when SLS propriocpetive activities attempted with BOSU down. BAPS board initiated in sitting with patient demonstrating more difficulty manintaining good control when weights positioned at PL pole.   PT Next Visit Plan L ankle ROM, propriception, gait, manual work to increase ROM, plantar fasciitis stretches   Consulted and Agree with Plan of Care Patient        Problem List Patient Active Problem List   Diagnosis Date Noted  . Depression with anxiety 01/18/2015  . Sinusitis 12/05/2013  . Hyperlipidemia, mixed 05/20/2013  . Bilateral hand pain 12/13/2011  . Preventative health care 12/13/2011  . SACROILIITIS, LEFT 11/23/2010  . Overweight 10/11/2010  . UNSPECIFIED ANEMIA 10/11/2010  . Allergic rhinitis 10/11/2010  . PLANTAR FASCIITIS, LEFT 10/11/2010  . ELEVATED BP READING WITHOUT DX HYPERTENSION 10/11/2010  . HEART MURMUR, HX OF 10/11/2010  . [place] OF 10/11/2010    Percival Spanish, PT, MPT 08/05/2015, 12:49 PM  [facility] [address]  [place], [place], [address] Phone: [phone]   Fax:  [phone]  Name: Breanna Bridges MRN: [id number] Date of Birth: October 22, 1955

## 2015-08-10 ENCOUNTER — Ambulatory Visit: Payer: Managed Care, Other (non HMO) | Admitting: Rehabilitation

## 2015-08-10 DIAGNOSIS — M25572 Pain in left ankle and joints of left foot: Secondary | ICD-10-CM

## 2015-08-10 DIAGNOSIS — R262 Difficulty in walking, not elsewhere classified: Secondary | ICD-10-CM

## 2015-08-10 NOTE — Therapy (Signed)
[facility] [address]  [place], [place], [address] Phone: [phone]   Fax:  [phone]  Physical Therapy Treatment  Patient Details  Name: Breanna Bridges MRN: [id number] Date of Birth: 14-Nov-1955 Referring Provider: Dr. Kathi Simpers  Encounter Date: 08/10/2015      PT End of Session - 08/10/15 1557    Visit Number 7   Number of Visits 12   Date for PT Re-Evaluation 08/31/15   PT Start Time [id number]   PT Stop Time 1612   PT Time Calculation (min) 42 min   Activity Tolerance Patient tolerated treatment well      Past Medical History  Diagnosis Date  . Depression     h/o depression/anxiety  . HSV infection   . Abnormal Pap smear     CIN I-LEEP  . GERD (gastroesophageal reflux disease)   . Stress fracture 2008    left foot  . Trigger finger of left hand     ring finger  . Other and unspecified hyperlipidemia 05/20/2013  . Depression with anxiety 01/18/2015    Past Surgical History  Procedure Laterality Date  . Hammer toe surgery  10+ yrs    left foot  . Leep  15+ yrs    normal paps since then  . Refractive surgery  5 yrs ago    b/l  . Foot surgery Left 04/15/14    left foot-bone spur, Dr. Sharol Given; second surgery 01/2015 to correct prior surgery  . Foot surgery Left 5/16    Dr Beckey Downing at [facility]    There were no vitals filed for this visit.  Visit Diagnosis:  Pain in joint, ankle and foot, left  Difficulty walking      Subjective Assessment - 08/10/15 1537    Subjective could barely walk after being on feet all day for Thanksgiving.  Better now.  Pain arch and medial.    Currently in Pain? Yes   Pain Score 2    Pain Location Foot   Pain Orientation Left   Pain Descriptors / Indicators Aching   Aggravating Factors  walking   Pain Relieving Factors rest                         [facility] Adult PT Treatment/Exercise - 08/10/15 0001    Manual Therapy   Manual therapy comments  PROM into DF; STM and release to the L plantar fascia and calf   Ankle Exercises: Aerobic   Stationary Bike Nustep L5 x5 min    Ankle Exercises: Seated   BAPS Sitting;Level 3;10 reps;Weight   BAPS Limitations PF/DF, IV/EV, CW/CCW w/o weights; PF/DF, IV/EV with 5# at PM pole; PF/DF with 5#, IV/EV with 2.5# at PL pole   Ankle Exercises: Standing   Vector Stance Limitations rhythmic stab green tb SL both directions 2x10   Other Standing Ankle Exercises static stand on BOSU (up) 10 x 5 sec hold; then HHA prolonged holds as tolerated x 5   Ankle Exercises: Stretches   Soleus Stretch Limitations with prostretch   Gastroc Stretch 2 reps;30 seconds;Limitations                  PT Short Term Goals - 07/29/15 1506    PT SHORT TERM GOAL #1   Title pt will be independent with initial HEP   Time 1   Period Weeks   Status Achieved  PT Long Term Goals - 08/03/15 1625    PT LONG TERM GOAL #1   Title pt will be independent with final HEP   Status On-going   PT LONG TERM GOAL #2   Title pt will improve L ankle dorsiflexion to 10degrees or more   Status Achieved   PT LONG TERM GOAL #3   Title pt will perform single leg stance on foam without increased sway compared to the well leg   Status On-going   PT LONG TERM GOAL #4   Title pt will resume walking 10000 steps per day without increased L foot pain   Status On-going               Plan - 08/10/15 1557    Clinical Impression Statement continues with intermittent bad days of pain, but tolerating all well in clinic.  still struggles with high level balance work.  good response to STM at the arch   PT Next Visit Plan L ankle ROM, propriception, gait, manual work to increase ROM, plantar fasciitis stretches        Problem List Patient Active Problem List   Diagnosis Date Noted  . Depression with anxiety 01/18/2015  . Sinusitis 12/05/2013  . Hyperlipidemia, mixed 05/20/2013  . Bilateral hand pain 12/13/2011   . Preventative health care 12/13/2011  . SACROILIITIS, LEFT 11/23/2010  . Overweight 10/11/2010  . UNSPECIFIED ANEMIA 10/11/2010  . Allergic rhinitis 10/11/2010  . PLANTAR FASCIITIS, LEFT 10/11/2010  . ELEVATED BP READING WITHOUT DX HYPERTENSION 10/11/2010  . HEART MURMUR, HX OF 10/11/2010  . [place] OF 10/11/2010    Stark Bray, DPT, CMP 08/10/2015, 4:13 PM  [facility] [address]  [place], [place], [address] Phone: [phone]   Fax:  [phone]  Name: Breanna Bridges MRN: [id number] Date of Birth: February 19, 1956

## 2015-08-13 ENCOUNTER — Ambulatory Visit: Payer: Managed Care, Other (non HMO) | Attending: Orthopaedic Surgery | Admitting: Physical Therapy

## 2015-08-13 DIAGNOSIS — M25572 Pain in left ankle and joints of left foot: Secondary | ICD-10-CM | POA: Diagnosis not present

## 2015-08-13 DIAGNOSIS — R262 Difficulty in walking, not elsewhere classified: Secondary | ICD-10-CM

## 2015-08-13 NOTE — Therapy (Signed)
[facility] [address]  [place], [place], [address] Phone: [phone]   Fax:  [phone]  Physical Therapy Treatment  Patient Details  Name: Breanna Bridges MRN: [id number] Date of Birth: 1955/10/03 Referring Provider: Dr. Kathi Simpers  Encounter Date: 08/13/2015      PT End of Session - 08/13/15 0805    Visit Number 8   Number of Visits 12   Date for PT Re-Evaluation 08/31/15   PT Start Time 0800   PT Stop Time 0846   PT Time Calculation (min) 46 min   Activity Tolerance Patient tolerated treatment well   Behavior During Therapy [facility] for tasks assessed/performed      Past Medical History  Diagnosis Date  . Depression     h/o depression/anxiety  . HSV infection   . Abnormal Pap smear     CIN I-LEEP  . GERD (gastroesophageal reflux disease)   . Stress fracture 2008    left foot  . Trigger finger of left hand     ring finger  . Other and unspecified hyperlipidemia 05/20/2013  . Depression with anxiety 01/18/2015    Past Surgical History  Procedure Laterality Date  . Hammer toe surgery  10+ yrs    left foot  . Leep  15+ yrs    normal paps since then  . Refractive surgery  5 yrs ago    b/l  . Foot surgery Left 04/15/14    left foot-bone spur, Dr. Sharol Given; second surgery 01/2015 to correct prior surgery  . Foot surgery Left 5/16    Dr Beckey Downing at [facility]    There were no vitals filed for this visit.  Visit Diagnosis:  Pain in joint, ankle and foot, left  Difficulty walking      Subjective Assessment - 08/13/15 0804    Subjective Patient reports pain was very bad yesterday (up to 7/10) and foot was much more swollen.   Currently in Pain? Yes   Pain Score 2    Pain Location Foot   Pain Orientation Left   Pain Descriptors / Indicators Aching                         [facility] Adult PT Treatment/Exercise - 08/13/15 0800    Manual Therapy   Manual Therapy Edema  management;Taping;Passive ROM   Manual therapy comments STM and retromassage throughout R medial foot and lower leg due to pain to R abductor hallucis and posterior tib tendon along with pitting edema to medial ankle and lower leg   Edema Management --   Passive ROM PROM into DF with plantar fascia stretch   Kinesiotex Edema   Kinesiotix   Edema Kinesiology Tape: 3 strips - 75% medial foot to lateral foot, 50% Achilles, 75% stirrup   Ankle Exercises: Aerobic   Stationary Bike Nustep L5 x5 min    Ankle Exercises: Stretches   Soleus Stretch Limitations with Astronomer Stretch 2 reps;30 seconds;Limitations   Ankle Exercises: Standing   Vector Stance Limitations rhythmic stab green tb SL both directions 2x10   Other Standing Ankle Exercises side steps with green TB around midfoot bilat 25 ft x 4   "twinge" moving to the Lt    Ankle Exercises: Seated   BAPS Sitting;Level 3;10 reps;Weight   BAPS Limitations CW/CCW w/o weights; PF/DF, IV/EV with 5# at 4 lateral locations x10  PT Short Term Goals - 07/29/15 1506    PT SHORT TERM GOAL #1   Title pt will be independent with initial HEP   Time 1   Period Weeks   Status Achieved           PT Long Term Goals - 08/13/15 [id number]    PT LONG TERM GOAL #1   Title pt will be independent with final HEP   Status On-going   PT LONG TERM GOAL #2   Title pt will improve L ankle dorsiflexion to 10degrees or more   Status On-going   PT LONG TERM GOAL #3   Title pt will perform single leg stance on foam without increased sway compared to the well leg   Status On-going   PT LONG TERM GOAL #4   Title pt will resume walking 10000 steps per day without increased L foot pain   Status On-going               Plan - 08/13/15 0858    Clinical Impression Statement Continued fluctuation in pain and edema with bad day yesterday, potentially associated with inclement weather, but improved today. Improving tolerance  for medial/lateral strengthening with BAPS but still with limited control and stability. Trial of kinesiology tape to L foot today due to edema throughout midfoot and med/lat ankle along with continued pain.   PT Next Visit Plan Assess response to taping, L ankle ROM, propriception, gait, manual work to increase ROM, plantar fasciitis stretches, taping if benefit noted   Consulted and Agree with Plan of Care Patient        Problem List Patient Active Problem List   Diagnosis Date Noted  . Depression with anxiety 01/18/2015  . Sinusitis 12/05/2013  . Hyperlipidemia, mixed 05/20/2013  . Bilateral hand pain 12/13/2011  . Preventative health care 12/13/2011  . SACROILIITIS, LEFT 11/23/2010  . Overweight 10/11/2010  . UNSPECIFIED ANEMIA 10/11/2010  . Allergic rhinitis 10/11/2010  . PLANTAR FASCIITIS, LEFT 10/11/2010  . ELEVATED BP READING WITHOUT DX HYPERTENSION 10/11/2010  . HEART MURMUR, HX OF 10/11/2010  . [place] OF 10/11/2010    Percival Spanish, PT, MPT 08/13/2015, 9:06 AM  [facility] [address]  [place], [place], [address] Phone: [phone]   Fax:  [phone]  Name: Breanna Bridges MRN: [id number] Date of Birth: 1956-03-03

## 2015-08-25 ENCOUNTER — Ambulatory Visit: Payer: Managed Care, Other (non HMO) | Admitting: Physical Therapy

## 2015-08-25 DIAGNOSIS — R262 Difficulty in walking, not elsewhere classified: Secondary | ICD-10-CM

## 2015-08-25 DIAGNOSIS — M25572 Pain in left ankle and joints of left foot: Secondary | ICD-10-CM

## 2015-08-25 NOTE — Therapy (Addendum)
[facility] [address]  [place], [place], [address] Phone: [phone]   Fax:  [phone]  Physical Therapy Treatment  Patient Details  Name: Breanna Bridges MRN: [id number] Date of Birth: 1955-10-28 Referring Provider: Dr. Kathi Simpers  Encounter Date: 08/25/2015      PT End of Session - 08/25/15 0809    Visit Number 9   Number of Visits 12   Date for PT Re-Evaluation 08/31/15   PT Start Time 0803   PT Stop Time 0842   PT Time Calculation (min) 39 min   Activity Tolerance Patient tolerated treatment well   Behavior During Therapy [facility] for tasks assessed/performed      Past Medical History  Diagnosis Date  . Depression     h/o depression/anxiety  . HSV infection   . Abnormal Pap smear     CIN I-LEEP  . GERD (gastroesophageal reflux disease)   . Stress fracture 2008    left foot  . Trigger finger of left hand     ring finger  . Other and unspecified hyperlipidemia 05/20/2013  . Depression with anxiety 01/18/2015    Past Surgical History  Procedure Laterality Date  . Hammer toe surgery  10+ yrs    left foot  . Leep  15+ yrs    normal paps since then  . Refractive surgery  5 yrs ago    b/l  . Foot surgery Left 04/15/14    left foot-bone spur, Dr. Sharol Given; second surgery 01/2015 to correct prior surgery  . Foot surgery Left 5/16    Dr Beckey Downing at [facility]    There were no vitals filed for this visit.  Visit Diagnosis:  Pain in joint, ankle and foot, left  Difficulty walking      Subjective Assessment - 08/25/15 0806    Subjective Reports taping from last visit only lasted 2 days but did seem to help. Has noticed able to walk more without pain. Patient inquiring about updating HEP and trying exercises from home over holidays as work schedule very busy making it difficult to take time off to come to therapy.   How long can you walk comfortably? ~6000 steps but reports "[employer]Bit has been acting up so not  sure how accurate that is"   Currently in Pain? No/denies            [facility] PT Assessment - 08/25/15 0803    ROM / Strength   AROM / PROM / Strength AROM   AROM   AROM Assessment Site Ankle   Right/Left Ankle Left   Left Ankle Dorsiflexion 12   Left Ankle Plantar Flexion 39   Left Ankle Inversion 20   Left Ankle Eversion 19   PROM   Left Ankle Dorsiflexion 12   Strength   Strength Assessment Site Ankle   Right/Left Ankle Left   Left Ankle Dorsiflexion 5/5   Left Ankle Plantar Flexion 5/5   Left Ankle Inversion 5/5   Left Ankle Eversion 4/5                     [facility] Adult PT Treatment/Exercise - 08/25/15 0803    Ankle Exercises: Aerobic   Stationary Bike Nustep L5 x5 min    Ankle Exercises: Stretches   Gastroc Stretch 2 reps;30 seconds;Limitations   Gastroc Stretch Limitations with prostretch   Ankle Exercises: Supine   T-Band 4 way ankle blue TB x 20   Ankle Exercises: Standing  Vector Stance Limitations rhythmic stab green tb SL both directions 2x10   Other Standing Ankle Exercises B concentric, L eccentric heel raises 2x10                PT Education - 08/25/15 0845    Education provided Yes   Education Details Updated HEP   Person(s) Educated Patient   Methods Explanation;Demonstration;Handout   Comprehension Verbalized understanding;Returned demonstration          PT Short Term Goals - 07/29/15 1506    PT SHORT TERM GOAL #1   Title pt will be independent with initial HEP   Time 1   Period Weeks   Status Achieved           PT Long Term Goals - 08/25/15 0845    PT LONG TERM GOAL #1   Title pt will be independent with final HEP   Status On-going   PT LONG TERM GOAL #2   Title pt will improve L ankle dorsiflexion to 10degrees or more   Status Achieved   PT LONG TERM GOAL #3   Title pt will perform single leg stance on foam without increased sway compared to the well leg   Status On-going   PT LONG TERM GOAL #4   Title  pt will resume walking 10000 steps per day without increased L foot pain   Status On-going               Plan - 08/25/15 0846    Clinical Impression Statement Patient reporting positive response to kinesiotape although taping only lasted 2 days. Patient currently without painPatient reporting able to walk with less left ankle/foot pain noted and at times no pain but has not yet been able to increase back to 10,000 steps per day. Left ankle ROM improved to [facility] but still feels tight in DF per patient. Strength 5/5 except 4/5 with eversion. Today's treatment focused on HEP update as patient wanting to try exercises on own over holidays due to busy schedule. Patient able to demonstrate all exerices appropriately. Patient informed of need to return within 30 days, otherwise therapy episode will close and new referral will be needed.   PT Next Visit Plan L ankle ROM, propriception, gait, manual work to increase ROM, plantar fasciitis stretches, taping as needed, FOTO - visit 10   PT Home Exercise Plan HEP updated   Consulted and Agree with Plan of Care Patient        Problem List Patient Active Problem List   Diagnosis Date Noted  . Depression with anxiety 01/18/2015  . Sinusitis 12/05/2013  . Hyperlipidemia, mixed 05/20/2013  . Bilateral hand pain 12/13/2011  . Preventative health care 12/13/2011  . SACROILIITIS, LEFT 11/23/2010  . Overweight 10/11/2010  . UNSPECIFIED ANEMIA 10/11/2010  . Allergic rhinitis 10/11/2010  . PLANTAR FASCIITIS, LEFT 10/11/2010  . ELEVATED BP READING WITHOUT DX HYPERTENSION 10/11/2010  . HEART MURMUR, HX OF 10/11/2010  . [place] OF 10/11/2010    Percival Spanish, PT, MPT 08/25/2015, 9:15 AM  [facility] [address]  [place], [place], [address] Phone: [phone]   Fax:  [phone]  Name: Breanna Bridges MRN: [id number] Date of Birth: Jul 13, 1956    PHYSICAL THERAPY DISCHARGE  SUMMARY  Visits from Start of Care: 9  Current functional level related to goals / functional outcomes:   As of last PT visit, patient was without pain in L ankle. Patient reported ability  to walk with less left ankle/foot pain noted and at times no pain but had not yet been able to increase back to 10,000 steps per day. Left ankle ROM had improved to [facility] but still felt tight in DF per patient. Left ankle strength was 5/5 except 4/5 with eversion. HEP was updated as patient wanting to try exercises on own over holidays due to busy schedule and patient was able to demonstrate all exerices appropriately. Patient did not return after holidays, thus unable to formally assess functional and goal status as discharge.   Remaining deficits:  Unable to assess secondary to failure to return to PT   Education / Equipment:  HEP Plan: Patient agrees to discharge.  Patient goals were partially met. Patient is being discharged due to not returning since the last visit.  ?????       Percival Spanish, PT, MPT 10/22/2015, 5:12 PM  [facility] [address]  [place], [place], [address] Phone: [phone]   Fax:  [phone]

## 2015-08-27 ENCOUNTER — Ambulatory Visit: Payer: Managed Care, Other (non HMO) | Admitting: Physical Therapy

## 2015-08-31 ENCOUNTER — Ambulatory Visit: Payer: Managed Care, Other (non HMO) | Admitting: Physical Therapy

## 2015-09-02 ENCOUNTER — Ambulatory Visit: Payer: Managed Care, Other (non HMO) | Admitting: Physical Therapy

## 2016-01-19 ENCOUNTER — Other Ambulatory Visit (INDEPENDENT_AMBULATORY_CARE_PROVIDER_SITE_OTHER): Payer: Managed Care, Other (non HMO)

## 2016-01-19 DIAGNOSIS — Z0000 Encounter for general adult medical examination without abnormal findings: Secondary | ICD-10-CM | POA: Diagnosis not present

## 2016-01-19 DIAGNOSIS — R030 Elevated blood-pressure reading, without diagnosis of hypertension: Secondary | ICD-10-CM

## 2016-01-19 LAB — LIPID PANEL
CHOLESTEROL: 221 mg/dL — AB (ref 0–200)
HDL: 91 mg/dL (ref >39.00)
LDL CALC: 117 mg/dL — AB (ref 0–99)
NonHDL: 129.59
Total CHOL/HDL Ratio: 2
Triglycerides: 62 mg/dL (ref 0.0–149.0)
VLDL: 12.4 mg/dL (ref 0.0–40.0)

## 2016-01-19 LAB — COMPREHENSIVE METABOLIC PANEL
ALBUMIN: 4.4 g/dL (ref 3.5–5.2)
ALT: 15 U/L (ref 0–35)
AST: 17 U/L (ref 0–37)
Alkaline Phosphatase: 88 U/L (ref 39–117)
BILIRUBIN TOTAL: 0.5 mg/dL (ref 0.2–1.2)
BUN: 18 mg/dL (ref 6–23)
CHLORIDE: 103 meq/L (ref 96–112)
CO2: 25 meq/L (ref 19–32)
CREATININE: 0.75 mg/dL (ref 0.40–1.20)
Calcium: 9.5 mg/dL (ref 8.4–10.5)
GFR: 83.88 mL/min (ref >60.00)
Glucose, Bld: 106 mg/dL — ABNORMAL HIGH (ref 70–99)
POTASSIUM: 3.8 meq/L (ref 3.5–5.1)
Sodium: 141 mEq/L (ref 135–145)
TOTAL PROTEIN: 7.1 g/dL (ref 6.0–8.3)

## 2016-01-19 LAB — CBC
HCT: 37.6 % (ref 36.0–46.0)
Hemoglobin: 12.7 g/dL (ref 12.0–15.0)
MCHC: 33.8 g/dL (ref 30.0–36.0)
MCV: 87.3 fl (ref 78.0–100.0)
Platelets: 217 10*3/uL (ref 150.0–400.0)
RBC: 4.3 Mil/uL (ref 3.87–5.11)
RDW: 14.1 % (ref 11.5–15.5)
WBC: 3.8 10*3/uL — ABNORMAL LOW (ref 4.0–10.5)

## 2016-01-19 LAB — TSH: TSH: 3.29 u[IU]/mL (ref 0.35–4.50)

## 2016-01-20 ENCOUNTER — Telehealth: Payer: Self-pay | Admitting: *Deleted

## 2016-01-20 NOTE — Telephone Encounter (Signed)
Unable to reach patient at time of pre-visit call. Left message for patient to return call when available.  

## 2016-01-21 ENCOUNTER — Encounter: Payer: Self-pay | Admitting: Family Medicine

## 2016-01-21 ENCOUNTER — Ambulatory Visit (INDEPENDENT_AMBULATORY_CARE_PROVIDER_SITE_OTHER): Payer: Managed Care, Other (non HMO) | Admitting: Family Medicine

## 2016-01-21 VITALS — BP 120/80 | HR 97 | Temp 98.4°F | Ht 61.5 in | Wt 187.1 lb

## 2016-01-21 DIAGNOSIS — R739 Hyperglycemia, unspecified: Secondary | ICD-10-CM | POA: Diagnosis not present

## 2016-01-21 DIAGNOSIS — Z0000 Encounter for general adult medical examination without abnormal findings: Secondary | ICD-10-CM

## 2016-01-21 DIAGNOSIS — E782 Mixed hyperlipidemia: Secondary | ICD-10-CM

## 2016-01-21 DIAGNOSIS — E663 Overweight: Secondary | ICD-10-CM

## 2016-01-21 LAB — HEMOGLOBIN A1C: Hgb A1c MFr Bld: 5.5 % (ref 4.6–6.5)

## 2016-01-21 MED ORDER — PANTOPRAZOLE SODIUM 40 MG PO TBEC
40.0000 mg | DELAYED_RELEASE_TABLET | Freq: Every day | ORAL | Status: DC
Start: 2016-01-21 — End: 2017-04-04

## 2016-01-21 MED ORDER — VENLAFAXINE HCL ER 75 MG PO CP24: 75.0000 mg | ORAL_CAPSULE | Freq: Every day | ORAL | Status: DC

## 2016-01-21 MED ORDER — FLUTICASONE PROPIONATE 50 MCG/ACT NA SUSP: 1.0000 | Freq: Every day | NASAL | Status: DC

## 2016-01-21 NOTE — Assessment & Plan Note (Signed)
Patient encouraged to maintain heart healthy diet, regular exercise, adequate sleep. Consider daily probiotics. Take medications as prescribed. Given and reviewed copy of ACP documents from [employer] and encouraged to complete and return. Follows with GYN. Labs reviewed with patient

## 2016-01-21 NOTE — Assessment & Plan Note (Signed)
Encouraged heart healthy diet, increase exercise, avoid trans fats, consider a krill oil cap daily 

## 2016-01-21 NOTE — Patient Instructions (Signed)
Jobst stockings light weight 10-20 mmhg knee highs on in am and off in pm  Consider referral for podiatry.   Consider DASH diet can find websites and cook books  Preventive Care for Adults, Female A healthy lifestyle and preventive care can promote health and wellness. Preventive health guidelines for women include the following key practices.  A routine yearly physical is a good way to check with your health care provider about your health and preventive screening. It is a chance to share any concerns and updates on your health and to receive a thorough exam.  Visit your dentist for a routine exam and preventive care every 6 months. Brush your teeth twice a day and floss once a day. Good oral hygiene prevents tooth decay and gum disease.  The frequency of eye exams is based on your age, health, family medical history, use of contact lenses, and other factors. Follow your health care provider's recommendations for frequency of eye exams.  Eat a healthy diet. Foods like vegetables, fruits, whole grains, low-fat dairy products, and lean protein foods contain the nutrients you need without too many calories. Decrease your intake of foods high in solid fats, added sugars, and salt. Eat the right amount of calories for you.Get information about a proper diet from your health care provider, if necessary.  Regular physical exercise is one of the most important things you can do for your health. Most adults should get at least 150 minutes of moderate-intensity exercise (any activity that increases your heart rate and causes you to sweat) each week. In addition, most adults need muscle-strengthening exercises on 2 or more days a week.  Maintain a healthy weight. The body mass index (BMI) is a screening tool to identify possible weight problems. It provides an estimate of body fat based on height and weight. Your health care provider can find your BMI and can help you achieve or maintain a healthy  weight.For adults 20 years and older:  A BMI below 18.5 is considered underweight.  A BMI of 18.5 to 24.9 is normal.  A BMI of 25 to 29.9 is considered overweight.  A BMI of 30 and above is considered obese.  Maintain normal blood lipids and cholesterol levels by exercising and minimizing your intake of saturated fat. Eat a balanced diet with plenty of fruit and vegetables. Blood tests for lipids and cholesterol should begin at age 45 and be repeated every 5 years. If your lipid or cholesterol levels are high, you are over 50, or you are at high risk for heart disease, you may need your cholesterol levels checked more frequently.Ongoing high lipid and cholesterol levels should be treated with medicines if diet and exercise are not working.  If you smoke, find out from your health care provider how to quit. If you do not use tobacco, do not start.  Lung cancer screening is recommended for adults aged 5-80 years who are at high risk for developing lung cancer because of a history of smoking. A yearly low-dose CT scan of the lungs is recommended for people who have at least a 30-pack-year history of smoking and are a current smoker or have quit within the past 15 years. A pack year of smoking is smoking an average of 1 pack of cigarettes a day for 1 year (for example: 1 pack a day for 30 years or 2 packs a day for 15 years). Yearly screening should continue until the smoker has stopped smoking for at least 15 years. Yearly  screening should be stopped for people who develop a health problem that would prevent them from having lung cancer treatment.  If you are pregnant, do not drink alcohol. If you are breastfeeding, be very cautious about drinking alcohol. If you are not pregnant and choose to drink alcohol, do not have more than 1 drink per day. One drink is considered to be 12 ounces (355 mL) of beer, 5 ounces (148 mL) of wine, or 1.5 ounces (44 mL) of liquor.  Avoid use of street drugs. Do not  share needles with anyone. Ask for help if you need support or instructions about stopping the use of drugs.  High blood pressure causes heart disease and increases the risk of stroke. Your blood pressure should be checked at least every 1 to 2 years. Ongoing high blood pressure should be treated with medicines if weight loss and exercise do not work.  If you are 23-70 years old, ask your health care provider if you should take aspirin to prevent strokes.  Diabetes screening is done by taking a blood sample to check your blood glucose level after you have not eaten for a certain period of time (fasting). If you are not overweight and you do not have risk factors for diabetes, you should be screened once every 3 years starting at age 34. If you are overweight or obese and you are 36-51 years of age, you should be screened for diabetes every year as part of your cardiovascular risk assessment.  Breast cancer screening is essential preventive care for women. You should practice "breast self-awareness." This means understanding the normal appearance and feel of your breasts and may include breast self-examination. Any changes detected, no matter how small, should be reported to a health care provider. Women in their 83s and 30s should have a clinical breast exam (CBE) by a health care provider as part of a regular health exam every 1 to 3 years. After age 1, women should have a CBE every year. Starting at age 64, women should consider having a mammogram (breast X-ray test) every year. Women who have a family history of breast cancer should talk to their health care provider about genetic screening. Women at a high risk of breast cancer should talk to their health care providers about having an MRI and a mammogram every year.  Breast cancer gene (BRCA)-related cancer risk assessment is recommended for women who have family members with BRCA-related cancers. BRCA-related cancers include breast, ovarian, tubal,  and peritoneal cancers. Having family members with these cancers may be associated with an increased risk for harmful changes (mutations) in the breast cancer genes BRCA1 and BRCA2. Results of the assessment will determine the need for genetic counseling and BRCA1 and BRCA2 testing.  Your health care provider may recommend that you be screened regularly for cancer of the pelvic organs (ovaries, uterus, and vagina). This screening involves a pelvic examination, including checking for microscopic changes to the surface of your cervix (Pap test). You may be encouraged to have this screening done every 3 years, beginning at age 88.  For women ages 2-65, health care providers may recommend pelvic exams and Pap testing every 3 years, or they may recommend the Pap and pelvic exam, combined with testing for human papilloma virus (HPV), every 5 years. Some types of HPV increase your risk of cervical cancer. Testing for HPV may also be done on women of any age with unclear Pap test results.  Other health care providers may not recommend  any screening for nonpregnant women who are considered low risk for pelvic cancer and who do not have symptoms. Ask your health care provider if a screening pelvic exam is right for you.  If you have had past treatment for cervical cancer or a condition that could lead to cancer, you need Pap tests and screening for cancer for at least 20 years after your treatment. If Pap tests have been discontinued, your risk factors (such as having a new sexual partner) need to be reassessed to determine if screening should resume. Some women have medical problems that increase the chance of getting cervical cancer. In these cases, your health care provider may recommend more frequent screening and Pap tests.  Colorectal cancer can be detected and often prevented. Most routine colorectal cancer screening begins at the age of 84 years and continues through age 56 years. However, your health care  provider may recommend screening at an earlier age if you have risk factors for colon cancer. On a yearly basis, your health care provider may provide home test kits to check for hidden blood in the stool. Use of a small camera at the end of a tube, to directly examine the colon (sigmoidoscopy or colonoscopy), can detect the earliest forms of colorectal cancer. Talk to your health care provider about this at age 63, when routine screening begins. Direct exam of the colon should be repeated every 5-10 years through age 16 years, unless early forms of precancerous polyps or small growths are found.  People who are at an increased risk for hepatitis B should be screened for this virus. You are considered at high risk for hepatitis B if:  You were born in a country where hepatitis B occurs often. Talk with your health care provider about which countries are considered high risk.  Your parents were born in a high-risk country and you have not received a shot to protect against hepatitis B (hepatitis B vaccine).  You have HIV or AIDS.  You use needles to inject street drugs.  You live with, or have sex with, someone who has hepatitis B.  You get hemodialysis treatment.  You take certain medicines for conditions like cancer, organ transplantation, and autoimmune conditions.  Hepatitis C blood testing is recommended for all people born from [birth year] through 1965 and any individual with known risks for hepatitis C.  Practice safe sex. Use condoms and avoid high-risk sexual practices to reduce the spread of sexually transmitted infections (STIs). STIs include gonorrhea, chlamydia, syphilis, trichomonas, herpes, HPV, and human immunodeficiency virus (HIV). Herpes, HIV, and HPV are viral illnesses that have no cure. They can result in disability, cancer, and death.  You should be screened for sexually transmitted illnesses (STIs) including gonorrhea and chlamydia if:  You are sexually active and are younger  than 24 years.  You are older than 24 years and your health care provider tells you that you are at risk for this type of infection.  Your sexual activity has changed since you were last screened and you are at an increased risk for chlamydia or gonorrhea. Ask your health care provider if you are at risk.  If you are at risk of being infected with HIV, it is recommended that you take a prescription medicine daily to prevent HIV infection. This is called preexposure prophylaxis (PrEP). You are considered at risk if:  You are sexually active and do not regularly use condoms or know the HIV status of your partner(s).  You take drugs by  injection.  You are sexually active with a partner who has HIV.  Talk with your health care provider about whether you are at high risk of being infected with HIV. If you choose to begin PrEP, you should first be tested for HIV. You should then be tested every 3 months for as long as you are taking PrEP.  Osteoporosis is a disease in which the bones lose minerals and strength with aging. This can result in serious bone fractures or breaks. The risk of osteoporosis can be identified using a bone density scan. Women ages 52 years and over and women at risk for fractures or osteoporosis should discuss screening with their health care providers. Ask your health care provider whether you should take a calcium supplement or vitamin D to reduce the rate of osteoporosis.  Menopause can be associated with physical symptoms and risks. Hormone replacement therapy is available to decrease symptoms and risks. You should talk to your health care provider about whether hormone replacement therapy is right for you.  Use sunscreen. Apply sunscreen liberally and repeatedly throughout the day. You should seek shade when your shadow is shorter than you. Protect yourself by wearing long sleeves, pants, a wide-brimmed hat, and sunglasses year round, whenever you are outdoors.  Once a  month, do a whole body skin exam, using a mirror to look at the skin on your back. Tell your health care provider of new moles, moles that have irregular borders, moles that are larger than a pencil eraser, or moles that have changed in shape or color.  Stay current with required vaccines (immunizations).  Influenza vaccine. All adults should be immunized every year.  Tetanus, diphtheria, and acellular pertussis (Td, Tdap) vaccine. Pregnant women should receive 1 dose of Tdap vaccine during each pregnancy. The dose should be obtained regardless of the length of time since the last dose. Immunization is preferred during the 27th-36th week of gestation. An adult who has not previously received Tdap or who does not know her vaccine status should receive 1 dose of Tdap. This initial dose should be followed by tetanus and diphtheria toxoids (Td) booster doses every 10 years. Adults with an unknown or incomplete history of completing a 3-dose immunization series with Td-containing vaccines should begin or complete a primary immunization series including a Tdap dose. Adults should receive a Td booster every 10 years.  Varicella vaccine. An adult without evidence of immunity to varicella should receive 2 doses or a second dose if she has previously received 1 dose. Pregnant females who do not have evidence of immunity should receive the first dose after pregnancy. This first dose should be obtained before leaving the health care facility. The second dose should be obtained 4-8 weeks after the first dose.  Human papillomavirus (HPV) vaccine. Females aged 13-26 years who have not received the vaccine previously should obtain the 3-dose series. The vaccine is not recommended for use in pregnant females. However, pregnancy testing is not needed before receiving a dose. If a female is found to be pregnant after receiving a dose, no treatment is needed. In that case, the remaining doses should be delayed until after the  pregnancy. Immunization is recommended for any person with an immunocompromised condition through the age of 70 years if she did not get any or all doses earlier. During the 3-dose series, the second dose should be obtained 4-8 weeks after the first dose. The third dose should be obtained 24 weeks after the first dose and 16 weeks  after the second dose.  Zoster vaccine. One dose is recommended for adults aged 14 years or older unless certain conditions are present.  Measles, mumps, and rubella (MMR) vaccine. Adults born before [birth year] generally are considered immune to measles and mumps. Adults born in [birth year] or later should have 1 or more doses of MMR vaccine unless there is a contraindication to the vaccine or there is laboratory evidence of immunity to each of the three diseases. A routine second dose of MMR vaccine should be obtained at least 28 days after the first dose for students attending postsecondary schools, health care workers, or international travelers. People who received inactivated measles vaccine or an unknown type of measles vaccine during 1963-1967 should receive 2 doses of MMR vaccine. People who received inactivated mumps vaccine or an unknown type of mumps vaccine before 1979 and are at high risk for mumps infection should consider immunization with 2 doses of MMR vaccine. For females of childbearing age, rubella immunity should be determined. If there is no evidence of immunity, females who are not pregnant should be vaccinated. If there is no evidence of immunity, females who are pregnant should delay immunization until after pregnancy. Unvaccinated health care workers born before [birth year] who lack laboratory evidence of measles, mumps, or rubella immunity or laboratory confirmation of disease should consider measles and mumps immunization with 2 doses of MMR vaccine or rubella immunization with 1 dose of MMR vaccine.  Pneumococcal 13-valent conjugate (PCV13) vaccine. When indicated, a person  who is uncertain of his immunization history and has no record of immunization should receive the PCV13 vaccine. All adults 79 years of age and older should receive this vaccine. An adult aged 36 years or older who has certain medical conditions and has not been previously immunized should receive 1 dose of PCV13 vaccine. This PCV13 should be followed with a dose of pneumococcal polysaccharide (PPSV23) vaccine. Adults who are at high risk for pneumococcal disease should obtain the PPSV23 vaccine at least 8 weeks after the dose of PCV13 vaccine. Adults older than 60 years of age who have normal immune system function should obtain the PPSV23 vaccine dose at least 1 year after the dose of PCV13 vaccine.  Pneumococcal polysaccharide (PPSV23) vaccine. When PCV13 is also indicated, PCV13 should be obtained first. All adults aged 49 years and older should be immunized. An adult younger than age 78 years who has certain medical conditions should be immunized. Any person who resides in a nursing home or long-term care facility should be immunized. An adult smoker should be immunized. People with an immunocompromised condition and certain other conditions should receive both PCV13 and PPSV23 vaccines. People with human immunodeficiency virus (HIV) infection should be immunized as soon as possible after diagnosis. Immunization during chemotherapy or radiation therapy should be avoided. Routine use of PPSV23 vaccine is not recommended for American Indians, [address], or people younger than 65 years unless there are medical conditions that require PPSV23 vaccine. When indicated, people who have unknown immunization and have no record of immunization should receive PPSV23 vaccine. One-time revaccination 5 years after the first dose of PPSV23 is recommended for people aged 19-64 years who have chronic kidney failure, nephrotic syndrome, asplenia, or immunocompromised conditions. People who received 1-2 doses of PPSV23  before age 53 years should receive another dose of PPSV23 vaccine at age 6 years or later if at least 5 years have passed since the previous dose. Doses of PPSV23 are not needed for people immunized with PPSV23  at or after age 5 years.  Meningococcal vaccine. Adults with asplenia or persistent complement component deficiencies should receive 2 doses of quadrivalent meningococcal conjugate (MenACWY-D) vaccine. The doses should be obtained at least 2 months apart. Microbiologists working with certain meningococcal bacteria, [employer] recruits, people at risk during an outbreak, and people who travel to or live in countries with a high rate of meningitis should be immunized. A first-year college student up through age 76 years who is living in a residence hall should receive a dose if she did not receive a dose on or after her 16th birthday. Adults who have certain high-risk conditions should receive one or more doses of vaccine.  Hepatitis A vaccine. Adults who wish to be protected from this disease, have certain high-risk conditions, work with hepatitis A-infected animals, work in hepatitis A research labs, or travel to or work in countries with a high rate of hepatitis A should be immunized. Adults who were previously unvaccinated and who anticipate close contact with an international adoptee during the first 60 days after arrival in the [place] from a country with a high rate of hepatitis A should be immunized.  Hepatitis B vaccine. Adults who wish to be protected from this disease, have certain high-risk conditions, may be exposed to blood or other infectious body fluids, are household contacts or sex partners of hepatitis B positive people, are clients or workers in certain care facilities, or travel to or work in countries with a high rate of hepatitis B should be immunized.  Haemophilus influenzae type b (Hib) vaccine. A previously unvaccinated person with asplenia or sickle cell disease or  having a scheduled splenectomy should receive 1 dose of Hib vaccine. Regardless of previous immunization, a recipient of a hematopoietic stem cell transplant should receive a 3-dose series 6-12 months after her successful transplant. Hib vaccine is not recommended for adults with HIV infection. Preventive Services / Frequency Ages 65 to 75 years  Blood pressure check.** / Every 3-5 years.  Lipid and cholesterol check.** / Every 5 years beginning at age 27.  Clinical breast exam.** / Every 3 years for women in their 9s and 43s.  BRCA-related cancer risk assessment.** / For women who have family members with a BRCA-related cancer (breast, ovarian, tubal, or peritoneal cancers).  Pap test.** / Every 2 years from ages 74 through 59. Every 3 years starting at age 10 through age 78 or 50 with a history of 3 consecutive normal Pap tests.  HPV screening.** / Every 3 years from ages 56 through ages 17 to 32 with a history of 3 consecutive normal Pap tests.  Hepatitis C blood test.** / For any individual with known risks for hepatitis C.  Skin self-exam. / Monthly.  Influenza vaccine. / Every year.  Tetanus, diphtheria, and acellular pertussis (Tdap, Td) vaccine.** / Consult your health care provider. Pregnant women should receive 1 dose of Tdap vaccine during each pregnancy. 1 dose of Td every 10 years.  Varicella vaccine.** / Consult your health care provider. Pregnant females who do not have evidence of immunity should receive the first dose after pregnancy.  HPV vaccine. / 3 doses over 6 months, if 31 and younger. The vaccine is not recommended for use in pregnant females. However, pregnancy testing is not needed before receiving a dose.  Measles, mumps, rubella (MMR) vaccine.** / You need at least 1 dose of MMR if you were born in 1957 or later. You may also need a 2nd dose. For females of  childbearing age, rubella immunity should be determined. If there is no evidence of immunity, females  who are not pregnant should be vaccinated. If there is no evidence of immunity, females who are pregnant should delay immunization until after pregnancy.  Pneumococcal 13-valent conjugate (PCV13) vaccine.** / Consult your health care provider.  Pneumococcal polysaccharide (PPSV23) vaccine.** / 1 to 2 doses if you smoke cigarettes or if you have certain conditions.  Meningococcal vaccine.** / 1 dose if you are age 68 to 44 years and a Market researcher living in a residence hall, or have one of several medical conditions, you need to get vaccinated against meningococcal disease. You may also need additional booster doses.  Hepatitis A vaccine.** / Consult your health care provider.  Hepatitis B vaccine.** / Consult your health care provider.  Haemophilus influenzae type b (Hib) vaccine.** / Consult your health care provider. Ages 75 to 52 years  Blood pressure check.** / Every year.  Lipid and cholesterol check.** / Every 5 years beginning at age 71 years.  Lung cancer screening. / Every year if you are aged 67-80 years and have a 30-pack-year history of smoking and currently smoke or have quit within the past 15 years. Yearly screening is stopped once you have quit smoking for at least 15 years or develop a health problem that would prevent you from having lung cancer treatment.  Clinical breast exam.** / Every year after age 75 years.  BRCA-related cancer risk assessment.** / For women who have family members with a BRCA-related cancer (breast, ovarian, tubal, or peritoneal cancers).  Mammogram.** / Every year beginning at age 5 years and continuing for as long as you are in good health. Consult with your health care provider.  Pap test.** / Every 3 years starting at age 80 years through age 58 or 61 years with a history of 3 consecutive normal Pap tests.  HPV screening.** / Every 3 years from ages 45 years through ages 82 to 18 years with a history of 3 consecutive normal  Pap tests.  Fecal occult blood test (FOBT) of stool. / Every year beginning at age 36 years and continuing until age 40 years. You may not need to do this test if you get a colonoscopy every 10 years.  Flexible sigmoidoscopy or colonoscopy.** / Every 5 years for a flexible sigmoidoscopy or every 10 years for a colonoscopy beginning at age 3 years and continuing until age 33 years.  Hepatitis C blood test.** / For all people born from [birth year] through 1965 and any individual with known risks for hepatitis C.  Skin self-exam. / Monthly.  Influenza vaccine. / Every year.  Tetanus, diphtheria, and acellular pertussis (Tdap/Td) vaccine.** / Consult your health care provider. Pregnant women should receive 1 dose of Tdap vaccine during each pregnancy. 1 dose of Td every 10 years.  Varicella vaccine.** / Consult your health care provider. Pregnant females who do not have evidence of immunity should receive the first dose after pregnancy.  Zoster vaccine.** / 1 dose for adults aged 62 years or older.  Measles, mumps, rubella (MMR) vaccine.** / You need at least 1 dose of MMR if you were born in 1957 or later. You may also need a second dose. For females of childbearing age, rubella immunity should be determined. If there is no evidence of immunity, females who are not pregnant should be vaccinated. If there is no evidence of immunity, females who are pregnant should delay immunization until after pregnancy.  Pneumococcal 13-valent conjugate (  PCV13) vaccine.** / Consult your health care provider.  Pneumococcal polysaccharide (PPSV23) vaccine.** / 1 to 2 doses if you smoke cigarettes or if you have certain conditions.  Meningococcal vaccine.** / Consult your health care provider.  Hepatitis A vaccine.** / Consult your health care provider.  Hepatitis B vaccine.** / Consult your health care provider.  Haemophilus influenzae type b (Hib) vaccine.** / Consult your health care provider. Ages 51 years  and over  Blood pressure check.** / Every year.  Lipid and cholesterol check.** / Every 5 years beginning at age 48 years.  Lung cancer screening. / Every year if you are aged 65-80 years and have a 30-pack-year history of smoking and currently smoke or have quit within the past 15 years. Yearly screening is stopped once you have quit smoking for at least 15 years or develop a health problem that would prevent you from having lung cancer treatment.  Clinical breast exam.** / Every year after age 41 years.  BRCA-related cancer risk assessment.** / For women who have family members with a BRCA-related cancer (breast, ovarian, tubal, or peritoneal cancers).  Mammogram.** / Every year beginning at age 38 years and continuing for as long as you are in good health. Consult with your health care provider.  Pap test.** / Every 3 years starting at age 69 years through age 74 or 65 years with 3 consecutive normal Pap tests. Testing can be stopped between 65 and 70 years with 3 consecutive normal Pap tests and no abnormal Pap or HPV tests in the past 10 years.  HPV screening.** / Every 3 years from ages 9 years through ages 77 or 107 years with a history of 3 consecutive normal Pap tests. Testing can be stopped between 65 and 70 years with 3 consecutive normal Pap tests and no abnormal Pap or HPV tests in the past 10 years.  Fecal occult blood test (FOBT) of stool. / Every year beginning at age 55 years and continuing until age 6 years. You may not need to do this test if you get a colonoscopy every 10 years.  Flexible sigmoidoscopy or colonoscopy.** / Every 5 years for a flexible sigmoidoscopy or every 10 years for a colonoscopy beginning at age 89 years and continuing until age 53 years.  Hepatitis C blood test.** / For all people born from [birth year] through 1965 and any individual with known risks for hepatitis C.  Osteoporosis screening.** / A one-time screening for women ages 34 years and over and  women at risk for fractures or osteoporosis.  Skin self-exam. / Monthly.  Influenza vaccine. / Every year.  Tetanus, diphtheria, and acellular pertussis (Tdap/Td) vaccine.** / 1 dose of Td every 10 years.  Varicella vaccine.** / Consult your health care provider.  Zoster vaccine.** / 1 dose for adults aged 54 years or older.  Pneumococcal 13-valent conjugate (PCV13) vaccine.** / Consult your health care provider.  Pneumococcal polysaccharide (PPSV23) vaccine.** / 1 dose for all adults aged 54 years and older.  Meningococcal vaccine.** / Consult your health care provider.  Hepatitis A vaccine.** / Consult your health care provider.  Hepatitis B vaccine.** / Consult your health care provider.  Haemophilus influenzae type b (Hib) vaccine.** / Consult your health care provider. ** Family history and personal history of risk and conditions may change your health care provider's recommendations.   This information is not intended to replace advice given to you by your health care provider. Make sure you discuss any questions you have with your health  care provider.   Document Released: 10/25/2001 Document Revised: 09/19/2014 Document Reviewed: 01/24/2011 Elsevier Interactive Patient Education [employer].

## 2016-01-21 NOTE — Progress Notes (Signed)
Pre visit review using our clinic review tool, if applicable. No additional management support is needed unless otherwise documented below in the visit note. 

## 2016-01-22 LAB — HEPATITIS C ANTIBODY: HCV Ab: NEGATIVE

## 2016-01-31 ENCOUNTER — Encounter: Payer: Self-pay | Admitting: Family Medicine

## 2016-01-31 DIAGNOSIS — R739 Hyperglycemia, unspecified: Secondary | ICD-10-CM

## 2016-01-31 HISTORY — DX: Hyperglycemia, unspecified: R73.9

## 2016-01-31 NOTE — Progress Notes (Signed)
Patient ID: Breanna Bridges, female   DOB: 1956/09/04, 60 y.o.   MRN: [id number]   Subjective:    Patient ID: Breanna Bridges, female    DOB: 02-26-1956, 60 y.o.   MRN: [id number]  Chief Complaint  Patient presents with  . Annual Exam    HPI Patient is in today for annual exam. No recent illness or hospitalization. Continues to struggle with arthritis with aches and pains, does get some relief with activity and Mobic. No polyuria or polydipsia. Follows with OB/GYN for her annual exam. No concerns  Past Medical History  Diagnosis Date  . Depression     h/o depression/anxiety  . HSV infection   . Abnormal Pap smear     CIN I-LEEP  . GERD (gastroesophageal reflux disease)   . Stress fracture 2008    left foot  . Trigger finger of left hand     ring finger  . Other and unspecified hyperlipidemia 05/20/2013  . Depression with anxiety 01/18/2015  . Hyperglycemia 01/31/2016    Past Surgical History  Procedure Laterality Date  . Hammer toe surgery  10+ yrs    left foot  . Leep  15+ yrs    normal paps since then  . Refractive surgery  5 yrs ago    b/l  . Foot surgery Left 04/15/14    left foot-bone spur, Dr. Sharol Given; second surgery 01/2015 to correct prior surgery  . Foot surgery Left 5/16    Dr Beckey Downing at [facility]    Family History  Problem Relation Age of Onset  . Other Mother     CHF  . Diabetes Mother     Borderline  . Hypertension Mother   . Obesity Mother   . Anxiety disorder Mother   . Other Father     CHF  . Obesity Sister   . Diabetes Sister     type 2  . Hypertension Sister   . Anxiety disorder Sister   . Diverticulosis Sister   . Hyperlipidemia Sister   . Obesity Brother   . Anxiety disorder Brother   . Cancer Maternal Aunt     breast  . Cancer Paternal Aunt     Breast  . Obesity Paternal Grandmother   . Other Paternal Grandfather     Committed suicide during the [place] Depression  . Obesity Sister   . Anxiety disorder Sister   . Fibrocystic breast disease  Sister   . Obesity Sister     Social History   Social History  . Marital Status: Married    Spouse Name: N/A  . Number of Children: N/A  . Years of Education: N/A   Occupational History  . Not on file.   Social History Main Topics  . Smoking status: Former Smoker -- 0.25 packs/day    Quit date: 09/13/1983  . Smokeless tobacco: Never Used  . Alcohol Use: 5.0 oz/week    10 Standard drinks or equivalent per week  . Drug Use: No  . Sexual Activity:    Partners: Male    Patent examiner Protection: None     Comment: no dietary restrictions, lives with husband, 4 dogs   Other Topics Concern  . Not on file   Social History Narrative    Outpatient Prescriptions Prior to Visit  Medication Sig Dispense Refill  . ALPRAZolam (XANAX) 0.25 MG tablet Take 1 tablet (0.25 mg total) by mouth 2 (two) times daily as needed for sleep or anxiety. 10 tablet 1  . aspirin  81 MG tablet Take 81 mg by mouth daily.      . calcium carbonate 200 MG capsule Take 400 mg by mouth daily.      . cetirizine (ZYRTEC) 10 MG tablet Take 10 mg by mouth daily.      Marland Kitchen estradiol (ESTRACE) 0.1 MG/GM vaginal cream 1 gram vaginally twice weekly 42.5 g 6  . famciclovir (FAMVIR) 250 MG tablet Take 1 tablet (250 mg total) by mouth 2 (two) times daily. 180 tablet 4  . multivitamin (THERAGRAN) per tablet Take 1 tablet by mouth daily.      . Omega-3 Fatty Acids (FISH OIL PO) Take by mouth.    . Probiotic Product (PROBIOTIC DAILY PO) Take by mouth daily.    . fluticasone (FLONASE) 50 MCG/ACT nasal spray Place 1 spray into both nostrils daily. In spring, summer, fall 48 g 3  . pantoprazole (PROTONIX) 40 MG tablet Take 1 tablet (40 mg total) by mouth daily. 90 tablet 3  . venlafaxine XR (EFFEXOR XR) 75 MG 24 hr capsule Take 1 capsule (75 mg total) by mouth daily with breakfast. 90 capsule 2  . meloxicam (MOBIC) 15 MG tablet Take 15 mg by mouth daily as needed. Reported on 01/21/2016     No facility-administered medications  prior to visit.    Allergies  Allergen Reactions  . Codeine     REACTION: nausea    Review of Systems  Constitutional: Negative for fever, chills and malaise/fatigue.  HENT: Negative for congestion and hearing loss.   Eyes: Negative for discharge.  Respiratory: Negative for cough, sputum production and shortness of breath.   Cardiovascular: Negative for chest pain, palpitations and leg swelling.  Gastrointestinal: Negative for heartburn, nausea, vomiting, abdominal pain, diarrhea, constipation and blood in stool.  Genitourinary: Negative for dysuria, urgency, frequency and hematuria.  Musculoskeletal: Negative for myalgias, back pain and falls.  Skin: Negative for rash.  Neurological: Negative for dizziness, sensory change, loss of consciousness, weakness and headaches.  Endo/Heme/Allergies: Negative for environmental allergies. Does not bruise/bleed easily.  Psychiatric/Behavioral: Negative for depression and suicidal ideas. The patient is not nervous/anxious and does not have insomnia.        Objective:    Physical Exam  Constitutional: She is oriented to person, place, and time. She appears well-developed and well-nourished. No distress.  HENT:  Head: Normocephalic and atraumatic.  Eyes: Conjunctivae are normal.  Neck: Neck supple. No thyromegaly present.  Cardiovascular: Normal rate, regular rhythm and normal heart sounds.   No murmur heard. Pulmonary/Chest: Effort normal and breath sounds normal. No respiratory distress.  Abdominal: Soft. Bowel sounds are normal. She exhibits no distension and no mass. There is no tenderness.  Musculoskeletal: She exhibits no edema.  Lymphadenopathy:    She has no cervical adenopathy.  Neurological: She is alert and oriented to person, place, and time.  Skin: Skin is warm and dry.  Psychiatric: She has a normal mood and affect. Her behavior is normal.    BP 120/80 mmHg  Pulse 97  Temp(Src) 98.4 F (36.9 C) (Oral)  Ht 5' 1.5"  (1.562 m)  Wt 187 lb 2 oz (84.879 kg)  BMI 34.79 kg/m2  SpO2 96%  LMP 03/02/2011 (Approximate) Wt Readings from Last 3 Encounters:  01/21/16 187 lb 2 oz (84.879 kg)  07/30/15 187 lb (84.823 kg)  01/13/15 185 lb 6 oz (84.086 kg)     Lab Results  Component Value Date   WBC 3.8* 01/19/2016   HGB 12.7 01/19/2016   HCT  37.6 01/19/2016   PLT 217.0 01/19/2016   GLUCOSE 106* 01/19/2016   CHOL 221* 01/19/2016   TRIG 62.0 01/19/2016   HDL 91.00 01/19/2016   LDLCALC 117* 01/19/2016   ALT 15 01/19/2016   AST 17 01/19/2016   NA 141 01/19/2016   K 3.8 01/19/2016   CL 103 01/19/2016   CREATININE 0.75 01/19/2016   BUN 18 01/19/2016   CO2 25 01/19/2016   TSH 3.29 01/19/2016   HGBA1C 5.5 01/21/2016    Lab Results  Component Value Date   TSH 3.29 01/19/2016   Lab Results  Component Value Date   WBC 3.8* 01/19/2016   HGB 12.7 01/19/2016   HCT 37.6 01/19/2016   MCV 87.3 01/19/2016   PLT 217.0 01/19/2016   Lab Results  Component Value Date   NA 141 01/19/2016   K 3.8 01/19/2016   CO2 25 01/19/2016   GLUCOSE 106* 01/19/2016   BUN 18 01/19/2016   CREATININE 0.75 01/19/2016   BILITOT 0.5 01/19/2016   ALKPHOS 88 01/19/2016   AST 17 01/19/2016   ALT 15 01/19/2016   PROT 7.1 01/19/2016   ALBUMIN 4.4 01/19/2016   CALCIUM 9.5 01/19/2016   GFR 83.88 01/19/2016   Lab Results  Component Value Date   CHOL 221* 01/19/2016   Lab Results  Component Value Date   HDL 91.00 01/19/2016   Lab Results  Component Value Date   LDLCALC 117* 01/19/2016   Lab Results  Component Value Date   TRIG 62.0 01/19/2016   Lab Results  Component Value Date   CHOLHDL 2 01/19/2016   Lab Results  Component Value Date   HGBA1C 5.5 01/21/2016       Assessment & Plan:   Problem List Items Addressed This Visit    Preventative health care - Primary    Patient encouraged to maintain heart healthy diet, regular exercise, adequate sleep. Consider daily probiotics. Take medications as  prescribed. Given and reviewed copy of ACP documents from [employer] and encouraged to complete and return. Follows with GYN. Labs reviewed with patient      Relevant Orders   Hepatitis C Antibody (Completed)   Overweight    Encouraged DASH diet, decrease po intake and increase exercise as tolerated. Needs 7-8 hours of sleep nightly. Avoid trans fats, eat small, frequent meals every 4-5 hours with lean proteins, complex carbs and healthy fats. Minimize simple carbs      Hyperlipidemia, mixed    Encouraged heart healthy diet, increase exercise, avoid trans fats, consider a krill oil cap daily      Hyperglycemia    hgba1c acceptable, minimize simple carbs. Increase exercise as tolerated.       Relevant Orders   Hemoglobin A1c (Completed)      I am having Ms. Ehle maintain her aspirin, calcium carbonate, multivitamin, cetirizine, meloxicam, Omega-3 Fatty Acids (FISH OIL PO), Probiotic Product (PROBIOTIC DAILY PO), ALPRAZolam, estradiol, famciclovir, venlafaxine XR, pantoprazole, and fluticasone.  Meds ordered this encounter  Medications  . venlafaxine XR (EFFEXOR XR) 75 MG 24 hr capsule    Sig: Take 1 capsule (75 mg total) by mouth daily with breakfast.    Dispense:  90 capsule    Refill:  3  . pantoprazole (PROTONIX) 40 MG tablet    Sig: Take 1 tablet (40 mg total) by mouth daily.    Dispense:  90 tablet    Refill:  3  . fluticasone (FLONASE) 50 MCG/ACT nasal spray    Sig: Place 1  spray into both nostrils daily. In spring, summer, fall    Dispense:  48 g    Refill:  3     Penni Homans, MD

## 2016-01-31 NOTE — Assessment & Plan Note (Signed)
Encouraged DASH diet, decrease po intake and increase exercise as tolerated. Needs 7-8 hours of sleep nightly. Avoid trans fats, eat small, frequent meals every 4-5 hours with lean proteins, complex carbs and healthy fats. Minimize simple carbs 

## 2016-01-31 NOTE — Assessment & Plan Note (Addendum)
hgba1c acceptable, minimize simple carbs. Increase exercise as tolerated.  

## 2016-03-10 ENCOUNTER — Other Ambulatory Visit: Payer: Self-pay | Admitting: Family Medicine

## 2016-04-22 ENCOUNTER — Ambulatory Visit: Payer: Managed Care, Other (non HMO) | Admitting: Family Medicine

## 2016-07-13 HISTORY — PX: ANKLE SURGERY: SHX546

## 2016-08-17 ENCOUNTER — Other Ambulatory Visit: Payer: Self-pay | Admitting: Obstetrics & Gynecology

## 2016-08-17 DIAGNOSIS — Z1231 Encounter for screening mammogram for malignant neoplasm of breast: Secondary | ICD-10-CM

## 2016-08-22 ENCOUNTER — Ambulatory Visit (HOSPITAL_BASED_OUTPATIENT_CLINIC_OR_DEPARTMENT_OTHER)
Admission: RE | Admit: 2016-08-22 | Discharge: 2016-08-22 | Disposition: A | Discharge status: Home / Self Care | Payer: Managed Care, Other (non HMO) | Source: Ambulatory Visit | Attending: Obstetrics & Gynecology | Admitting: Obstetrics & Gynecology

## 2016-08-22 DIAGNOSIS — Z1231 Encounter for screening mammogram for malignant neoplasm of breast: Secondary | ICD-10-CM

## 2016-08-23 ENCOUNTER — Other Ambulatory Visit: Payer: Self-pay | Admitting: Obstetrics & Gynecology

## 2016-08-23 NOTE — Telephone Encounter (Signed)
Medication refill request: Famciclovir Last AEX:  07/30/15 SM Next AEX: 11/18/16 SM Last MMG (if hormonal medication request): 08/22/16-No results in system yet.  Refill authorized: 07/30/15 #[id number]. Please advise. Thank you.

## 2016-08-24 ENCOUNTER — Other Ambulatory Visit: Payer: Self-pay | Admitting: Obstetrics & Gynecology

## 2016-08-24 DIAGNOSIS — R928 Other abnormal and inconclusive findings on diagnostic imaging of breast: Secondary | ICD-10-CM

## 2016-08-31 ENCOUNTER — Other Ambulatory Visit: Payer: Self-pay

## 2016-08-31 ENCOUNTER — Other Ambulatory Visit: Payer: Self-pay | Admitting: Obstetrics & Gynecology

## 2016-08-31 ENCOUNTER — Telehealth: Payer: Self-pay | Admitting: Obstetrics & Gynecology

## 2016-08-31 DIAGNOSIS — R928 Other abnormal and inconclusive findings on diagnostic imaging of breast: Secondary | ICD-10-CM

## 2016-08-31 DIAGNOSIS — R921 Mammographic calcification found on diagnostic imaging of breast: Secondary | ICD-10-CM

## 2016-08-31 NOTE — Telephone Encounter (Signed)
Patient was seen for screening mammogram on 08/22/2016. Left breast calcifications require further imaging with diagnostic mammogram. Order placed in EPIC for Dr.Silva to sign as Dr.Miller is out of the office today. Patient's appointment is scheduled for tomorrow at 9:20 am.

## 2016-08-31 NOTE — Telephone Encounter (Signed)
Order has been signed by me.   Cc- Dr. Sabra Heck

## 2016-08-31 NOTE — Telephone Encounter (Signed)
[facility] is asking for this patient's order to be seen ASAP for her appointment tomorrow. Order in [place].

## 2016-09-01 ENCOUNTER — Other Ambulatory Visit: Payer: Self-pay | Admitting: Obstetrics & Gynecology

## 2016-09-01 ENCOUNTER — Ambulatory Visit
Admission: RE | Admit: 2016-09-01 | Discharge: 2016-09-01 | Disposition: A | Discharge status: Home / Self Care | Payer: Managed Care, Other (non HMO) | Source: Ambulatory Visit | Attending: Obstetrics & Gynecology | Admitting: Obstetrics & Gynecology

## 2016-09-01 DIAGNOSIS — R921 Mammographic calcification found on diagnostic imaging of breast: Secondary | ICD-10-CM

## 2016-09-01 DIAGNOSIS — R928 Other abnormal and inconclusive findings on diagnostic imaging of breast: Secondary | ICD-10-CM

## 2016-09-12 HISTORY — PX: BREAST BIOPSY: SHX20

## 2016-09-14 ENCOUNTER — Ambulatory Visit
Admission: RE | Admit: 2016-09-14 | Discharge: 2016-09-14 | Disposition: A | Discharge status: Home / Self Care | Payer: Managed Care, Other (non HMO) | Source: Ambulatory Visit | Attending: Obstetrics and Gynecology | Admitting: Obstetrics and Gynecology

## 2016-09-14 ENCOUNTER — Ambulatory Visit
Admission: RE | Admit: 2016-09-14 | Discharge: 2016-09-14 | Disposition: A | Discharge status: Home / Self Care | Payer: Managed Care, Other (non HMO) | Source: Ambulatory Visit | Attending: Obstetrics & Gynecology | Admitting: Obstetrics & Gynecology

## 2016-09-14 ENCOUNTER — Other Ambulatory Visit: Payer: Self-pay | Admitting: Obstetrics and Gynecology

## 2016-09-14 DIAGNOSIS — R921 Mammographic calcification found on diagnostic imaging of breast: Secondary | ICD-10-CM

## 2016-11-18 ENCOUNTER — Ambulatory Visit (INDEPENDENT_AMBULATORY_CARE_PROVIDER_SITE_OTHER): Payer: Managed Care, Other (non HMO) | Admitting: Obstetrics & Gynecology

## 2016-11-18 ENCOUNTER — Encounter: Payer: Self-pay | Admitting: Obstetrics & Gynecology

## 2016-11-18 VITALS — BP 130/70 | HR 84 | Resp 16 | Ht 61.5 in | Wt 191.0 lb

## 2016-11-18 DIAGNOSIS — E2839 Other primary ovarian failure: Secondary | ICD-10-CM

## 2016-11-18 DIAGNOSIS — Z01419 Encounter for gynecological examination (general) (routine) without abnormal findings: Secondary | ICD-10-CM | POA: Diagnosis not present

## 2016-11-18 MED ORDER — FAMCICLOVIR 250 MG PO TABS: 250.0000 mg | ORAL_TABLET | Freq: Two times a day (BID) | ORAL | 4 refills | Status: DC

## 2016-11-18 NOTE — Progress Notes (Signed)
61 y.o. G0P0000 MarriedCaucasianF here for annual exam.  Has had two more foot surgeries.  She is now having accupuncture in her foot.    Going to see Jonette Mate next week to help with a program for exercise.  Recently joined [employer].    Patient's last menstrual period was 03/02/2011 (approximate).          Sexually active: Yes.    The current method of family planning is post menopausal status.    Exercising: No.  The patient does not participate in regular exercise at present. Smoker:  no  Health Maintenance: Pap:  07/30/15 Neg with neg HR HPV, 06/19/14 Neg  History of abnormal Pap:  Yes, LEEP CIN 1, 1995 MMG:08/22/16 Screening BIRADS0:incomplete. 09/14/16 STEREOTACTIC CORE NEEDLE BIOPSY:  Pathology revealed FIBROCYSTIC CHANGES WITH CALCIFICATIONS of the Left breast. Colonoscopy:  12/11/09 Normal - f/u 10 years  BMD:   Never TDaP:  2013 Pneumonia vaccine(s):  No Zostavax:   2014 Hep C testing: 01/21/16 neg  Screening Labs: PCP, Urine today: not collected    reports that she quit smoking about 33 years ago. She smoked 0.25 packs per day. She has never used smokeless tobacco. She reports that she drinks about 6.0 oz of alcohol per week . She reports that she does not use drugs.  Past Medical History:  Diagnosis Date  . Abnormal Pap smear    CIN I-LEEP  . Depression    h/o depression/anxiety  . Depression with anxiety 01/18/2015  . GERD (gastroesophageal reflux disease)   . HSV infection   . Hyperglycemia 01/31/2016  . Other and unspecified hyperlipidemia 05/20/2013  . Stress fracture 2008   left foot  . Trigger finger of left hand    ring finger    Past Surgical History:  Procedure Laterality Date  . ANKLE SURGERY Left 07/2016  . FOOT SURGERY Left 04/15/14   left foot-bone spur, Dr. Sharol Given; second surgery 01/2015 to correct prior surgery  . FOOT SURGERY Left 5/16   Dr Beckey Downing at [place]  . HAMMER TOE SURGERY  10+ yrs   left foot  . LEEP  15+ yrs   normal paps since then  .  REFRACTIVE SURGERY  5 yrs ago   b/l    Current Outpatient Prescriptions  Medication Sig Dispense Refill  . ALPRAZolam (XANAX) 0.25 MG tablet Take 1 tablet (0.25 mg total) by mouth 2 (two) times daily as needed for sleep or anxiety. 10 tablet 1  . aspirin 81 MG tablet Take 81 mg by mouth daily.      . calcium carbonate 200 MG capsule Take 400 mg by mouth daily.      . cetirizine (ZYRTEC) 10 MG tablet Take 10 mg by mouth daily.      Marland Kitchen estradiol (ESTRACE) 0.1 MG/GM vaginal cream 1 gram vaginally twice weekly 42.5 g 6  . famciclovir (FAMVIR) 250 MG tablet TAKE 1 TABLET BY MOUTH TWICE A DAY 180 tablet 3  . fluticasone (FLONASE) 50 MCG/ACT nasal spray Place 1 spray into both nostrils daily. In spring, summer, fall 48 g 3  . multivitamin (THERAGRAN) per tablet Take 1 tablet by mouth daily.      . Omega-3 Fatty Acids (FISH OIL PO) Take by mouth.    . pantoprazole (PROTONIX) 40 MG tablet Take 1 tablet (40 mg total) by mouth daily. 90 tablet 3  . Probiotic Product (PROBIOTIC DAILY PO) Take by mouth daily.    Marland Kitchen venlafaxine XR (EFFEXOR XR) 75 MG 24 hr  capsule Take 1 capsule (75 mg total) by mouth daily with breakfast. 90 capsule 3   No current facility-administered medications for this visit.     Family History  Problem Relation Age of Onset  . Other Mother     CHF  . Diabetes Mother     Borderline  . Hypertension Mother   . Obesity Mother   . Anxiety disorder Mother   . Other Father     CHF  . Obesity Sister   . Diabetes Sister     type 2  . Hypertension Sister   . Anxiety disorder Sister   . Diverticulosis Sister   . Hyperlipidemia Sister   . Obesity Brother   . Anxiety disorder Brother   . Cancer Maternal Aunt     breast  . Cancer Paternal Aunt     Breast  . Obesity Paternal Grandmother   . Other Paternal Grandfather     Committed suicide during the [place] Depression  . Obesity Sister   . Anxiety disorder Sister   . Fibrocystic breast disease Sister   . Obesity Sister      ROS:  Pertinent items are noted in HPI.  Otherwise, a comprehensive ROS was negative.  Exam:   BP 130/70 (BP Location: Right Arm, Patient Position: Sitting, Cuff Size: Large)   Pulse 84   Resp 16   Ht 5' 1.5" (1.562 m)   Wt 191 lb (86.6 kg)   LMP 03/02/2011 (Approximate)   BMI 35.50 kg/m   Weight change: +4#   Height: 5' 1.5" (156.2 cm)  Ht Readings from Last 3 Encounters:  11/18/16 5' 1.5" (1.562 m)  01/21/16 5' 1.5" (1.562 m)  07/30/15 5' 1.5" (1.562 m)    General appearance: alert, cooperative and appears stated age Head: Normocephalic, without obvious abnormality, atraumatic Neck: no adenopathy, supple, symmetrical, trachea midline and thyroid normal to inspection and palpation Lungs: clear to auscultation bilaterally Breasts: normal appearance, no masses or tenderness Heart: regular rate and rhythm Abdomen: soft, non-tender; bowel sounds normal; no masses,  no organomegaly Extremities: extremities normal, atraumatic, no cyanosis or edema Skin: Skin color, texture, turgor normal. No rashes or lesions Lymph nodes: Cervical, supraclavicular, and axillary nodes normal. No abnormal inguinal nodes palpated Neurologic: Grossly normal   Pelvic: External genitalia:  no lesions              Urethra:  normal appearing urethra with no masses, tenderness or lesions              Bartholins and Skenes: normal                 Vagina: normal appearing vagina with normal color and discharge, no lesions              Cervix: no lesions              Pap taken: No. Bimanual Exam:  Uterus:  normal size, contour, position, consistency, mobility, non-tender              Adnexa: normal adnexa and no mass, fullness, tenderness               Rectovaginal: Confirms               Anus:  normal sphincter tone, no lesions  Chaperone was present for exam.  A:  Well Woman with normal exam PMP, no HRT H/O depression/anxiety  P:   Mammogram guidelines reviewed pap smear with neg HR HPV  11/16 Rx  for estrace vaginal cream not needed.  Pt will call when she needs RF. RF for famvir 250mg  bid #180/4RF Lab work and vaccines UTD with Dr. Randel Pigg return annually or prn

## 2017-01-03 ENCOUNTER — Encounter: Payer: Self-pay | Admitting: Podiatry

## 2017-01-03 ENCOUNTER — Ambulatory Visit (INDEPENDENT_AMBULATORY_CARE_PROVIDER_SITE_OTHER): Payer: Managed Care, Other (non HMO) | Admitting: Podiatry

## 2017-01-03 VITALS — BP 145/83 | HR 79 | Ht 61.5 in | Wt 185.0 lb

## 2017-01-03 DIAGNOSIS — M7752 Other enthesopathy of left foot: Secondary | ICD-10-CM | POA: Diagnosis not present

## 2017-01-03 DIAGNOSIS — R29898 Other symptoms and signs involving the musculoskeletal system: Secondary | ICD-10-CM | POA: Diagnosis not present

## 2017-01-03 DIAGNOSIS — M79672 Pain in left foot: Secondary | ICD-10-CM | POA: Diagnosis not present

## 2017-01-03 DIAGNOSIS — M659 Synovitis and tenosynovitis, unspecified: Secondary | ICD-10-CM

## 2017-01-03 MED ORDER — DICLOFENAC EPOLAMINE 1.3 % TD PTCH: 1.0000 | MEDICATED_PATCH | Freq: Two times a day (BID) | TRANSDERMAL | 1 refills | Status: DC

## 2017-01-03 NOTE — Patient Instructions (Signed)
Seen for pain in left foot following multiple surgery. Noted of extra bone in joint space with excess fibrous scar over incision site. Reviewed findings and available treatment options. Will try compression, NSAIA, and topical. Return in 3 weeks.

## 2017-01-03 NOTE — Progress Notes (Signed)
SUBJECTIVE: 61 y.o. year old female presents complaining of pain in left foot. Patient points dorsum over middle and lateral cuneiform just proximal from Lisfranc joint being the source of foot pain, which also has old scar from previous surgery. Patient relates history of multiple foot surgery at the same site: August 2015 removal of bone spur with fusion of mid tarsal bone. May 2016 repeat procedure of fusion. Nov 2017 removal plate and screw on left foot. 2nd toe and metatarsal surgery on left 20 years ago.  REVIEW OF SYSTEMS: Pertinent items noted in HPI and remainder of comprehensive ROS otherwise negative.  OBJECTIVE: DERMATOLOGIC EXAMINATION: No abnormal skin lesions.  VASCULAR EXAMINATION OF LOWER LIMBS: Pedal pulses: All pedal pulses are palpable with normal pulsation.  No acute soft tissue edema or erythema noted. Capillary Filling times within 3 seconds in all digits.  Temperature gradient from tibial crest to dorsum of foot is within normal bilateral.  NEUROLOGIC EXAMINATION OF THE LOWER LIMBS: All epicritic and tactile sensations grossly intact. Sharp and Dull discriminatory sensations at the plantar ball of hallux is intact bilateral.   MUSCULOSKELETAL EXAMINATION: Positive for raised firm soft tissue over old incision line near middle and lateral Cuneiform bone, symptomatic with light pressure. Rigid 2nd MPJ and IPJ left foot post surgical. High arched foot with excess sagittal plane motion of the first ray bilateral.  Patient brought X-ray from Dr. Lindley Magnus office. AP View:  Post surgical screw type hardware at the head part of the 2nd Metatarsal, partial body of screw x 2, near the base of 2nd metatarsal and at the middle of Middle Cuneiform.  No discernable joint space between Naviculo-cuneiform joint, and Lateral Cuneiform and Navicular joint space, and obliterated Lisfranc joint space at the base of the 2nd through 5th Metatarsal base.  Positive of accessory  Navicular bone. Lateral view:  Supinated foot with well developed plantar calcaneal spur. Positive of loose ossicle, ~ 0.5 Cm at about the dorsal edge of the first Cuneiform and Navicular joint area. Small fragment of loose bone at dorsal edge of the Talo-Navicular joint.  Screw in 2nd Metatarsal head area is placed dorsal proximal to plantar distal within the Metatarsal head. Partial body of 2 screws at about 1 cm distal from the Lisfranc joint of the 2nd metatarsal base and at the lesser Cuneiform bones directing dorsal to plantar perpendicular the the direction of the metatarsal shaft. Clear and congruous Ankle joint, Subtalar joint, Cyma line, first MCJ, MPJ, and forefoot joints noted.   Oblique view: Clear joint space of TNJ, CCJ, NCJ, 4th and 5th metatarsal base joint, and between the 3rd Cuneiform and Cuboid bone.  3rd Metatarsla base with the lateral cuneiform joint is irregular.  Some joint space lucency noted between the lateral and middle Cuneiform joint, and 3rd metatarsal and lateral cuneiform joint space.  Conclusion: Possible 2nd Metatarsal and the first Cuneiform joint fusion procedure may have been attempted.  ASSESSMENT: Post surgical fibrosis over incision site over middle and lateral cuneiform area left foot. Arthropathy mid foot left. Free ossicle in joint space, possible at Naviculo-cuneiform joint left. Pain at surgical site with increased activity.  PLAN: Reviewed clinical findings and available treatment options, injection, compression, NSAIA, possible surgical. Flector patch and NSAIA cream prescribed. Compression bandage dispensed with instruction. Will monitor in 3 weeks to see how it progress. If no improvement may do MRI to locate position of free ossicle and may try to remove bone fragment.

## 2017-01-26 ENCOUNTER — Encounter: Payer: Self-pay | Admitting: Podiatry

## 2017-01-26 ENCOUNTER — Ambulatory Visit (INDEPENDENT_AMBULATORY_CARE_PROVIDER_SITE_OTHER): Payer: Managed Care, Other (non HMO) | Admitting: Podiatry

## 2017-01-26 DIAGNOSIS — M659 Synovitis and tenosynovitis, unspecified: Secondary | ICD-10-CM | POA: Diagnosis not present

## 2017-01-26 DIAGNOSIS — M7752 Other enthesopathy of left foot: Secondary | ICD-10-CM | POA: Diagnosis not present

## 2017-01-26 DIAGNOSIS — R29898 Other symptoms and signs involving the musculoskeletal system: Secondary | ICD-10-CM

## 2017-01-26 NOTE — Patient Instructions (Signed)
Follow up on left foot lesion. Doing well with compounding cream and compression bandage. Continue with current level of care. Return as needed.

## 2017-01-26 NOTE — Progress Notes (Signed)
SUBJECTIVE: 61 y.o. year old female presents for follow up on left foot pain. Been using compounding cream with compression bandage over the left foot. Stated that she has very little discomfort now since the treatment. Able to walk without discomfort.  HPI: Left foot pain over dorsum over middle and lateral cuneiform just proximal from Lisfranc joint, which also has old scar from previous surgery. Patient relates history of multiple foot surgery at the same site: August 2015 removal of bone spur with fusion of mid tarsal bone. May 2016 repeat procedure of fusion. Nov 2017 removal plate and screw on left foot. 2nd toe and metatarsal surgery on left 20 years ago.  REVIEW OF SYSTEMS: Pertinent items noted in HPI and remainder of comprehensive ROS otherwise negative.  OBJECTIVE: DERMATOLOGIC EXAMINATION: No abnormal skin lesions.  VASCULAR EXAMINATION OF LOWER LIMBS: Pedal pulses: All pedal pulses are palpable with normal pulsation.  No acute soft tissue edema or erythema noted. Capillary Filling times within 3 seconds in all digits.  Temperature gradient from tibial crest to dorsum of foot is within normal bilateral.  NEUROLOGIC EXAMINATION OF THE LOWER LIMBS: All epicritic and tactile sensations grossly intact. Sharp and Dull discriminatory sensations at the plantar ball of hallux is intact bilateral.   MUSCULAR: Noted of decreased mass size over old incision line near middle and lateral Cuneiform bone. Has much less pain pressure. Rigid 2nd MPJ and IPJ left foot post surgical. High arched foot with excess sagittal plane motion of the first ray bilateral.  ASSESSMENT: Improved post surgical fibrosis over incision site over middle and lateral cuneiform area left foot. Arthropathy mid foot left. Free ossicle in joint space, possible at Naviculo-cuneiform joint left. Improved pain at surgical site left foot.  PLAN: Reviewed clinical findings and available treatment  options, injection, compression, NSAIA, possible surgical. Continue with compounding cream and compression band. Return as needed.

## 2017-02-21 ENCOUNTER — Encounter: Payer: Self-pay | Admitting: Podiatry

## 2017-02-21 ENCOUNTER — Ambulatory Visit (INDEPENDENT_AMBULATORY_CARE_PROVIDER_SITE_OTHER): Payer: Managed Care, Other (non HMO) | Admitting: Podiatry

## 2017-02-21 DIAGNOSIS — L299 Pruritus, unspecified: Secondary | ICD-10-CM

## 2017-02-21 DIAGNOSIS — R21 Rash and other nonspecific skin eruption: Secondary | ICD-10-CM | POA: Diagnosis not present

## 2017-02-21 MED ORDER — BETAMETHASONE DIPROPIONATE 0.05 % EX CREA: TOPICAL_CREAM | Freq: Two times a day (BID) | CUTANEOUS | 0 refills | Status: DC

## 2017-02-21 NOTE — Progress Notes (Signed)
SUBJECTIVE: 61 y.o.year old femalepresents with rashes on left foot dorsum and ankle. Been on sun after using Compounding cream and sun block over the weekend.  Been using compounding cream with compression bandage over the left foot. Stated that she has very little discomfort now since the treatment. Able to walk without discomfort.  HPI: Left foot pain over dorsum over middle and lateral cuneiform just proximal from Lisfranc joint, which also has old scar from previous surgery. Patient relates history of multiple foot surgery at the same site: August 2015 removal of bone spur with fusion of mid tarsal bone. May 2016 repeat procedure of fusion. Nov 2017 removal plate and screw on left foot. 2nd toe and metatarsal surgery on left 20 years ago.  REVIEW OF SYSTEMS: Pertinent items noted in HPI and remainder of comprehensive ROS otherwise negative.  OBJECTIVE: DERMATOLOGIC EXAMINATION: Red bumps with erythematous skin in patches over the dorsum and medial ankle left foot corresponding to the area of treatment with Compounding cream. VASCULAR EXAMINATION OF LOWER LIMBS: Pedal pulses: All pedal pulses are palpable with normal pulsation.  No acute soft tissue edema or erythema noted. Capillary Filling times within 3 seconds in all digits.  Temperature gradient from tibial crest to dorsum of foot is within normal bilateral.  NEUROLOGIC EXAMINATION OF THE LOWER LIMBS: All epicritic and tactile sensations grossly intact. Sharp and Dull discriminatory sensations at the plantar ball of hallux is intact bilateral.   MUSCULAR: Noted of decreased mass size over old incision line near middle and lateral Cuneiform bone. Has much less pain pressure. Rigid 2nd MPJ and IPJ left foot post surgical. High arched foot with excess sagittal plane motion of the first ray bilateral.  ASSESSMENT: Sun exposure and hypersensitivity at the area of compounding cream use. Improved post surgical  fibrosis over incision site over middle and lateral cuneiform area left foot. Arthropathy mid foot left. Free ossicle in joint space, possible at Naviculo-cuneiform joint left. Improved pain at surgical site left foot.  PLAN: Reviewed clinical findings and available treatment options, injection, compression, NSAIA, possible surgical. Hold off compounding cream but continue with compression bandage as tolerated. Betamethasone cream prescribed. Return as needed.

## 2017-02-21 NOTE — Patient Instructions (Signed)
Seen for acute rash over left foot and ankle after using Compounding cream and Sun block. Rash is dry without blisters. Will use Cortisone cream. Hold off compounding cream till the rashes resolved.

## 2017-03-09 ENCOUNTER — Other Ambulatory Visit: Payer: Self-pay | Admitting: Family Medicine

## 2017-03-10 ENCOUNTER — Other Ambulatory Visit: Payer: Self-pay | Admitting: Family Medicine

## 2017-04-03 ENCOUNTER — Other Ambulatory Visit: Payer: Self-pay | Admitting: Family Medicine

## 2017-04-07 ENCOUNTER — Other Ambulatory Visit: Payer: Self-pay | Admitting: Family Medicine

## 2017-04-21 ENCOUNTER — Other Ambulatory Visit: Payer: Self-pay | Admitting: Family Medicine

## 2017-04-26 ENCOUNTER — Telehealth: Payer: Self-pay | Admitting: Family Medicine

## 2017-04-26 NOTE — Telephone Encounter (Signed)
Relation to ZE:SPQZ Call back number:[phone] Pharmacy: [id number] - [place], [place] [phone] (Phone) [phone] (Fax)    Reason for call:  Patient scheduled her physical for October 4th requesting refill to hold her over until appointment: venlafaxine XR (EFFEXOR-XR) 75 MG 24 hr capsule and fluticasone (FLONASE) 50 MCG/ACT nasal spray

## 2017-04-28 ENCOUNTER — Other Ambulatory Visit: Payer: Self-pay | Admitting: Family Medicine

## 2017-04-28 MED ORDER — FLUTICASONE PROPIONATE 50 MCG/ACT NA SUSP: 1.0000 | Freq: Every day | NASAL | 3 refills | Status: DC

## 2017-04-28 NOTE — Telephone Encounter (Signed)
Sent in prescriptions Patient notified refills done

## 2017-05-23 ENCOUNTER — Other Ambulatory Visit: Payer: Self-pay

## 2017-05-23 MED ORDER — VENLAFAXINE HCL ER 75 MG PO CP24: ORAL_CAPSULE | ORAL | 0 refills | Status: DC

## 2017-06-05 ENCOUNTER — Telehealth: Payer: Self-pay | Admitting: Family Medicine

## 2017-06-05 NOTE — Telephone Encounter (Signed)
°  Relation to MI:TVIF  Call back number:[phone] Pharmacy: [id number] - [place], [place] [phone] (Phone) [phone] (Fax)     Reason for call:  Patient requesting a refill venlafaxine XR (EFFEXOR-XR) 75 MG 24 hr capsule, patient states she will run out by her 06/15/17 appointment,please advise

## 2017-06-05 NOTE — Telephone Encounter (Signed)
Patient needs to request refill on 06/22/17.  Last refill 05/23/17. If request now it will be denied.   PC

## 2017-06-07 NOTE — Telephone Encounter (Signed)
Pt states only one dose for tomorrow left of generic Effexor XR. Checking status refill request.

## 2017-06-08 MED ORDER — VENLAFAXINE HCL ER 75 MG PO CP24: ORAL_CAPSULE | ORAL | 0 refills | Status: DC

## 2017-06-08 NOTE — Telephone Encounter (Signed)
Dr. Charlett Blake agreed to rx her meds for 90 days if patient misses appointment she will be dismissed.   PC

## 2017-06-08 NOTE — Addendum Note (Signed)
Addended by: Magdalene Molly A on: 06/08/2017 05:31 PM   Modules accepted: Orders

## 2017-06-08 NOTE — Telephone Encounter (Signed)
Pt called in to follow up again on refill request. Pt says that she is now completely out. Pt says that her insurance will only cover a  90 days supply. Pt would like to have refill as soon as possible.

## 2017-06-15 ENCOUNTER — Encounter: Payer: Self-pay | Admitting: Family Medicine

## 2017-06-15 ENCOUNTER — Ambulatory Visit (INDEPENDENT_AMBULATORY_CARE_PROVIDER_SITE_OTHER): Payer: Managed Care, Other (non HMO) | Admitting: Family Medicine

## 2017-06-15 VITALS — BP 126/82 | HR 94 | Temp 97.8°F | Resp 18 | Ht 61.5 in | Wt 184.4 lb

## 2017-06-15 DIAGNOSIS — E663 Overweight: Secondary | ICD-10-CM

## 2017-06-15 DIAGNOSIS — Z23 Encounter for immunization: Secondary | ICD-10-CM | POA: Diagnosis not present

## 2017-06-15 DIAGNOSIS — Z0000 Encounter for general adult medical examination without abnormal findings: Secondary | ICD-10-CM | POA: Diagnosis not present

## 2017-06-15 DIAGNOSIS — R739 Hyperglycemia, unspecified: Secondary | ICD-10-CM

## 2017-06-15 DIAGNOSIS — M722 Plantar fascial fibromatosis: Secondary | ICD-10-CM

## 2017-06-15 DIAGNOSIS — J309 Allergic rhinitis, unspecified: Secondary | ICD-10-CM | POA: Diagnosis not present

## 2017-06-15 DIAGNOSIS — E782 Mixed hyperlipidemia: Secondary | ICD-10-CM | POA: Diagnosis not present

## 2017-06-15 LAB — LIPID PANEL
CHOL/HDL RATIO: 2
Cholesterol: 244 mg/dL — ABNORMAL HIGH (ref 0–200)
HDL: 99.4 mg/dL (ref >39.00)
LDL Cholesterol: 129 mg/dL — ABNORMAL HIGH (ref 0–99)
NONHDL: 144.91
Triglycerides: 81 mg/dL (ref 0.0–149.0)
VLDL: 16.2 mg/dL (ref 0.0–40.0)

## 2017-06-15 LAB — COMPREHENSIVE METABOLIC PANEL
ALK PHOS: 100 U/L (ref 39–117)
ALT: 15 U/L (ref 0–35)
AST: 16 U/L (ref 0–37)
Albumin: 4.6 g/dL (ref 3.5–5.2)
BUN: 18 mg/dL (ref 6–23)
CO2: 25 meq/L (ref 19–32)
Calcium: 10.1 mg/dL (ref 8.4–10.5)
Chloride: 100 mEq/L (ref 96–112)
Creatinine, Ser: 0.73 mg/dL (ref 0.40–1.20)
GFR: 86.13 mL/min (ref >60.00)
GLUCOSE: 99 mg/dL (ref 70–99)
POTASSIUM: 4 meq/L (ref 3.5–5.1)
Sodium: 137 mEq/L (ref 135–145)
TOTAL PROTEIN: 7.8 g/dL (ref 6.0–8.3)
Total Bilirubin: 0.5 mg/dL (ref 0.2–1.2)

## 2017-06-15 LAB — CBC
HCT: 41.1 % (ref 36.0–46.0)
HEMOGLOBIN: 13.6 g/dL (ref 12.0–15.0)
MCHC: 33.1 g/dL (ref 30.0–36.0)
MCV: 90.6 fl (ref 78.0–100.0)
Platelets: 257 10*3/uL (ref 150.0–400.0)
RBC: 4.53 Mil/uL (ref 3.87–5.11)
RDW: 13.9 % (ref 11.5–15.5)
WBC: 5.9 10*3/uL (ref 4.0–10.5)

## 2017-06-15 LAB — TSH: TSH: 1.54 u[IU]/mL (ref 0.35–4.50)

## 2017-06-15 LAB — HEMOGLOBIN A1C: HEMOGLOBIN A1C: 5.3 % (ref 4.6–6.5)

## 2017-06-15 NOTE — Assessment & Plan Note (Signed)
Flonase and Allertec, nasal saline

## 2017-06-15 NOTE — Assessment & Plan Note (Signed)
Encouraged DASH diet, decrease po intake and increase exercise as tolerated. Needs 7-8 hours of sleep nightly. Avoid trans fats, eat small, frequent meals every 4-5 hours with lean proteins, complex carbs and healthy fats. Minimize simple carbs 

## 2017-06-15 NOTE — Assessment & Plan Note (Signed)
Encouraged heart healthy diet, increase exercise, avoid trans fats, consider a krill oil cap daily 

## 2017-06-15 NOTE — Assessment & Plan Note (Signed)
hgba1c acceptable, minimize simple carbs. Increase exercise as tolerated. Continue current meds 

## 2017-06-15 NOTE — Progress Notes (Signed)
Subjective:  I acted as a Education administrator for Dr. Charlett Blake. [place], [place]  Patient ID: Breanna Bridges, female    DOB: September 03, 1956, 61 y.o.   MRN: [id number]  No chief complaint on file.   HPI  Patient is in today for an annual exam and follow up on chronic medical concerns. She is trying to maintain a heart healthy diet and stay active but Is frustrated with her weight and ongoing left foot pain despite surgery back in November. No recent febrile illness or hospitalizations. She is doing well with ADLs at home. Denies CP/palp/SOB/HA/congestion/fevers/GI or GU c/o. Taking meds as prescribed. No polyuria or polydipsia.   Patient Care Team: Mosie Lukes, MD as PCP - General (Family Medicine)   Past Medical History:  Diagnosis Date  . Abnormal Pap smear    CIN I-LEEP  . Depression    h/o depression/anxiety  . Depression with anxiety 01/18/2015  . GERD (gastroesophageal reflux disease)   . HSV infection   . Hyperglycemia 01/31/2016  . Other and unspecified hyperlipidemia 05/20/2013  . Stress fracture 2008   left foot  . Trigger finger of left hand    ring finger    Past Surgical History:  Procedure Laterality Date  . ANKLE SURGERY Left 07/2016  . FOOT SURGERY Left 04/15/14   left foot-bone spur, Dr. Sharol Given; second surgery 01/2015 to correct prior surgery  . FOOT SURGERY Left 5/16   Dr Beckey Downing at [facility]  . HAMMER TOE SURGERY  10+ yrs   left foot  . LEEP  15+ yrs   normal paps since then  . REFRACTIVE SURGERY  5 yrs ago   b/l    Family History  Problem Relation Age of Onset  . Other Mother        CHF  . Diabetes Mother        Borderline  . Hypertension Mother   . Obesity Mother   . Anxiety disorder Mother   . Other Father        CHF  . Obesity Sister   . Diabetes Sister        type 2  . Hypertension Sister   . Anxiety disorder Sister   . Diverticulosis Sister   . Hyperlipidemia Sister   . Obesity Brother   . Anxiety disorder Brother   . Cancer Maternal Aunt        breast  .  Cancer Paternal Aunt        Breast  . Obesity Paternal Grandmother   . Other Paternal Grandfather        Committed suicide during the [place] Depression  . Obesity Sister   . Anxiety disorder Sister   . Fibrocystic breast disease Sister   . Thyroid disease Sister   . Obesity Sister     Social History   Social History  . Marital status: Married    Spouse name: N/A  . Number of children: N/A  . Years of education: N/A   Occupational History  . Not on file.   Social History Main Topics  . Smoking status: Former Smoker    Packs/day: 0.25    Quit date: 09/13/1983  . Smokeless tobacco: Never Used  . Alcohol use 6.0 oz/week    10 Glasses of wine per week  . Drug use: No  . Sexual activity: Yes    Partners: Male    Birth control/ protection: None, Post-menopausal     Comment: no dietary restrictions, lives with husband, 4 dogs  Other Topics Concern  . Not on file   Social History Narrative   Lives with Obie Dredge, husband, no major dietary restrictions, works in [employer], [employer]    Outpatient Medications Prior to Visit  Medication Sig Dispense Refill  . aspirin 81 MG tablet Take 81 mg by mouth daily.      . betamethasone dipropionate (DIPROLENE) 0.05 % cream Apply topically 2 (two) times daily. 30 g 0  . calcium carbonate 200 MG capsule Take 400 mg by mouth daily.      . cetirizine (ZYRTEC) 10 MG tablet Take 10 mg by mouth daily.      . famciclovir (FAMVIR) 250 MG tablet Take 1 tablet (250 mg total) by mouth 2 (two) times daily. 180 tablet 4  . fluticasone (FLONASE) 50 MCG/ACT nasal spray Place 1 spray into both nostrils daily. In spring, summer, fall 48 g 3  . multivitamin (THERAGRAN) per tablet Take 1 tablet by mouth daily.      . Omega-3 Fatty Acids (FISH OIL PO) Take by mouth.    . pantoprazole (PROTONIX) 40 MG tablet TAKE 1 TABLET (40 MG TOTAL) BY MOUTH DAILY. 90 tablet 3  . Probiotic Product (PROBIOTIC DAILY PO) Take by mouth daily.    Marland Kitchen venlafaxine XR  (EFFEXOR-XR) 75 MG 24 hr capsule TAKE ONE CAPSULE BY MOUTH EVERY DAY WITH BREAKFAST 90 capsule 0  . ALPRAZolam (XANAX) 0.25 MG tablet Take 1 tablet (0.25 mg total) by mouth 2 (two) times daily as needed for sleep or anxiety. 10 tablet 1  . diclofenac (FLECTOR) 1.3 % PTCH Place 1 patch onto the skin 2 (two) times daily. 30 patch 1  . estradiol (ESTRACE) 0.1 MG/GM vaginal cream 1 gram vaginally twice weekly 42.5 g 6   No facility-administered medications prior to visit.     Allergies  Allergen Reactions  . Codeine     REACTION: nausea    Review of Systems  Constitutional: Negative for fever and malaise/fatigue.  HENT: Negative for congestion.   Eyes: Negative for blurred vision.  Respiratory: Negative for cough and shortness of breath.   Cardiovascular: Negative for chest pain, palpitations and leg swelling.  Gastrointestinal: Negative for vomiting.  Musculoskeletal: Positive for joint pain. Negative for back pain.  Skin: Negative for rash.  Neurological: Negative for loss of consciousness and headaches.       Objective:    Physical Exam  Constitutional: She is oriented to person, place, and time. She appears well-developed and well-nourished. No distress.  HENT:  Head: Normocephalic and atraumatic.  Eyes: Conjunctivae are normal.  Neck: Normal range of motion. No thyromegaly present.  Cardiovascular: Normal rate and regular rhythm.   Pulmonary/Chest: Effort normal and breath sounds normal. She has no wheezes.  Abdominal: Soft. Bowel sounds are normal. There is no tenderness.  Musculoskeletal: Normal range of motion. She exhibits no edema or deformity.  Neurological: She is alert and oriented to person, place, and time.  Skin: Skin is warm and dry. She is not diaphoretic.  Psychiatric: She has a normal mood and affect.    BP 126/82 (BP Location: Left Arm, Patient Position: Sitting, Cuff Size: Normal)   Pulse 94   Temp 97.8 F (36.6 C) (Oral)   Resp 18   Ht 5' 1.5"  (1.562 m)   Wt 184 lb 6.4 oz (83.6 kg)   LMP 03/02/2011 (Approximate)   SpO2 98%   BMI 34.28 kg/m  Wt Readings from Last 3 Encounters:  06/15/17 184 lb 6.4 oz (  83.6 kg)  01/03/17 185 lb (83.9 kg)  11/18/16 191 lb (86.6 kg)   BP Readings from Last 3 Encounters:  06/15/17 126/82  01/03/17 (!) 145/83  11/18/16 130/70     Immunization History  Administered Date(s) Administered  . Influenza Whole 05/13/2010, 07/08/2013  . Influenza, Seasonal, Injecte, Preservative Fre 07/02/2015  . Influenza,inj,Quad PF,6+ Mos 06/15/2017  . Td 09/13/2007  . Tdap 12/13/2011  . Zoster 09/25/2012    Health Maintenance  Topic Date Due  . HIV Screening  05/24/1971  . PAP SMEAR  07/29/2018  . MAMMOGRAM  09/01/2018  . COLONOSCOPY  12/12/2019  . TETANUS/TDAP  12/12/2021  . INFLUENZA VACCINE  Completed  . Hepatitis C Screening  Completed    Lab Results  Component Value Date   WBC 5.9 06/15/2017   HGB 13.6 06/15/2017   HCT 41.1 06/15/2017   PLT 257.0 06/15/2017   GLUCOSE 99 06/15/2017   CHOL 244 (H) 06/15/2017   TRIG 81.0 06/15/2017   HDL 99.40 06/15/2017   LDLCALC 129 (H) 06/15/2017   ALT 15 06/15/2017   AST 16 06/15/2017   NA 137 06/15/2017   K 4.0 06/15/2017   CL 100 06/15/2017   CREATININE 0.73 06/15/2017   BUN 18 06/15/2017   CO2 25 06/15/2017   TSH 1.54 06/15/2017   HGBA1C 5.3 06/15/2017    Lab Results  Component Value Date   TSH 1.54 06/15/2017   Lab Results  Component Value Date   WBC 5.9 06/15/2017   HGB 13.6 06/15/2017   HCT 41.1 06/15/2017   MCV 90.6 06/15/2017   PLT 257.0 06/15/2017   Lab Results  Component Value Date   NA 137 06/15/2017   K 4.0 06/15/2017   CO2 25 06/15/2017   GLUCOSE 99 06/15/2017   BUN 18 06/15/2017   CREATININE 0.73 06/15/2017   BILITOT 0.5 06/15/2017   ALKPHOS 100 06/15/2017   AST 16 06/15/2017   ALT 15 06/15/2017   PROT 7.8 06/15/2017   ALBUMIN 4.6 06/15/2017   CALCIUM 10.1 06/15/2017   GFR 86.13 06/15/2017   Lab Results    Component Value Date   CHOL 244 (H) 06/15/2017   Lab Results  Component Value Date   HDL 99.40 06/15/2017   Lab Results  Component Value Date   LDLCALC 129 (H) 06/15/2017   Lab Results  Component Value Date   TRIG 81.0 06/15/2017   Lab Results  Component Value Date   CHOLHDL 2 06/15/2017   Lab Results  Component Value Date   HGBA1C 5.3 06/15/2017         Assessment & Plan:   Problem List Items Addressed This Visit    Overweight    Encouraged DASH diet, decrease po intake and increase exercise as tolerated. Needs 7-8 hours of sleep nightly. Avoid trans fats, eat small, frequent meals every 4-5 hours with lean proteins, complex carbs and healthy fats. Minimize simple carbs.      Allergic rhinitis    Flonase and Allertec, nasal saline      PLANTAR FASCIITIS, LEFT    Still struggles with foot pain despite surgery and a topical cream which has been somewhat helpful. It is a compound cream with Diclofenac, Gabapentin, Cyclobenzaprine, Lidocaine, anantatin, Centafaxine and Ibuprofen cream she will follow up with her surgeon.       Preventative health care    Patient encouraged to maintain heart healthy diet, regular exercise, adequate sleep. Consider daily probiotics. Take medications as prescribed. Follows with Dr Sabra Heck of GYN  for her paps and MGMs. Last colonoscopy was 2011 and is due to be repeated at 2021      Hyperlipidemia, mixed    Encouraged heart healthy diet, increase exercise, avoid trans fats, consider a krill oil cap daily      Relevant Orders   Lipid panel (Completed)   Hyperglycemia    hgba1c acceptable, minimize simple carbs. Increase exercise as tolerated. Continue current meds      Relevant Orders   Hemoglobin A1c (Completed)   CBC (Completed)   Comprehensive metabolic panel (Completed)   TSH (Completed)    Other Visit Diagnoses    Needs flu shot    -  Primary   Relevant Orders   Flu Vaccine QUAD 6+ mos PF IM (Fluarix Quad PF) (Completed)       I have discontinued Ms. Loa's ALPRAZolam, estradiol, and diclofenac. I am also having her maintain her aspirin, calcium carbonate, multivitamin, cetirizine, Omega-3 Fatty Acids (FISH OIL PO), Probiotic Product (PROBIOTIC DAILY PO), famciclovir, betamethasone dipropionate, pantoprazole, fluticasone, and venlafaxine XR.  No orders of the defined types were placed in this encounter.   CMA served as Education administrator during this visit. History, Physical and Plan performed by medical provider. Documentation and orders reviewed and attested to.  Penni Homans, MD

## 2017-06-15 NOTE — Patient Instructions (Signed)
Shingrix is the new shingles shot, shots over 6 months, confirm payment with insurance then can call herer or get at [facility] 40-64 Years, Female Preventive care refers to lifestyle choices and visits with your health care provider that can promote health and wellness. What does preventive care include?  A yearly physical exam. This is also called an annual well check.  Dental exams once or twice a year.  Routine eye exams. Ask your health care provider how often you should have your eyes checked.  Personal lifestyle choices, including: ? Daily care of your teeth and gums. ? Regular physical activity. ? Eating a healthy diet. ? Avoiding tobacco and drug use. ? Limiting alcohol use. ? Practicing safe sex. ? Taking low-dose aspirin daily starting at age 57. ? Taking vitamin and mineral supplements as recommended by your health care provider. What happens during an annual well check? The services and screenings done by your health care provider during your annual well check will depend on your age, overall health, lifestyle risk factors, and family history of disease. Counseling Your health care provider may ask you questions about your:  Alcohol use.  Tobacco use.  Drug use.  Emotional well-being.  Home and relationship well-being.  Sexual activity.  Eating habits.  Work and work Statistician.  Method of birth control.  Menstrual cycle.  Pregnancy history.  Screening You may have the following tests or measurements:  Height, weight, and BMI.  Blood pressure.  Lipid and cholesterol levels. These may be checked every 5 years, or more frequently if you are over 47 years old.  Skin check.  Lung cancer screening. You may have this screening every year starting at age 66 if you have a 30-pack-year history of smoking and currently smoke or have quit within the past 15 years.  Fecal occult blood test (FOBT) of the stool. You may have this test every  year starting at age 68.  Flexible sigmoidoscopy or colonoscopy. You may have a sigmoidoscopy every 5 years or a colonoscopy every 10 years starting at age 52.  Hepatitis C blood test.  Hepatitis B blood test.  Sexually transmitted disease (STD) testing.  Diabetes screening. This is done by checking your blood sugar (glucose) after you have not eaten for a while (fasting). You may have this done every 1-3 years.  Mammogram. This may be done every 1-2 years. Talk to your health care provider about when you should start having regular mammograms. This may depend on whether you have a family history of breast cancer.  BRCA-related cancer screening. This may be done if you have a family history of breast, ovarian, tubal, or peritoneal cancers.  Pelvic exam and Pap test. This may be done every 3 years starting at age 61. Starting at age 65, this may be done every 5 years if you have a Pap test in combination with an HPV test.  Bone density scan. This is done to screen for osteoporosis. You may have this scan if you are at high risk for osteoporosis.  Discuss your test results, treatment options, and if necessary, the need for more tests with your health care provider. Vaccines Your health care provider may recommend certain vaccines, such as:  Influenza vaccine. This is recommended every year.  Tetanus, diphtheria, and acellular pertussis (Tdap, Td) vaccine. You may need a Td booster every 10 years.  Varicella vaccine. You may need this if you have not been vaccinated.  Zoster vaccine. You may need this after age  15.  Measles, mumps, and rubella (MMR) vaccine. You may need at least one dose of MMR if you were born in 1957 or later. You may also need a second dose.  Pneumococcal 13-valent conjugate (PCV13) vaccine. You may need this if you have certain conditions and were not previously vaccinated.  Pneumococcal polysaccharide (PPSV23) vaccine. You may need one or two doses if you smoke  cigarettes or if you have certain conditions.  Meningococcal vaccine. You may need this if you have certain conditions.  Hepatitis A vaccine. You may need this if you have certain conditions or if you travel or work in places where you may be exposed to hepatitis A.  Hepatitis B vaccine. You may need this if you have certain conditions or if you travel or work in places where you may be exposed to hepatitis B.  Haemophilus influenzae type b (Hib) vaccine. You may need this if you have certain conditions.  Talk to your health care provider about which screenings and vaccines you need and how often you need them. This information is not intended to replace advice given to you by your health care provider. Make sure you discuss any questions you have with your health care provider. Document Released: 09/25/2015 Document Revised: 05/18/2016 Document Reviewed: 06/30/2015 Elsevier Interactive Patient Education  2017 [employer].

## 2017-06-18 NOTE — Assessment & Plan Note (Addendum)
Patient encouraged to maintain heart healthy diet, regular exercise, adequate sleep. Consider daily probiotics. Take medications as prescribed. Follows with Dr Sabra Heck of GYN for her paps and MGMs. Last colonoscopy was 2011 and is due to be repeated at 2021

## 2017-06-18 NOTE — Assessment & Plan Note (Signed)
Still struggles with foot pain despite surgery and a topical cream which has been somewhat helpful. It is a compound cream with Diclofenac, Gabapentin, Cyclobenzaprine, Lidocaine, anantatin, Centafaxine and Ibuprofen cream she will follow up with her surgeon.

## 2017-08-10 ENCOUNTER — Ambulatory Visit (INDEPENDENT_AMBULATORY_CARE_PROVIDER_SITE_OTHER): Payer: Managed Care, Other (non HMO)

## 2017-08-10 ENCOUNTER — Encounter (INDEPENDENT_AMBULATORY_CARE_PROVIDER_SITE_OTHER): Payer: Self-pay | Admitting: Surgery

## 2017-08-10 ENCOUNTER — Ambulatory Visit (INDEPENDENT_AMBULATORY_CARE_PROVIDER_SITE_OTHER): Payer: Managed Care, Other (non HMO) | Admitting: Surgery

## 2017-08-10 DIAGNOSIS — M7061 Trochanteric bursitis, right hip: Secondary | ICD-10-CM

## 2017-08-10 DIAGNOSIS — M5441 Lumbago with sciatica, right side: Secondary | ICD-10-CM

## 2017-08-10 MED ORDER — TRAMADOL HCL 50 MG PO TABS: 50.0000 mg | ORAL_TABLET | Freq: Three times a day (TID) | ORAL | 0 refills | Status: DC | PRN

## 2017-08-10 MED ORDER — METHOCARBAMOL 500 MG PO TABS: 500.0000 mg | ORAL_TABLET | Freq: Three times a day (TID) | ORAL | 0 refills | Status: DC | PRN

## 2017-08-10 MED ORDER — METHYLPREDNISOLONE 4 MG PO TABS: ORAL_TABLET | ORAL | 0 refills | Status: DC

## 2017-08-10 NOTE — Progress Notes (Signed)
Office Visit Note   Patient: Breanna Bridges           Date of Birth: 26-Aug-1956           MRN: [id number] Visit Date: 08/10/2017              Requested by: Mosie Lukes, MD [address] [place], [place] [address] PCP: Mosie Lukes, MD   Assessment & Plan: Visit Diagnoses:  1. Acute bilateral low back pain with right-sided sciatica   2. Acute right-sided low back pain with right-sided sciatica   3. Trochanteric bursitis, right hip     Plan: We'll try conservative treatment with Medrol Dosepak 6 day taper to be taken as directed, Robaxin for spasms and tramadol. Patient will follow up in 3 weeks for recheck but advised her to call me in 2 weeks to let me know how she is feeling. If she continues to be symptomatic I will plan to schedule lumbar MRI to rule out HNP/stenosis. Advised her to avoid any heavy lifting, bending, twisting. Follow-Up Instructions: Return in about 3 weeks (around 08/31/2017).   Orders:  Orders Placed This Encounter  Procedures  . XR Lumbar Spine 2-3 Views   Meds ordered this encounter  Medications  . traMADol (ULTRAM) 50 MG tablet    Sig: Take 1 tablet (50 mg total) by mouth every 8 (eight) hours as needed.    Dispense:  30 tablet    Refill:  0  . methocarbamol (ROBAXIN) 500 MG tablet    Sig: Take 1 tablet (500 mg total) by mouth every 8 (eight) hours as needed for muscle spasms.    Dispense:  40 tablet    Refill:  0  . methylPREDNISolone (MEDROL) 4 MG tablet    Sig: 6 day taper to be taken as directed    Dispense:  21 tablet    Refill:  0      Procedures: No procedures performed   Clinical Data: No additional findings.   Subjective: No chief complaint on file.   HPI 61 year old white female new patient to clinic presents with complaints of low back pain, right buttock pain and right leg pain. Patient states that she's had off-and-on issues with her low back for several years but current symptoms have been worsening  over the last several weeks. No injury. Pain when she is ambulating and laying on her right side. Denies lower from a numbness and tingling. No pain in the left leg. Her husband is currently recovering from lumbar fusion surgery and she thinks that her increase activity trying to help more around the house may have aggravated her symptoms. No complaints of bowel or bladder incontinence. Review of Systems No current cardiac pulmonary GI GU issues  Objective: Vital Signs: LMP 03/02/2011 (Approximate)   Physical Exam  Constitutional: She is oriented to person, place, and time. She appears well-developed. No distress.  HENT:  Head: Normocephalic and atraumatic.  Eyes: EOM are normal. Pupils are equal, round, and reactive to light.  Neck: Normal range of motion.  Pulmonary/Chest: No respiratory distress.  Musculoskeletal:  Gait is somewhat antalgic. Positive right sided lumbar paraspinal tenderness. Positive right sciatic notch tenderness. Moderate to markedly tender over the right hip greater trochanter bursa. Negative on the left side. Negative logroll. Negative straight leg raise. Bilateral calves are nontender. Neurovascularly intact.  Neurological: She is alert and oriented to person, place, and time.  Skin: Skin is warm and dry.  Psychiatric: She  has a normal mood and affect.    Ortho Exam  Specialty Comments:  No specialty comments available.  Imaging: No results found.   PMFS History: Patient Active Problem List   Diagnosis Date Noted  . Hyperglycemia 01/31/2016  . Depression with anxiety 01/18/2015  . Sinusitis 12/05/2013  . Hyperlipidemia, mixed 05/20/2013  . Bilateral hand pain 12/13/2011  . Preventative health care 12/13/2011  . SACROILIITIS, LEFT 11/23/2010  . Overweight 10/11/2010  . UNSPECIFIED ANEMIA 10/11/2010  . Allergic rhinitis 10/11/2010  . PLANTAR FASCIITIS, LEFT 10/11/2010  . ELEVATED BP READING WITHOUT DX HYPERTENSION 10/11/2010  . HEART MURMUR, HX OF  10/11/2010  . CROUP, HX OF 10/11/2010   Past Medical History:  Diagnosis Date  . Abnormal Pap smear    CIN I-LEEP  . Depression    h/o depression/anxiety  . Depression with anxiety 01/18/2015  . GERD (gastroesophageal reflux disease)   . HSV infection   . Hyperglycemia 01/31/2016  . Other and unspecified hyperlipidemia 05/20/2013  . Stress fracture 2008   left foot  . Trigger finger of left hand    ring finger    Family History  Problem Relation Age of Onset  . Other Mother        CHF  . Diabetes Mother        Borderline  . Hypertension Mother   . Obesity Mother   . Anxiety disorder Mother   . Other Father        CHF  . Obesity Sister   . Diabetes Sister        type 2  . Hypertension Sister   . Anxiety disorder Sister   . Diverticulosis Sister   . Hyperlipidemia Sister   . Obesity Brother   . Anxiety disorder Brother   . Cancer Maternal Aunt        breast  . Cancer Paternal Aunt        Breast  . Obesity Paternal Grandmother   . Other Paternal Grandfather        Committed suicide during the [place] Depression  . Obesity Sister   . Anxiety disorder Sister   . Fibrocystic breast disease Sister   . Thyroid disease Sister   . Obesity Sister     Past Surgical History:  Procedure Laterality Date  . ANKLE SURGERY Left 07/2016  . FOOT SURGERY Left 04/15/14   left foot-bone spur, Dr. Sharol Given; second surgery 01/2015 to correct prior surgery  . FOOT SURGERY Left 5/16   Dr Beckey Downing at [place]  . HAMMER TOE SURGERY  10+ yrs   left foot  . LEEP  15+ yrs   normal paps since then  . REFRACTIVE SURGERY  5 yrs ago   b/l   Social History   Occupational History  . Not on file  Tobacco Use  . Smoking status: Former Smoker    Packs/day: 0.25    Last attempt to quit: 09/13/1983    Years since quitting: 33.9  . Smokeless tobacco: Never Used  Substance and Sexual Activity  . Alcohol use: Yes    Alcohol/week: 6.0 oz    Types: 10 Glasses of wine per week  . Drug use: No  . Sexual  activity: Yes    Partners: Male    Birth control/protection: None, Post-menopausal    Comment: no dietary restrictions, lives with husband, 4 dogs

## 2017-08-16 ENCOUNTER — Telehealth (INDEPENDENT_AMBULATORY_CARE_PROVIDER_SITE_OTHER): Payer: Self-pay | Admitting: Surgery

## 2017-08-16 NOTE — Telephone Encounter (Signed)
DONE. Patient aware CD will be placed up front.

## 2017-08-16 NOTE — Telephone Encounter (Signed)
Patient called just wanting a copy of her most recent xrays, her husband has an appointment in the morning at 10 and she was wondering if she could pick them up then. CB # [phone]

## 2017-08-31 ENCOUNTER — Ambulatory Visit (INDEPENDENT_AMBULATORY_CARE_PROVIDER_SITE_OTHER): Payer: Managed Care, Other (non HMO) | Admitting: Surgery

## 2017-09-01 ENCOUNTER — Other Ambulatory Visit: Payer: Self-pay

## 2017-09-01 MED ORDER — VENLAFAXINE HCL ER 75 MG PO CP24: ORAL_CAPSULE | ORAL | 0 refills | Status: DC

## 2017-10-27 ENCOUNTER — Ambulatory Visit (INDEPENDENT_AMBULATORY_CARE_PROVIDER_SITE_OTHER): Payer: Managed Care, Other (non HMO) | Admitting: Family Medicine

## 2017-10-27 ENCOUNTER — Encounter: Payer: Self-pay | Admitting: Family Medicine

## 2017-10-27 VITALS — BP 112/70 | HR 94 | Temp 97.7°F | Ht 62.0 in | Wt 186.4 lb

## 2017-10-27 DIAGNOSIS — B0229 Other postherpetic nervous system involvement: Secondary | ICD-10-CM | POA: Insufficient documentation

## 2017-10-27 MED ORDER — GABAPENTIN 400 MG PO CAPS: 400.0000 mg | ORAL_CAPSULE | Freq: Three times a day (TID) | ORAL | 0 refills | Status: DC

## 2017-10-27 NOTE — Progress Notes (Signed)
Pre visit review using our clinic review tool, if applicable. No additional management support is needed unless otherwise documented below in the visit note. 

## 2017-10-27 NOTE — Progress Notes (Signed)
Chief Complaint  Patient presents with  . Foot Pain    left foot pain and itching    Breanna Bridges is a 62 y.o. female here for a skin complaint.  Duration: 4 days Location: foot Pruritic? Yes Painful? Yes Drainage? No New soaps/lotions/topicals/detergents? No Sick contacts? No Other associated symptoms: had shingles after Xmas, similar symptoms recently started again Therapies tried thus far: lotion  ROS:  Const: No fevers Skin: As noted in HPI  Past Medical History:  Diagnosis Date  . Abnormal Pap smear    CIN I-LEEP  . Depression    h/o depression/anxiety  . Depression with anxiety 01/18/2015  . GERD (gastroesophageal reflux disease)   . HSV infection   . Hyperglycemia 01/31/2016  . Other and unspecified hyperlipidemia 05/20/2013  . Stress fracture 2008   left foot  . Trigger finger of left hand    ring finger    BP 112/70 (BP Location: Left Arm, Patient Position: Sitting, Cuff Size: Normal)   Pulse 94   Temp 97.7 F (36.5 C) (Oral)   Ht 5\' 2"  (1.575 m)   Wt 186 lb 6 oz (84.5 kg)   LMP 03/02/2011 (Approximate)   SpO2 96%   BMI 34.09 kg/m  Gen: awake, alert, appearing stated age Lungs: No accessory muscle use Skin: some excoriation over distal 1-2 MT's. No drainage, erythema, TTP, fluctuance Psych: Age appropriate judgment and insight  Post herpetic neuralgia - Plan: gabapentin (NEURONTIN) 400 MG capsule  Orders as above. No s/s's of infection today.  Topical capsaicin. Could try lidocaine patches. TAO for excoriation.  F/u prn. The patient voiced understanding and agreement to the plan.  [place], DO 10/27/17 7:34 AM

## 2017-10-27 NOTE — Patient Instructions (Addendum)
Apply triple antibiotic ointment twice daily over the open areas on your foot for 10-14 days.  Keep area hydrated with non-scented lotions as needed.  Ibuprofen 400-600 mg (2-3 over the counter strength tabs) every 6 hours as needed for pain.  Things to look out for: increasing pain not relieved by ibuprofen/acetaminophen, fevers, spreading redness, drainage of pus, or foul odor.  Topical capsaicin could be helpful.   Let us know if you need anything.

## 2017-12-01 ENCOUNTER — Telehealth: Payer: Self-pay

## 2017-12-01 MED ORDER — VENLAFAXINE HCL ER 75 MG PO CP24: ORAL_CAPSULE | ORAL | 0 refills | Status: DC

## 2017-12-01 NOTE — Telephone Encounter (Signed)
Copied from [place] [id number]. Topic: Quick Communication - See Telephone Encounter >> Nov 29, 2017 12:40 PM Oneta Rack wrote: Caller name: Travis  [id number] - [place], [place] [phone] (Phone) [phone] (Fax)  Reason for call:  Pharmacy requesting 90 day supply venlafaxine XR (EFFEXOR-XR) 75 MG 24 hr capsule     Rx sent in this am

## 2017-12-06 ENCOUNTER — Other Ambulatory Visit: Payer: Self-pay

## 2017-12-06 DIAGNOSIS — B0229 Other postherpetic nervous system involvement: Secondary | ICD-10-CM

## 2017-12-06 MED ORDER — GABAPENTIN 400 MG PO CAPS: 400.0000 mg | ORAL_CAPSULE | Freq: Three times a day (TID) | ORAL | 0 refills | Status: DC

## 2018-01-20 ENCOUNTER — Other Ambulatory Visit: Payer: Self-pay | Admitting: Obstetrics & Gynecology

## 2018-01-22 NOTE — Telephone Encounter (Signed)
AEX scheduled 6/19.  Rx completed.

## 2018-01-29 ENCOUNTER — Telehealth: Payer: Self-pay | Admitting: Obstetrics & Gynecology

## 2018-01-29 DIAGNOSIS — E2839 Other primary ovarian failure: Secondary | ICD-10-CM

## 2018-01-29 NOTE — Telephone Encounter (Signed)
Previous BMD order expired. Last AEX 11/18/17, next AEX 02/23/18.   New BMD order placed, call returned to patient, advised new order placed. Patient will call [facility] directly to schedule BMD.    Routing to provider for final review. Patient is agreeable to disposition. Will close encounter.

## 2018-01-29 NOTE — Telephone Encounter (Signed)
Patient called and said [facility] needs a new order for a bone density test because the old order expired.

## 2018-02-01 ENCOUNTER — Other Ambulatory Visit: Payer: Self-pay | Admitting: Obstetrics & Gynecology

## 2018-02-01 DIAGNOSIS — Z1231 Encounter for screening mammogram for malignant neoplasm of breast: Secondary | ICD-10-CM

## 2018-02-23 ENCOUNTER — Ambulatory Visit (INDEPENDENT_AMBULATORY_CARE_PROVIDER_SITE_OTHER): Payer: Managed Care, Other (non HMO) | Admitting: Obstetrics & Gynecology

## 2018-02-23 ENCOUNTER — Encounter: Payer: Self-pay | Admitting: Obstetrics & Gynecology

## 2018-02-23 ENCOUNTER — Other Ambulatory Visit (HOSPITAL_COMMUNITY)
Admission: RE | Admit: 2018-02-23 | Discharge: 2018-02-23 | Disposition: A | Discharge status: Home / Self Care | Payer: Managed Care, Other (non HMO) | Source: Ambulatory Visit | Attending: Obstetrics & Gynecology | Admitting: Obstetrics & Gynecology

## 2018-02-23 ENCOUNTER — Other Ambulatory Visit: Payer: Self-pay

## 2018-02-23 VITALS — BP 144/90 | HR 84 | Resp 16 | Ht 61.5 in | Wt 187.0 lb

## 2018-02-23 DIAGNOSIS — Z01419 Encounter for gynecological examination (general) (routine) without abnormal findings: Secondary | ICD-10-CM

## 2018-02-23 DIAGNOSIS — Z124 Encounter for screening for malignant neoplasm of cervix: Secondary | ICD-10-CM

## 2018-02-23 MED ORDER — FAMCICLOVIR 250 MG PO TABS: 250.0000 mg | ORAL_TABLET | Freq: Two times a day (BID) | ORAL | 4 refills | Status: DC

## 2018-02-23 NOTE — Progress Notes (Signed)
62 y.o. G0P0000 MarriedCaucasianF here for annual exam.  Doing well.  Had shingles on her foot this past year.  [place] [facility].  Hasn't been able to wear tennis shoes right now.  Denies vaginal bleeding.    Seeing Dr. Randel Pigg next month.  Will do blood work next month.     Patient's last menstrual period was 03/02/2011 (approximate).          Sexually active: Yes.    The current method of family planning is post menopausal status.    Exercising: No.  The patient does not participate in regular exercise at present. Smoker:  no  Health Maintenance: Pap:  07/30/15 Neg. HR HPV:Neg  History of abnormal Pap:  Yes, LEEP 1995 MMG:  09/14/16 Bx Left Fibrocystic changes with calcifications. Has appt 03/21/18  Colonoscopy:  12/11/09 Normal. F/u 10 years  BMD:  Has appt 03/21/18  TDaP:  2013 Pneumonia vaccine(s):  No Shingrix: Zostavax 2014 Hep C testing: 01/21/16 Neg  Screening Labs: PCP   reports that she quit smoking about 34 years ago. She smoked 0.25 packs per day. She has never used smokeless tobacco. She reports that she drinks about 6.0 oz of alcohol per week. She reports that she does not use drugs.  Past Medical History:  Diagnosis Date  . Abnormal Pap smear    CIN I-LEEP  . Depression    h/o depression/anxiety  . Depression with anxiety 01/18/2015  . GERD (gastroesophageal reflux disease)   . HSV infection   . Hyperglycemia 01/31/2016  . Other and unspecified hyperlipidemia 05/20/2013  . Stress fracture 2008   left foot  . Trigger finger of left hand    ring finger    Past Surgical History:  Procedure Laterality Date  . ANKLE SURGERY Left 07/2016  . FOOT SURGERY Left 04/15/14   left foot-bone spur, Dr. Sharol Given; second surgery 01/2015 to correct prior surgery  . FOOT SURGERY Left 5/16   Dr Beckey Downing at [facility]  . HAMMER TOE SURGERY  10+ yrs   left foot  . LEEP  15+ yrs   normal paps since then  . REFRACTIVE SURGERY  5 yrs ago   b/l    Current  Outpatient Medications  Medication Sig Dispense Refill  . aspirin 81 MG tablet Take 81 mg by mouth daily.      . betamethasone dipropionate (DIPROLENE) 0.05 % cream Apply topically 2 (two) times daily. 30 g 0  . calcium carbonate 200 MG capsule Take 400 mg by mouth daily.      . cetirizine (ZYRTEC) 10 MG tablet Take 10 mg by mouth daily.      . famciclovir (FAMVIR) 250 MG tablet TAKE 1 TABLET (250 MG TOTAL) BY MOUTH 2 (TWO) TIMES DAILY. 180 tablet 0  . fluticasone (FLONASE) 50 MCG/ACT nasal spray Place 1 spray into both nostrils daily. In spring, summer, fall 48 g 3  . multivitamin (THERAGRAN) per tablet Take 1 tablet by mouth daily.      . Omega-3 Fatty Acids (FISH OIL PO) Take by mouth.    . pantoprazole (PROTONIX) 40 MG tablet TAKE 1 TABLET (40 MG TOTAL) BY MOUTH DAILY. 90 tablet 3  . Probiotic Product (PROBIOTIC DAILY PO) Take by mouth daily.    Marland Kitchen venlafaxine XR (EFFEXOR-XR) 75 MG 24 hr capsule TAKE ONE CAPSULE BY MOUTH EVERY DAY WITH BREAKFAST 90 capsule 0   No current facility-administered medications for this visit.     Family History  Problem Relation Age of Onset  . Other Mother        CHF  . Diabetes Mother        Borderline  . Hypertension Mother   . Obesity Mother   . Anxiety disorder Mother   . Other Father        CHF  . Obesity Sister   . Diabetes Sister        type 2  . Hypertension Sister   . Anxiety disorder Sister   . Diverticulosis Sister   . Hyperlipidemia Sister   . Obesity Brother   . Anxiety disorder Brother   . Cancer Maternal Aunt        breast  . Cancer Paternal Aunt        Breast  . Obesity Paternal Grandmother   . Other Paternal Grandfather        Committed suicide during the [place] Depression  . Obesity Sister   . Anxiety disorder Sister   . Fibrocystic breast disease Sister   . Thyroid disease Sister   . Obesity Sister     Review of Systems  All other systems reviewed and are negative.   Exam:   BP (!) 144/90 (BP Location: Right  Arm, Patient Position: Sitting, Cuff Size: Large)   Pulse 84   Resp 16   Ht 5' 1.5" (1.562 m)   Wt 187 lb (84.8 kg)   LMP 03/02/2011 (Approximate)   BMI 34.76 kg/m   Height: 5' 1.5" (156.2 cm)  Ht Readings from Last 3 Encounters:  02/23/18 5' 1.5" (1.562 m)  10/27/17 5\' 2"  (1.575 m)  06/15/17 5' 1.5" (1.562 m)    General appearance: alert, cooperative and appears stated age Head: Normocephalic, without obvious abnormality, atraumatic Neck: no adenopathy, supple, symmetrical, trachea midline and thyroid normal to inspection and palpation Lungs: clear to auscultation bilaterally Breasts: normal appearance, no masses or tenderness Heart: regular rate and rhythm Abdomen: soft, non-tender; bowel sounds normal; no masses,  no organomegaly Extremities: extremities normal, atraumatic, no cyanosis or edema Skin: Skin color, texture, turgor normal. No rashes or lesions Lymph nodes: Cervical, supraclavicular, and axillary nodes normal. No abnormal inguinal nodes palpated Neurologic: Grossly normal   Pelvic: External genitalia:  no lesions              Urethra:  normal appearing urethra with no masses, tenderness or lesions              Bartholins and Skenes: normal                 Vagina: normal appearing vagina with normal color and discharge, no lesions              Cervix: no lesions              Pap taken: Yes.   Bimanual Exam:  Uterus:  normal size, contour, position, consistency, mobility, non-tender              Adnexa: normal adnexa and no mass, fullness, tenderness               Rectovaginal: Confirms               Anus:  normal sphincter tone, no lesions  Chaperone was present for exam.  A:  Well Woman with normal exam PMP, no HRT Shingles H/O depression Mildly elevated BP today  P:   Mammogram guidelines reviewed.  Has scheduled. BMD is also scheduled Colonoscopy is UTD pap smear obtained.  Neg HR HPV 11/16. Rx for famvir 250mg  BID #180/4RF Lab work will be done  with Dr. Randel Pigg next month Pt is going to watch BP and write down some values for when she sees Dr. Randel Pigg return annually or prn

## 2018-02-23 NOTE — Patient Instructions (Signed)
Famvir 500mg  three times daily for 7 days

## 2018-02-26 LAB — CYTOLOGY - PAP: Diagnosis: NEGATIVE

## 2018-03-02 ENCOUNTER — Other Ambulatory Visit: Payer: Self-pay | Admitting: Family Medicine

## 2018-03-20 ENCOUNTER — Other Ambulatory Visit: Payer: Self-pay | Admitting: Family Medicine

## 2018-03-21 ENCOUNTER — Ambulatory Visit
Admission: RE | Admit: 2018-03-21 | Discharge: 2018-03-21 | Disposition: A | Discharge status: Home / Self Care | Payer: Managed Care, Other (non HMO) | Source: Ambulatory Visit | Attending: Obstetrics & Gynecology | Admitting: Obstetrics & Gynecology

## 2018-03-21 DIAGNOSIS — Z1231 Encounter for screening mammogram for malignant neoplasm of breast: Secondary | ICD-10-CM

## 2018-03-21 DIAGNOSIS — E2839 Other primary ovarian failure: Secondary | ICD-10-CM

## 2018-04-03 ENCOUNTER — Ambulatory Visit: Payer: Self-pay | Admitting: *Deleted

## 2018-04-03 NOTE — Telephone Encounter (Signed)
No answer. Left VM to phone back if she would like to speak with a nurse re her rash.

## 2018-04-03 NOTE — Telephone Encounter (Signed)
Patient has small fluid filled bumps on the inner aspects of both elbows and between her breast. These ares are about the size of a lemon. "a lot of itching" in all 3 areas. Stated the benadryl cream burns the rash and she can't tolerated it. She was treated with doxycycline by an urgent care in [facility] and completed it last week. The itching improved while on the abx but has since started itching again. Denies any red streaks from the area/no pus filled bumps. She was seen by Dr. Nani Ravens on 10/27/17 for post herpetic neuralgic pain of her left foot. She reports tingling in both elbows. Care advice given.Appointment requested.  No availability with PCP. Appointment schedules for tomorrow with Justine Null, PA Reason for Disposition . Mild rash from poison ivy, oak or sumac  Answer Assessment - Initial Assessment Questions 1. APPEARANCE of RASH: "Describe the rash."     Small fluid filled bumps. 2. LOCATION: "Where is the rash located?"      Inner elbows and between breast 3. SIZE: "How large is the rash?"      Size area of a lemon 4. ONSET: "When did the rash begin?"      One-two weeks 5. ITCHING: "Does the rash itch?" If so, ask: "How bad is it?"   - MILD - doesn't interfere with normal activities   - MODERATE - SEVERE: interferes with work, school, sleep, or other activities      Itches alot 6. PREGNANCY: "Is there any chance you are pregnant?" "When was your last menstrual period?"     no  Protocols used: POISON IVY - OAK - SUMAC-A-AH

## 2018-04-04 ENCOUNTER — Encounter: Payer: Self-pay | Admitting: Medical

## 2018-04-04 ENCOUNTER — Ambulatory Visit (INDEPENDENT_AMBULATORY_CARE_PROVIDER_SITE_OTHER): Payer: Managed Care, Other (non HMO) | Admitting: Medical

## 2018-04-04 VITALS — BP 121/74 | HR 96 | Temp 98.0°F | Resp 16 | Ht 61.5 in | Wt 186.6 lb

## 2018-04-04 DIAGNOSIS — T7840XA Allergy, unspecified, initial encounter: Secondary | ICD-10-CM

## 2018-04-04 DIAGNOSIS — R739 Hyperglycemia, unspecified: Secondary | ICD-10-CM | POA: Diagnosis not present

## 2018-04-04 DIAGNOSIS — T148XXD Other injury of unspecified body region, subsequent encounter: Secondary | ICD-10-CM | POA: Diagnosis not present

## 2018-04-04 LAB — CBC WITH DIFFERENTIAL/PLATELET
BASOS ABS: 0 10*3/uL (ref 0.0–0.1)
Basophils Relative: 0.9 % (ref 0.0–3.0)
Eosinophils Absolute: 0.2 10*3/uL (ref 0.0–0.7)
Eosinophils Relative: 4.1 % (ref 0.0–5.0)
HCT: 40.3 % (ref 36.0–46.0)
Hemoglobin: 13.5 g/dL (ref 12.0–15.0)
LYMPHS ABS: 1 10*3/uL (ref 0.7–4.0)
Lymphocytes Relative: 21.1 % (ref 12.0–46.0)
MCHC: 33.6 g/dL (ref 30.0–36.0)
MCV: 90.4 fl (ref 78.0–100.0)
Monocytes Absolute: 0.4 10*3/uL (ref 0.1–1.0)
Monocytes Relative: 7.5 % (ref 3.0–12.0)
NEUTROS PCT: 66.4 % (ref 43.0–77.0)
Neutro Abs: 3.2 10*3/uL (ref 1.4–7.7)
Platelets: 224 10*3/uL (ref 150.0–400.0)
RBC: 4.45 Mil/uL (ref 3.87–5.11)
RDW: 14 % (ref 11.5–15.5)
WBC: 4.8 10*3/uL (ref 4.0–10.5)

## 2018-04-04 LAB — COMPREHENSIVE METABOLIC PANEL
ALBUMIN: 4.6 g/dL (ref 3.5–5.2)
ALT: 25 U/L (ref 0–35)
AST: 23 U/L (ref 0–37)
Alkaline Phosphatase: 89 U/L (ref 39–117)
BILIRUBIN TOTAL: 0.5 mg/dL (ref 0.2–1.2)
BUN: 20 mg/dL (ref 6–23)
CO2: 29 mEq/L (ref 19–32)
Calcium: 9.8 mg/dL (ref 8.4–10.5)
Chloride: 103 mEq/L (ref 96–112)
Creatinine, Ser: 0.72 mg/dL (ref 0.40–1.20)
GFR: 87.28 mL/min (ref >60.00)
Glucose, Bld: 98 mg/dL (ref 70–99)
Potassium: 4 mEq/L (ref 3.5–5.1)
Sodium: 141 mEq/L (ref 135–145)
Total Protein: 7.4 g/dL (ref 6.0–8.3)

## 2018-04-04 LAB — HEMOGLOBIN A1C: HEMOGLOBIN A1C: 5.4 % (ref 4.6–6.5)

## 2018-04-04 MED ORDER — HYDROXYZINE HCL 10 MG PO TABS: ORAL_TABLET | ORAL | 0 refills | Status: DC

## 2018-04-04 MED ORDER — PREDNISONE 10 MG PO TABS: ORAL_TABLET | ORAL | 0 refills | Status: DC

## 2018-04-04 MED ORDER — METHYLPREDNISOLONE ACETATE 40 MG/ML IJ SUSP
40.0000 mg | Freq: Once | INTRAMUSCULAR | Status: AC
  Administered 2018-04-04: 40 mg via INTRAMUSCULAR

## 2018-04-04 MED ORDER — DOXYCYCLINE HYCLATE 100 MG PO TABS: 100.0000 mg | ORAL_TABLET | Freq: Two times a day (BID) | ORAL | 0 refills | Status: DC

## 2018-04-04 MED ORDER — MUPIROCIN 2 % EX OINT
TOPICAL_OINTMENT | CUTANEOUS | 0 refills | Status: DC
Start: 2018-04-04 — End: 2018-06-07

## 2018-04-04 NOTE — Progress Notes (Signed)
Subjective:    Patient ID: CECILA SATCHER, female    DOB: 06-12-1956, 62 y.o.   MRN: [id number]  HPI   Pt in with both upper arm rash and upper chest/breast rash. Pt states rash seemed to start about 3 weeks ago.   Pt went to urgent care and they were concerned maybe bacterial. They gave doxycycline but the area did not resolve.   Pt tried some aveeno and cold compresses last night. Seemed to help itch last night.  Pt can't identify cause on review. No suspicious exposure that she can identify. Denies any insect bites. Then mentioned maybe poison ivy exposure. But had rash present before exposure.  Rash left foot chronic since January. She was told it was shingles and was treated rash in foot never got better. She was given mupirocin for left foot/toe area. She used and it did not help.She used mupirocin intermittently.     Review of Systems  Constitutional: Negative for chills, fatigue and fever.  Respiratory: Negative for cough, chest tightness, shortness of breath and wheezing.   Cardiovascular: Negative for chest pain and palpitations.  Musculoskeletal: Negative for back pain.  Skin: Positive for rash.       See hpi.  Neurological: Negative for dizziness and headaches.  Hematological: Negative for adenopathy. Does not bruise/bleed easily.  Psychiatric/Behavioral: Negative for behavioral problems and confusion.     Past Medical History:  Diagnosis Date  . Abnormal Pap smear    CIN I-LEEP  . Depression    h/o depression/anxiety  . Depression with anxiety 01/18/2015  . GERD (gastroesophageal reflux disease)   . HSV infection   . Hyperglycemia 01/31/2016  . Other and unspecified hyperlipidemia 05/20/2013  . Stress fracture 2008   left foot  . Trigger finger of left hand    ring finger     Social History   Socioeconomic History  . Marital status: Married    Spouse name: Not on file  . Number of children: Not on file  . Years of education: Not on file  . Highest  education level: Not on file  Occupational History  . Not on file  Social Needs  . Financial resource strain: Not on file  . Food insecurity:    Worry: Not on file    Inability: Not on file  . Transportation needs:    Medical: Not on file    Non-medical: Not on file  Tobacco Use  . Smoking status: Former Smoker    Packs/day: 0.25    Last attempt to quit: 09/13/1983    Years since quitting: 34.5  . Smokeless tobacco: Never Used  Substance and Sexual Activity  . Alcohol use: Yes    Alcohol/week: 6.0 oz    Types: 10 Glasses of wine per week  . Drug use: No  . Sexual activity: Yes    Partners: Male    Birth control/protection: None, Post-menopausal    Comment: no dietary restrictions, lives with husband, 4 dogs  Lifestyle  . Physical activity:    Days per week: Not on file    Minutes per session: Not on file  . Stress: Not on file  Relationships  . Social connections:    Talks on phone: Not on file    Gets together: Not on file    Attends religious service: Not on file    Active member of club or organization: Not on file    Attends meetings of clubs or organizations: Not on file  Relationship status: Not on file  . Intimate partner violence:    Fear of current or ex partner: Not on file    Emotionally abused: Not on file    Physically abused: Not on file    Forced sexual activity: Not on file  Other Topics Concern  . Not on file  Social History Narrative   Lives with Obie Dredge, husband, no major dietary restrictions, works in Pharmacologist, Presenter, broadcasting, Associate Professor    Past Surgical History:  Procedure Laterality Date  . ANKLE SURGERY Left 07/2016  . BREAST BIOPSY Left 2018  . FOOT SURGERY Left 04/15/14   left foot-bone spur, Dr. Sharol Given; second surgery 01/2015 to correct prior surgery  . FOOT SURGERY Left 5/16   Dr Beckey Downing at [facility]  . HAMMER TOE SURGERY  10+ yrs   left foot  . LEEP  15+ yrs   normal paps since then  . REFRACTIVE SURGERY  5 yrs ago   b/l    Family History   Problem Relation Age of Onset  . Other Mother        CHF  . Diabetes Mother        Borderline  . Hypertension Mother   . Obesity Mother   . Anxiety disorder Mother   . Other Father        CHF  . Obesity Sister   . Diabetes Sister        type 2  . Hypertension Sister   . Anxiety disorder Sister   . Diverticulosis Sister   . Hyperlipidemia Sister   . Obesity Brother   . Anxiety disorder Brother   . Cancer Maternal Aunt        breast  . Cancer Paternal Aunt        Breast  . Obesity Paternal Grandmother   . Other Paternal Grandfather        Committed suicide during the [place] Depression  . Obesity Sister   . Anxiety disorder Sister   . Fibrocystic breast disease Sister   . Thyroid disease Sister   . Obesity Sister     Allergies  Allergen Reactions  . Codeine     REACTION: nausea    Current Outpatient Medications on File Prior to Visit  Medication Sig Dispense Refill  . aspirin 81 MG tablet Take 81 mg by mouth daily.      . calcium carbonate 200 MG capsule Take 400 mg by mouth daily.      . cetirizine (ZYRTEC) 10 MG tablet Take 10 mg by mouth daily.      . famciclovir (FAMVIR) 250 MG tablet Take 1 tablet (250 mg total) by mouth 2 (two) times daily. 180 tablet 4  . fluticasone (FLONASE) 50 MCG/ACT nasal spray Place 1 spray into both nostrils daily. In spring, summer, fall 48 g 3  . multivitamin (THERAGRAN) per tablet Take 1 tablet by mouth daily.      . Omega-3 Fatty Acids (FISH OIL PO) Take by mouth.    . pantoprazole (PROTONIX) 40 MG tablet TAKE 1 TABLET BY MOUTH EVERY DAY 90 tablet 3  . Probiotic Product (PROBIOTIC DAILY PO) Take by mouth daily.    Marland Kitchen venlafaxine XR (EFFEXOR-XR) 75 MG 24 hr capsule TAKE ONE CAPSULE BY MOUTH EVERY DAY WITH BREAKFAST 90 capsule 0   No current facility-administered medications on file prior to visit.     BP 121/74   Pulse 96   Temp 98 F (36.7 C) (Oral)   Resp 16  Ht 5' 1.5" (1.562 m)   Wt 186 lb 9.6 oz (84.6 kg)   LMP  03/02/2011 (Approximate)   SpO2 100%   BMI 34.69 kg/m       Objective:   Physical Exam   General- No acute distress. Pleasant patient. Neck- Full range of motion, no jvd Lungs- Clear, even and unlabored. Heart- regular rate and rhythm. Neurologic- CNII- XII grossly intact.  Skin-patient has scattered papular rash on anterior aspect of forearms and in the antecubital fossa.  Some scattered scabs as well.  Similar appearance between breast upper portion.  Also on her left foot she has some scattered scabs on the proximal aspect of the foot beneath the ankle.  Between her second and third toe she has a little bit of maceration of skin.  Epidermis is peeled back a little bit.  This area in between the toes is about 6 mm x 4 mm.  There is no yellow creamy discharge presently.  The surrounding skin is not red or warm.  The region between the toes more has serosanguineous watery discharge.  No ulceration seen..    Assessment & Plan:  You do appear to have probable allergic reaction but because not identified presently.  We will give you Depo-Medrol 40 mg IM injection and 6-day taper dose of prednisone.  A prescription of hydroxyzine for the itching.  For your left foot chronic intermittent rash/lesion, I did get a wound culture and will follow that.  I am refilling your mupirocin.  If that area appears to get obviously infected as discussed then you could fill the doxycycline.    Depending on how you do might need to refer you to dermatologist for either recent rash or the chronic foot rash.  Follow-up in 7 to 10 days or as needed.  Mackie Pai, PA-C

## 2018-04-04 NOTE — Patient Instructions (Addendum)
You do appear to have probable allergic reaction but because not identified presently.  We will give you Depo-Medrol 40 mg IM injection and 6 day taper dose of prednisone.  A prescription of hydroxyzine for the itching.  For your left foot chronic intermittent rash/lesion, I did get a wound culture and will follow that.  I am refilling your mupirocin.  If that area appears to get obviously infected as discussed then you could fill the doxycycline.    Depending on how you do might need to refer you to dermatologist for either recent rash or the chronic foot rash.  Follow-up in 7 to 10 days or as needed.

## 2018-04-06 LAB — WOUND CULTURE
MICRO NUMBER:: 90875850
SPECIMEN QUALITY:: ADEQUATE

## 2018-05-29 ENCOUNTER — Other Ambulatory Visit: Payer: Self-pay | Admitting: Family Medicine

## 2018-06-07 ENCOUNTER — Other Ambulatory Visit: Payer: Self-pay | Admitting: Medical

## 2018-07-16 ENCOUNTER — Encounter: Payer: Self-pay | Admitting: Family Medicine

## 2018-07-18 ENCOUNTER — Encounter: Payer: Self-pay | Admitting: Medical

## 2018-07-18 ENCOUNTER — Ambulatory Visit (HOSPITAL_BASED_OUTPATIENT_CLINIC_OR_DEPARTMENT_OTHER)
Admission: RE | Admit: 2018-07-18 | Discharge: 2018-07-18 | Disposition: A | Discharge status: Home / Self Care | Payer: Managed Care, Other (non HMO) | Source: Ambulatory Visit | Attending: Medical | Admitting: Medical

## 2018-07-18 ENCOUNTER — Ambulatory Visit (INDEPENDENT_AMBULATORY_CARE_PROVIDER_SITE_OTHER): Payer: Managed Care, Other (non HMO) | Admitting: Medical

## 2018-07-18 VITALS — BP 139/70 | HR 110 | Temp 98.9°F | Resp 16 | Ht 61.0 in | Wt 182.0 lb

## 2018-07-18 DIAGNOSIS — R062 Wheezing: Secondary | ICD-10-CM | POA: Insufficient documentation

## 2018-07-18 DIAGNOSIS — R509 Fever, unspecified: Secondary | ICD-10-CM | POA: Diagnosis not present

## 2018-07-18 DIAGNOSIS — R5383 Other fatigue: Secondary | ICD-10-CM

## 2018-07-18 DIAGNOSIS — J40 Bronchitis, not specified as acute or chronic: Secondary | ICD-10-CM | POA: Diagnosis not present

## 2018-07-18 DIAGNOSIS — R059 Cough, unspecified: Secondary | ICD-10-CM

## 2018-07-18 DIAGNOSIS — R05 Cough: Secondary | ICD-10-CM | POA: Insufficient documentation

## 2018-07-18 DIAGNOSIS — J9811 Atelectasis: Secondary | ICD-10-CM | POA: Diagnosis not present

## 2018-07-18 LAB — CBC WITH DIFFERENTIAL/PLATELET
Basophils Absolute: 0 10*3/uL (ref 0.0–0.1)
Basophils Relative: 0.5 % (ref 0.0–3.0)
Eosinophils Absolute: 0.1 10*3/uL (ref 0.0–0.7)
Eosinophils Relative: 1.5 % (ref 0.0–5.0)
HCT: 34.5 % — ABNORMAL LOW (ref 36.0–46.0)
Hemoglobin: 11.8 g/dL — ABNORMAL LOW (ref 12.0–15.0)
LYMPHS ABS: 0.8 10*3/uL (ref 0.7–4.0)
Lymphocytes Relative: 10.2 % — ABNORMAL LOW (ref 12.0–46.0)
MCHC: 34.2 g/dL (ref 30.0–36.0)
MCV: 87.7 fl (ref 78.0–100.0)
MONO ABS: 1 10*3/uL (ref 0.1–1.0)
Monocytes Relative: 12.2 % — ABNORMAL HIGH (ref 3.0–12.0)
NEUTROS ABS: 6.1 10*3/uL (ref 1.4–7.7)
NEUTROS PCT: 75.6 % (ref 43.0–77.0)
PLATELETS: 223 10*3/uL (ref 150.0–400.0)
RBC: 3.93 Mil/uL (ref 3.87–5.11)
RDW: 13.8 % (ref 11.5–15.5)
WBC: 8.1 10*3/uL (ref 4.0–10.5)

## 2018-07-18 LAB — COMPREHENSIVE METABOLIC PANEL
ALT: 32 U/L (ref 0–35)
AST: 27 U/L (ref 0–37)
Albumin: 4.1 g/dL (ref 3.5–5.2)
Alkaline Phosphatase: 83 U/L (ref 39–117)
BUN: 13 mg/dL (ref 6–23)
CHLORIDE: 99 meq/L (ref 96–112)
CO2: 24 meq/L (ref 19–32)
Calcium: 9.2 mg/dL (ref 8.4–10.5)
Creatinine, Ser: 0.64 mg/dL (ref 0.40–1.20)
GFR: 99.89 mL/min (ref >60.00)
GLUCOSE: 104 mg/dL — AB (ref 70–99)
Potassium: 3.3 mEq/L — ABNORMAL LOW (ref 3.5–5.1)
SODIUM: 135 meq/L (ref 135–145)
TOTAL PROTEIN: 6.8 g/dL (ref 6.0–8.3)
Total Bilirubin: 0.5 mg/dL (ref 0.2–1.2)

## 2018-07-18 LAB — POC INFLUENZA A&B (BINAX/QUICKVUE)
INFLUENZA B, POC: NEGATIVE
Influenza A, POC: NEGATIVE

## 2018-07-18 MED ORDER — PREDNISONE 10 MG PO TABS: ORAL_TABLET | ORAL | 0 refills | Status: DC

## 2018-07-18 MED ORDER — METHYLPREDNISOLONE ACETATE 40 MG/ML IJ SUSP
40.0000 mg | Freq: Once | INTRAMUSCULAR | Status: AC
  Administered 2018-07-18: 40 mg via INTRAMUSCULAR

## 2018-07-18 MED ORDER — ALBUTEROL SULFATE HFA 108 (90 BASE) MCG/ACT IN AERS: 2.0000 | INHALATION_SPRAY | Freq: Four times a day (QID) | RESPIRATORY_TRACT | 2 refills | Status: DC | PRN

## 2018-07-18 MED ORDER — HYDROCODONE-HOMATROPINE 5-1.5 MG/5ML PO SYRP: 5.0000 mL | ORAL_SOLUTION | Freq: Three times a day (TID) | ORAL | 0 refills | Status: DC | PRN

## 2018-07-18 MED ORDER — CEFTRIAXONE SODIUM 1 G IJ SOLR
1.0000 g | Freq: Once | INTRAMUSCULAR | Status: AC
  Administered 2018-07-18: 1 g via INTRAMUSCULAR

## 2018-07-18 NOTE — Progress Notes (Signed)
   Subjective:    Patient ID: Breanna Bridges, female    DOB: [birth year], 62 y.o.   MRN: [id number]  HPI  Pt had fever last 2 days. 101-102. Pt has some body aches. She is coughing a lot. Coughing so much she throws up. Trouble sleeping. When breaths has some crackles. Some wheeze also.  Pt states symptoms seemed to start around 3 weeks. Seemed to flare after she was getting after she was getting over bronchitis.  Pt not diabetic.  Pt went to UC and they gave prednisone, phenergan and doxycycline. She was treated on Jun 27, 2018. Symptoms persist despite this treatment.  Non smoker. None for 40 years.  Review of Systems  Constitutional: Negative for chills, fatigue and fever.  Respiratory: Positive for cough and wheezing. Negative for chest tightness and shortness of breath.   Cardiovascular: Negative for chest pain and palpitations.  Gastrointestinal: Negative for abdominal distention and anal bleeding.  Skin: Negative for rash.  Hematological: Negative for adenopathy. Does not bruise/bleed easily.  Psychiatric/Behavioral: Negative for behavioral problems and confusion.       Objective:   Physical Exam  General  Mental Status - Alert. General Appearance - Well groomed. Not in acute distress.  Skin Rashes- No Rashes.  HEENT Head- Normal. Ear Auditory Canal - Left- Normal. Right - Normal.Tympanic Membrane- Left- Normal. Right- Normal. Eye Sclera/Conjunctiva- Left- Normal. Right- Normal. Nose & Sinuses Nasal Mucosa- Left-  Boggy and Congested. Right-  Boggy and  Congested.Bilateral no maxillary and no frontal sinus pressure. Mouth & Throat Lips: Upper Lip- Normal: no dryness, cracking, pallor, cyanosis, or vesicular eruption. Lower Lip-Normal: no dryness, cracking, pallor, cyanosis or vesicular eruption. Buccal Mucosa- Bilateral- No Aphthous ulcers. Oropharynx- No Discharge or Erythema. Tonsils: Characteristics- Bilateral- No Erythema or Congestion. Size/Enlargement-  Bilateral- No enlargement. Discharge- bilateral-None.  Neck Neck- Supple. No Masses.   Chest and Lung Exam Auscultation: Breath Sounds:- even and unlabored.  Bilateral coarse breath sounds with scattered wheezing.  No accessory muscles used for breathing.  Cardiovascular Auscultation:Rythm- Regular, rate and rhythm. Murmurs & Other Heart Sounds:Ausculatation of the heart reveal- No Murmurs.  Lymphatic Head & Neck General Head & Neck Lymphatics: Bilateral: Description- No Localized lymphadenopathy.      Assessment & Plan:  [phone]  Based on your described signs and symptoms over the weeks and your physical exam I have concern for lingering bronchitis versus possible pneumonia.  I do want you to get a chest x-ray and CBC today.  We will give you Rocephin 1 g in office today and Depo-Medrol 40mg  IM.  For cough, I prescribed the Hycodan.  Rx advisement given.  No prior reaction to hydrocodone when you used that for pain in the past.  Sedation is common side effect.  For recent wheezing, prescription of tapered prednisone given.  Also prescribing albuterol inhaler.  After x-ray results reviewed will decide on oral antibiotic prescription.  May decide on getting levofloxacin.  Rx advisement given for that antibiotic in the event that is prescribed.  Follow-up in 2 to 5 days or as needed.  If signs and symptoms were to change dramatically after hours despite the above measures then recommend ED evaluation.  Mackie Pai, PA-C

## 2018-07-18 NOTE — Patient Instructions (Signed)
Based on your described signs and symptoms over the weeks and your physical exam I have concern for lingering bronchitis versus possible pneumonia.  I do want you to get a chest x-ray and CBC today.  We will give you Rocephin 1 g in office today and Depo-Medrol 40mg  IM.  For cough, I prescribed the Hycodan.  Rx advisement given.  No prior reaction to hydrocodone when you used that for pain in the past.  Sedation is common side effect.  For recent wheezing, prescription of tapered prednisone given.  Also prescribing albuterol inhaler.  After x-ray results reviewed will decide on oral antibiotic prescription.  May decide on getting levofloxacin.  Rx advisement given for that antibiotic in the event that is prescribed.  Follow-up in 2 to 5 days or as needed.  If signs and symptoms were to change dramatically after hours despite the above measures then recommend ED evaluation.

## 2018-07-19 ENCOUNTER — Telehealth: Payer: Self-pay | Admitting: Medical

## 2018-07-19 MED ORDER — AZITHROMYCIN 250 MG PO TABS: ORAL_TABLET | ORAL | 0 refills | Status: DC

## 2018-07-19 NOTE — Telephone Encounter (Signed)
zpack sent to pt pharmacy. 

## 2018-07-20 ENCOUNTER — Encounter: Payer: Self-pay | Admitting: Medical

## 2018-07-23 ENCOUNTER — Encounter: Payer: Self-pay | Admitting: Medical

## 2018-07-24 ENCOUNTER — Ambulatory Visit: Payer: Managed Care, Other (non HMO) | Admitting: Medical

## 2018-08-28 ENCOUNTER — Other Ambulatory Visit: Payer: Self-pay | Admitting: Family Medicine

## 2018-12-05 ENCOUNTER — Other Ambulatory Visit: Payer: Self-pay | Admitting: Family Medicine

## 2019-01-30 ENCOUNTER — Other Ambulatory Visit: Payer: Self-pay | Admitting: Family Medicine

## 2019-03-02 ENCOUNTER — Other Ambulatory Visit: Payer: Self-pay | Admitting: Family Medicine

## 2019-03-03 ENCOUNTER — Other Ambulatory Visit: Payer: Self-pay | Admitting: Obstetrics & Gynecology

## 2019-03-04 NOTE — Telephone Encounter (Signed)
Medication refill request: Famvir 250mg  Last AEX:  02/23/18  Next AEX: 06/04/19 Last MMG (if hormonal medication request): 03/21/18 Bi-rads 1 neg  Refill authorized: #180 with 0 Rf today please advise.

## 2019-03-14 ENCOUNTER — Other Ambulatory Visit: Payer: Self-pay | Admitting: Family Medicine

## 2019-05-23 ENCOUNTER — Other Ambulatory Visit: Payer: Self-pay | Admitting: Obstetrics & Gynecology

## 2019-05-23 DIAGNOSIS — Z1231 Encounter for screening mammogram for malignant neoplasm of breast: Secondary | ICD-10-CM

## 2019-05-29 ENCOUNTER — Other Ambulatory Visit: Payer: Self-pay | Admitting: Obstetrics & Gynecology

## 2019-05-29 NOTE — Telephone Encounter (Signed)
Medication refill request: Famvir Last AEX:  02/23/2018 SM Next AEX: 06/04/2019 Last MMG (if hormonal medication request): 03/21/2018 BIRADS 1 Negative Density B -  Has MMG scheduled for 07/05/2019 Refill authorized: Pending #[id number] with no refills if appropriate. Please advise.

## 2019-05-30 ENCOUNTER — Other Ambulatory Visit: Payer: Self-pay | Admitting: Family Medicine

## 2019-05-31 ENCOUNTER — Other Ambulatory Visit: Payer: Self-pay

## 2019-06-04 ENCOUNTER — Encounter: Payer: Self-pay | Admitting: Obstetrics & Gynecology

## 2019-06-04 ENCOUNTER — Other Ambulatory Visit (HOSPITAL_COMMUNITY)
Admission: RE | Admit: 2019-06-04 | Discharge: 2019-06-04 | Disposition: A | Discharge status: Home / Self Care | Payer: Managed Care, Other (non HMO) | Source: Ambulatory Visit | Attending: Obstetrics & Gynecology | Admitting: Obstetrics & Gynecology

## 2019-06-04 ENCOUNTER — Other Ambulatory Visit: Payer: Self-pay

## 2019-06-04 ENCOUNTER — Ambulatory Visit (INDEPENDENT_AMBULATORY_CARE_PROVIDER_SITE_OTHER): Payer: Managed Care, Other (non HMO) | Admitting: Obstetrics & Gynecology

## 2019-06-04 VITALS — BP 152/86 | HR 84 | Temp 98.2°F | Ht 61.0 in | Wt 187.0 lb

## 2019-06-04 DIAGNOSIS — Z124 Encounter for screening for malignant neoplasm of cervix: Secondary | ICD-10-CM | POA: Diagnosis not present

## 2019-06-04 DIAGNOSIS — Z01419 Encounter for gynecological examination (general) (routine) without abnormal findings: Secondary | ICD-10-CM | POA: Diagnosis not present

## 2019-06-04 DIAGNOSIS — N879 Dysplasia of cervix uteri, unspecified: Secondary | ICD-10-CM

## 2019-06-04 MED ORDER — FAMCICLOVIR 250 MG PO TABS: 250.0000 mg | ORAL_TABLET | Freq: Two times a day (BID) | ORAL | 4 refills | Status: DC

## 2019-06-04 NOTE — Progress Notes (Signed)
63 y.o. G0P0000 Married White or Caucasian female here for annual exam.  Doing well.  Working from home.  Husband had back surgery in late July.  Had severe back pain and the hardware shifted.  Has infection around the hardware.  He now has a PICC line and is getting antibiotics and heparin.    She's very frustrate with her weight.  Patient's last menstrual period was 03/02/2011 (approximate).          Sexually active: Yes.    The current method of family planning is post menopausal status.    Exercising: No.   Smoker:  no  Health Maintenance: Pap:  02/23/18 neg   07/30/15 Neg. HR HPV:neg  History of abnormal Pap:  Yes, LEEP 1995 MMG:  03/21/18 BIRADS1:neg. Has appt 07/05/19  Colonoscopy:  12/11/09 normal. F/u 10 years  BMD:   03/21/18 Normal  TDaP:  2013 Pneumonia vaccine(s):  No Shingrix:   Discussed today Hep C testing: 01/21/16 Neg  Screening Labs: PCP.  Declines doing blood work today.     reports that she quit smoking about 35 years ago. She smoked 0.25 packs per day. She has never used smokeless tobacco. She reports current alcohol use of about 14.0 standard drinks of alcohol per week. She reports that she does not use drugs.  Past Medical History:  Diagnosis Date  . Abnormal Pap smear    CIN I-LEEP  . Depression    h/o depression/anxiety  . Depression with anxiety 01/18/2015  . GERD (gastroesophageal reflux disease)   . HSV infection   . Hyperglycemia 01/31/2016  . Other and unspecified hyperlipidemia 05/20/2013  . Stress fracture 2008   left foot  . Trigger finger of left hand    ring finger    Past Surgical History:  Procedure Laterality Date  . ANKLE SURGERY Left 07/2016  . BREAST BIOPSY Left 2018  . FOOT SURGERY Left 04/15/14   left foot-bone spur, Dr. Sharol Given; second surgery 01/2015 to correct prior surgery  . FOOT SURGERY Left 5/16   Dr Beckey Downing at [facility]  . HAMMER TOE SURGERY  10+ yrs   left foot  . LEEP  15+ yrs   normal paps since then  . REFRACTIVE SURGERY  5 yrs  ago   b/l    Current Outpatient Medications  Medication Sig Dispense Refill  . aspirin 81 MG tablet Take 81 mg by mouth daily.      . calcium carbonate 200 MG capsule Take 400 mg by mouth daily.      . cetirizine (ZYRTEC) 10 MG tablet Take 10 mg by mouth daily.      . famciclovir (FAMVIR) 250 MG tablet TAKE 1 TABLET BY MOUTH TWICE A DAY 180 tablet 0  . fluticasone (FLONASE) 50 MCG/ACT nasal spray SPRAY ONCE INTO EACH NOSTRIL DAILY. IN [place], SUMMER, AND FALL 48 g 3  . multivitamin (THERAGRAN) per tablet Take 1 tablet by mouth daily.      . pantoprazole (PROTONIX) 40 MG tablet TAKE 1 TABLET BY MOUTH EVERY DAY 90 tablet 3  . Probiotic Product (PROBIOTIC DAILY PO) Take by mouth daily.    Marland Kitchen venlafaxine XR (EFFEXOR-XR) 75 MG 24 hr capsule TAKE ONE CAPSULE BY MOUTH EVERY DAY WITH BREAKFAST 90 capsule 0   No current facility-administered medications for this visit.     Family History  Problem Relation Age of Onset  . Other Mother        CHF  . Diabetes Mother  Borderline  . Hypertension Mother   . Obesity Mother   . Anxiety disorder Mother   . Other Father        CHF  . Obesity Sister   . Diabetes Sister        type 2  . Hypertension Sister   . Anxiety disorder Sister   . Diverticulosis Sister   . Hyperlipidemia Sister   . Obesity Brother   . Anxiety disorder Brother   . Cancer Maternal Aunt        breast  . Cancer Paternal Aunt        Breast  . Obesity Paternal Grandmother   . Other Paternal Grandfather        Committed suicide during the [place] Depression  . Obesity Sister   . Anxiety disorder Sister   . Fibrocystic breast disease Sister   . Thyroid disease Sister   . Obesity Sister     Review of Systems  All other systems reviewed and are negative.   Exam:   BP (!) 152/86   Pulse 84   Temp 98.2 F (36.8 C) (Temporal)   Ht 5\' 1"  (1.549 m)   Wt 187 lb (84.8 kg)   LMP 03/02/2011 (Approximate)   BMI 35.33 kg/m     Height: 5\' 1"  (154.9 cm)  Ht Readings  from Last 3 Encounters:  06/04/19 5\' 1"  (1.549 m)  07/18/18 5\' 1"  (1.549 m)  04/04/18 5' 1.5" (1.562 m)    General appearance: alert, cooperative and appears stated age Head: Normocephalic, without obvious abnormality, atraumatic Neck: no adenopathy, supple, symmetrical, trachea midline and thyroid normal to inspection and palpation Lungs: clear to auscultation bilaterally Breasts: normal appearance, no masses or tenderness Heart: regular rate and rhythm Abdomen: soft, non-tender; bowel sounds normal; no masses,  no organomegaly Extremities: extremities normal, atraumatic, no cyanosis or edema Skin: Skin color, texture, turgor normal. No rashes or lesions Lymph nodes: Cervical, supraclavicular, and axillary nodes normal. No abnormal inguinal nodes palpated Neurologic: Grossly normal   Pelvic: External genitalia:  no lesions              Urethra:  normal appearing urethra with no masses, tenderness or lesions              Bartholins and Skenes: normal                 Vagina: normal appearing vagina with normal color and discharge, no lesions              Cervix: no lesions              Pap taken: Yes.   Bimanual Exam:  Uterus:  normal size, contour, position, consistency, mobility, non-tender              Adnexa: normal adnexa and no mass, fullness, tenderness               Rectovaginal: Confirms               Anus:  normal sphincter tone, no lesions  Chaperone was present for exam.  A:  Well Woman with normal exam PMP, no HRT H/o depression Stressors with husband's recent surgery Elevated BP today  P:   Mammogram guidelines reviewed.  Has this scheduled. pap smear with HR HPV obtained today Rx for Famvir 250mg  bid #180/4RF She is going to start checking her BPs at home and if >130/90, she will let me know Colonoscopy is due next year Return annually  or prn

## 2019-06-13 LAB — CYTOLOGY - PAP
Diagnosis: NEGATIVE
High risk HPV: NEGATIVE
Molecular Disclaimer: 56
Molecular Disclaimer: NORMAL

## 2019-07-05 ENCOUNTER — Ambulatory Visit
Admission: RE | Admit: 2019-07-05 | Discharge: 2019-07-05 | Disposition: A | Discharge status: Home / Self Care | Payer: Managed Care, Other (non HMO) | Source: Ambulatory Visit | Attending: Obstetrics & Gynecology | Admitting: Obstetrics & Gynecology

## 2019-07-05 ENCOUNTER — Other Ambulatory Visit: Payer: Self-pay

## 2019-07-05 DIAGNOSIS — Z1231 Encounter for screening mammogram for malignant neoplasm of breast: Secondary | ICD-10-CM

## 2019-07-08 ENCOUNTER — Other Ambulatory Visit: Payer: Self-pay | Admitting: Obstetrics & Gynecology

## 2019-07-08 DIAGNOSIS — R928 Other abnormal and inconclusive findings on diagnostic imaging of breast: Secondary | ICD-10-CM

## 2019-07-10 ENCOUNTER — Ambulatory Visit
Admission: RE | Admit: 2019-07-10 | Discharge: 2019-07-10 | Disposition: A | Discharge status: Home / Self Care | Payer: Managed Care, Other (non HMO) | Source: Ambulatory Visit | Attending: Obstetrics & Gynecology | Admitting: Obstetrics & Gynecology

## 2019-07-10 ENCOUNTER — Other Ambulatory Visit: Payer: Self-pay

## 2019-07-10 ENCOUNTER — Ambulatory Visit: Payer: Managed Care, Other (non HMO)

## 2019-07-10 DIAGNOSIS — R928 Other abnormal and inconclusive findings on diagnostic imaging of breast: Secondary | ICD-10-CM

## 2019-08-29 ENCOUNTER — Other Ambulatory Visit: Payer: Self-pay | Admitting: Family Medicine

## 2019-10-24 ENCOUNTER — Other Ambulatory Visit: Payer: Self-pay | Admitting: Family Medicine

## 2019-10-25 MED ORDER — VENLAFAXINE HCL ER 75 MG PO CP24: 75.0000 mg | ORAL_CAPSULE | Freq: Every day | ORAL | 0 refills | Status: DC

## 2019-10-25 NOTE — Telephone Encounter (Signed)
Breanna Bridges -- Can you schedule this pt a Virtual follow up with Dr Charlett Blake in the next 30 days?  She has not been seen by Dr Charlett Blake since 2018 and needs appt ASAP. Thank you!

## 2019-10-29 NOTE — Telephone Encounter (Signed)
LM for pt to call and schedule OV (virtual or in office ok)

## 2019-11-22 ENCOUNTER — Other Ambulatory Visit: Payer: Self-pay | Admitting: Family Medicine

## 2019-11-28 ENCOUNTER — Encounter: Payer: Self-pay | Admitting: Family Medicine

## 2019-11-29 ENCOUNTER — Other Ambulatory Visit: Payer: Self-pay | Admitting: Family Medicine

## 2019-11-29 ENCOUNTER — Ambulatory Visit (INDEPENDENT_AMBULATORY_CARE_PROVIDER_SITE_OTHER): Payer: Managed Care, Other (non HMO) | Admitting: Family Medicine

## 2019-11-29 ENCOUNTER — Other Ambulatory Visit: Payer: Self-pay

## 2019-11-29 VITALS — BP 125/65 | Wt 180.0 lb

## 2019-11-29 DIAGNOSIS — R030 Elevated blood-pressure reading, without diagnosis of hypertension: Secondary | ICD-10-CM

## 2019-11-29 DIAGNOSIS — Z1211 Encounter for screening for malignant neoplasm of colon: Secondary | ICD-10-CM

## 2019-11-29 DIAGNOSIS — B0229 Other postherpetic nervous system involvement: Secondary | ICD-10-CM

## 2019-11-29 DIAGNOSIS — E876 Hypokalemia: Secondary | ICD-10-CM

## 2019-11-29 DIAGNOSIS — R739 Hyperglycemia, unspecified: Secondary | ICD-10-CM | POA: Diagnosis not present

## 2019-11-29 DIAGNOSIS — D649 Anemia, unspecified: Secondary | ICD-10-CM

## 2019-11-29 DIAGNOSIS — E782 Mixed hyperlipidemia: Secondary | ICD-10-CM | POA: Diagnosis not present

## 2019-11-29 DIAGNOSIS — L309 Dermatitis, unspecified: Secondary | ICD-10-CM

## 2019-11-29 MED ORDER — VALACYCLOVIR HCL 500 MG PO TABS: ORAL_TABLET | ORAL | 2 refills | Status: DC

## 2019-11-29 NOTE — Patient Instructions (Signed)
Omron Blood Pressure cuff, upper arm, want BP 100-140/60-90 Pulse oximeter, want oxygen in 90s  Weekly vitals  Take Multivitamin with minerals, selenium Vitamin D 1000-2000 IU daily Probiotic with lactobacillus and bifidophilus Asprin EC 81 mg daily Fish or krill  Melatonin 2-5 mg at bedtime  [email or web address]

## 2019-11-29 NOTE — Assessment & Plan Note (Signed)
hgba1c acceptable, minimize simple carbs. Increase exercise as tolerated.  

## 2019-11-29 NOTE — Assessment & Plan Note (Signed)
Continues to have recurrent vesicular rash on left foot over past two years, painful and itching. Has broken through on Famvir will try switching to Valtrex 2 gm bid x 1 day then 500 mg daily and she is referred to Dermatology for further consideration.

## 2019-11-29 NOTE — Assessment & Plan Note (Signed)
Increase leafy greens, consider increased lean red meat and using cast iron cookware. Continue to monitor, report any concerns 

## 2019-11-29 NOTE — Assessment & Plan Note (Signed)
Monitor and report any concerns. Encouraged heart healthy diet such as the DASH diet and exercise as tolerated. 

## 2019-11-29 NOTE — Progress Notes (Signed)
Virtual Visit via Video Note  I connected with Breanna Bridges on 11/29/19 at  9:00 AM EDT by a video enabled telemedicine application and verified that I am speaking with the correct person using two identifiers.  Location: Patient: home Provider: home   I discussed the limitations of evaluation and management by telemedicine and the availability of in person appointments. The patient expressed understanding and agreed to proceed. Breanna Bridges, CMA was able to get the patient set up on a video visit   Subjective:    Patient ID: Breanna Bridges, female    DOB: 03/19/1956, 64 y.o.   MRN: [id number]  No chief complaint on file.   HPI Patient is in today for follow up on chronic medical concerns. She continues to have a recurrent vesicular rash that is uncomfortable and itching on left foot. No recent febrile illness or acute hospitalizations. No c/o polyuria or polydipsia. She has had her first covid shot and gets her second [place] shot on April 7. No trouble with the shot. Denies CP/palp/SOB/HA/congestion/fevers/GI or GU c/o. Taking meds as prescribed  Past Medical History:  Diagnosis Date  . Abnormal Pap smear    CIN I-LEEP  . Depression    h/o depression/anxiety  . Depression with anxiety 01/18/2015  . GERD (gastroesophageal reflux disease)   . HSV infection   . Hyperglycemia 01/31/2016  . Other and unspecified hyperlipidemia 05/20/2013  . Stress fracture 2008   left foot  . Trigger finger of left hand    ring finger    Past Surgical History:  Procedure Laterality Date  . ANKLE SURGERY Left 07/2016  . BREAST BIOPSY Left 2018  . FOOT SURGERY Left 04/15/14   left foot-bone spur, Dr. Sharol Given; second surgery 01/2015 to correct prior surgery  . FOOT SURGERY Left 5/16   Dr Beckey Downing at [facility]  . HAMMER TOE SURGERY  10+ yrs   left foot  . LEEP  15+ yrs   normal paps since then  . REFRACTIVE SURGERY  5 yrs ago   b/l    Family History  Problem Relation Age of Onset  . Other  Mother        CHF  . Diabetes Mother        Borderline  . Hypertension Mother   . Obesity Mother   . Anxiety disorder Mother   . Other Father        CHF  . Obesity Sister   . Diabetes Sister        type 2  . Hypertension Sister   . Anxiety disorder Sister   . Diverticulosis Sister   . Hyperlipidemia Sister   . Obesity Brother   . Anxiety disorder Brother   . Cancer Maternal Aunt        breast  . Cancer Paternal Aunt        Breast  . Obesity Paternal Grandmother   . Other Paternal Grandfather        Committed suicide during the [place] Depression  . Obesity Sister   . Anxiety disorder Sister   . Fibrocystic breast disease Sister   . Thyroid disease Sister   . Obesity Sister     Social History   Socioeconomic History  . Marital status: Married    Spouse name: Not on file  . Number of children: Not on file  . Years of education: Not on file  . Highest education level: Not on file  Occupational History  . Not on file  Tobacco  Use  . Smoking status: Former Smoker    Packs/day: 0.25    Quit date: 09/13/1983    Years since quitting: 36.2  . Smokeless tobacco: Never Used  Substance and Sexual Activity  . Alcohol use: Yes    Alcohol/week: 14.0 standard drinks    Types: 14 Glasses of wine per week  . Drug use: No  . Sexual activity: Yes    Partners: Male    Birth control/protection: None, Post-menopausal    Comment: no dietary restrictions, lives with husband, 4 dogs  Other Topics Concern  . Not on file  Social History Narrative   Lives with Breanna Bridges, husband, no major dietary restrictions, works in Pharmacologist, Presenter, broadcasting, Associate Professor   Social Determinants of Health   Financial Resource Strain:   . Difficulty of Paying Living Expenses:   Food Insecurity:   . Worried About Charity fundraiser in the Last Year:   . Arboriculturist in the Last Year:   Transportation Needs:   . Film/video editor (Medical):   Marland Kitchen Lack of Transportation (Non-Medical):   Physical  Activity:   . Days of Exercise per Week:   . Minutes of Exercise per Session:   Stress:   . Feeling of Stress :   Social Connections:   . Frequency of Communication with Friends and Family:   . Frequency of Social Gatherings with Friends and Family:   . Attends Religious Services:   . Active Member of Clubs or Organizations:   . Attends Archivist Meetings:   Marland Kitchen Marital Status:   Intimate Partner Violence:   . Fear of Current or Ex-Partner:   . Emotionally Abused:   Marland Kitchen Physically Abused:   . Sexually Abused:     Outpatient Medications Prior to Visit  Medication Sig Dispense Refill  . aspirin 81 MG tablet Take 81 mg by mouth daily.      . calcium carbonate 200 MG capsule Take 400 mg by mouth daily.      . cetirizine (ZYRTEC) 10 MG tablet Take 10 mg by mouth daily.      . fluticasone (FLONASE) 50 MCG/ACT nasal spray SPRAY ONCE INTO EACH NOSTRIL DAILY. IN [place], SUMMER, AND FALL 48 g 3  . multivitamin (THERAGRAN) per tablet Take 1 tablet by mouth daily.      . pantoprazole (PROTONIX) 40 MG tablet TAKE 1 TABLET BY MOUTH EVERY DAY 90 tablet 3  . valACYclovir (VALTREX) 500 MG tablet 4 tabs po bid x 24 hours then 1 tab po daily 40 tablet 2  . venlafaxine XR (EFFEXOR-XR) 75 MG 24 hr capsule TAKE ONE CAPSULE BY MOUTH EVERY DAY WITH BREAKFAST 90 capsule 1  . Probiotic Product (PROBIOTIC DAILY PO) Take by mouth daily.     No facility-administered medications prior to visit.    Allergies  Allergen Reactions  . Codeine     REACTION: nausea    Review of Systems  Constitutional: Negative for fever and malaise/fatigue.  HENT: Negative for congestion.   Eyes: Negative for blurred vision.  Respiratory: Negative for shortness of breath.   Cardiovascular: Negative for chest pain, palpitations and leg swelling.  Gastrointestinal: Negative for abdominal pain, blood in stool and nausea.  Genitourinary: Negative for dysuria and frequency.  Musculoskeletal: Negative for falls.  Skin:  Positive for itching and rash.  Neurological: Negative for dizziness, loss of consciousness and headaches.  Endo/Heme/Allergies: Negative for environmental allergies.  Psychiatric/Behavioral: Negative for depression. The patient is not nervous/anxious.  Objective:    Physical Exam Constitutional:      Appearance: Normal appearance. She is not ill-appearing.  HENT:     Head: Normocephalic and atraumatic.     Nose: Nose normal.  Eyes:     General:        Right eye: No discharge.        Left eye: No discharge.  Pulmonary:     Effort: Pulmonary effort is normal.  Skin:    Findings: Rash present.     Comments: Linear with scabs near left medial malleolus erythema at base  Neurological:     Mental Status: She is alert and oriented to person, place, and time.  Psychiatric:        Behavior: Behavior normal.     BP 125/65   Wt 180 lb (81.6 kg)   LMP 03/02/2011 (Approximate)   BMI 34.01 kg/m  Wt Readings from Last 3 Encounters:  11/29/19 180 lb (81.6 kg)  06/04/19 187 lb (84.8 kg)  07/18/18 182 lb (82.6 kg)    Diabetic Foot Exam - Simple   No data filed     Lab Results  Component Value Date   WBC 8.1 07/18/2018   HGB 11.8 (L) 07/18/2018   HCT 34.5 (L) 07/18/2018   PLT 223.0 07/18/2018   GLUCOSE 104 (H) 07/18/2018   CHOL 244 (H) 06/15/2017   TRIG 81.0 06/15/2017   HDL 99.40 06/15/2017   LDLCALC 129 (H) 06/15/2017   ALT 32 07/18/2018   AST 27 07/18/2018   NA 135 07/18/2018   K 3.3 (L) 07/18/2018   CL 99 07/18/2018   CREATININE 0.64 07/18/2018   BUN 13 07/18/2018   CO2 24 07/18/2018   TSH 1.54 06/15/2017   HGBA1C 5.4 04/04/2018    Lab Results  Component Value Date   TSH 1.54 06/15/2017   Lab Results  Component Value Date   WBC 8.1 07/18/2018   HGB 11.8 (L) 07/18/2018   HCT 34.5 (L) 07/18/2018   MCV 87.7 07/18/2018   PLT 223.0 07/18/2018   Lab Results  Component Value Date   NA 135 07/18/2018   K 3.3 (L) 07/18/2018   CO2 24 07/18/2018    GLUCOSE 104 (H) 07/18/2018   BUN 13 07/18/2018   CREATININE 0.64 07/18/2018   BILITOT 0.5 07/18/2018   ALKPHOS 83 07/18/2018   AST 27 07/18/2018   ALT 32 07/18/2018   PROT 6.8 07/18/2018   ALBUMIN 4.1 07/18/2018   CALCIUM 9.2 07/18/2018   GFR 99.89 07/18/2018   Lab Results  Component Value Date   CHOL 244 (H) 06/15/2017   Lab Results  Component Value Date   HDL 99.40 06/15/2017   Lab Results  Component Value Date   LDLCALC 129 (H) 06/15/2017   Lab Results  Component Value Date   TRIG 81.0 06/15/2017   Lab Results  Component Value Date   CHOLHDL 2 06/15/2017   Lab Results  Component Value Date   HGBA1C 5.4 04/04/2018       Assessment & Plan:   Problem List Items Addressed This Visit    UNSPECIFIED ANEMIA - Primary    Increase leafy greens, consider increased lean red meat and using cast iron cookware. Continue to monitor, report any concerns      Relevant Orders   CBC with Differential/Platelet   ELEVATED BP READING WITHOUT DX HYPERTENSION    Monitor and report any concerns. Encouraged heart healthy diet such as the DASH diet and exercise as tolerated.  Relevant Orders   TSH   Hyperlipidemia, mixed    Encouraged heart healthy diet, increase exercise, avoid trans fats, consider a krill oil cap daily      Relevant Orders   Lipid panel   Hyperglycemia    hgba1c acceptable, minimize simple carbs. Increase exercise as tolerated.       Relevant Orders   Hemoglobin A1c   TSH   Post herpetic neuralgia    Continues to have recurrent vesicular rash on left foot over past two years, painful and itching. Has broken through on Famvir will try switching to Valtrex 2 gm bid x 1 day then 500 mg daily and she is referred to Dermatology for further consideration.       Other Visit Diagnoses    Hypokalemia       Relevant Orders   Comprehensive metabolic panel      I have discontinued Adysson C. Waldrip's Probiotic Product (PROBIOTIC DAILY PO). I am also  having her maintain her aspirin, calcium carbonate, multivitamin, cetirizine, fluticasone, pantoprazole, venlafaxine XR, and valACYclovir.  No orders of the defined types were placed in this encounter.    I discussed the assessment and treatment plan with the patient. The patient was provided an opportunity to ask questions and all were answered. The patient agreed with the plan and demonstrated an understanding of the instructions.   The patient was advised to call back or seek an in-person evaluation if the symptoms worsen or if the condition fails to improve as anticipated.  I provided 30 minutes of non-face-to-face time during this encounter.   Penni Homans, MD

## 2019-11-29 NOTE — Assessment & Plan Note (Signed)
Encouraged heart healthy diet, increase exercise, avoid trans fats, consider a krill oil cap daily 

## 2019-12-02 ENCOUNTER — Encounter: Payer: Self-pay | Admitting: Gastroenterology

## 2019-12-03 ENCOUNTER — Other Ambulatory Visit (INDEPENDENT_AMBULATORY_CARE_PROVIDER_SITE_OTHER): Payer: Managed Care, Other (non HMO)

## 2019-12-03 ENCOUNTER — Other Ambulatory Visit: Payer: Self-pay

## 2019-12-03 ENCOUNTER — Encounter: Payer: Self-pay | Admitting: Family Medicine

## 2019-12-03 DIAGNOSIS — E876 Hypokalemia: Secondary | ICD-10-CM

## 2019-12-03 DIAGNOSIS — R739 Hyperglycemia, unspecified: Secondary | ICD-10-CM

## 2019-12-03 DIAGNOSIS — R030 Elevated blood-pressure reading, without diagnosis of hypertension: Secondary | ICD-10-CM | POA: Diagnosis not present

## 2019-12-03 DIAGNOSIS — D649 Anemia, unspecified: Secondary | ICD-10-CM

## 2019-12-03 DIAGNOSIS — E782 Mixed hyperlipidemia: Secondary | ICD-10-CM

## 2019-12-03 LAB — LIPID PANEL
Cholesterol: 221 mg/dL — ABNORMAL HIGH (ref 0–200)
HDL: 93.8 mg/dL (ref >39.00)
LDL Cholesterol: 114 mg/dL — ABNORMAL HIGH (ref 0–99)
NonHDL: 127.6
Total CHOL/HDL Ratio: 2
Triglycerides: 70 mg/dL (ref 0.0–149.0)
VLDL: 14 mg/dL (ref 0.0–40.0)

## 2019-12-03 LAB — CBC WITH DIFFERENTIAL/PLATELET
Basophils Absolute: 0 10*3/uL (ref 0.0–0.1)
Basophils Relative: 0.8 % (ref 0.0–3.0)
Eosinophils Absolute: 0.1 10*3/uL (ref 0.0–0.7)
Eosinophils Relative: 2.9 % (ref 0.0–5.0)
HCT: 37.4 % (ref 36.0–46.0)
Hemoglobin: 13 g/dL (ref 12.0–15.0)
Lymphocytes Relative: 22.1 % (ref 12.0–46.0)
Lymphs Abs: 0.9 10*3/uL (ref 0.7–4.0)
MCHC: 34.7 g/dL (ref 30.0–36.0)
MCV: 90.2 fl (ref 78.0–100.0)
Monocytes Absolute: 0.4 10*3/uL (ref 0.1–1.0)
Monocytes Relative: 9.5 % (ref 3.0–12.0)
Neutro Abs: 2.6 10*3/uL (ref 1.4–7.7)
Neutrophils Relative %: 64.7 % (ref 43.0–77.0)
Platelets: 231 10*3/uL (ref 150.0–400.0)
RBC: 4.15 Mil/uL (ref 3.87–5.11)
RDW: 13.7 % (ref 11.5–15.5)
WBC: 4.1 10*3/uL (ref 4.0–10.5)

## 2019-12-03 LAB — HEMOGLOBIN A1C: Hgb A1c MFr Bld: 5.1 % (ref 4.6–6.5)

## 2019-12-03 LAB — COMPREHENSIVE METABOLIC PANEL
ALT: 12 U/L (ref 0–35)
AST: 14 U/L (ref 0–37)
Albumin: 4.4 g/dL (ref 3.5–5.2)
Alkaline Phosphatase: 88 U/L (ref 39–117)
BUN: 18 mg/dL (ref 6–23)
CO2: 27 mEq/L (ref 19–32)
Calcium: 9.5 mg/dL (ref 8.4–10.5)
Chloride: 105 mEq/L (ref 96–112)
Creatinine, Ser: 0.69 mg/dL (ref 0.40–1.20)
GFR: 85.78 mL/min (ref >60.00)
Glucose, Bld: 99 mg/dL (ref 70–99)
Potassium: 4.1 mEq/L (ref 3.5–5.1)
Sodium: 141 mEq/L (ref 135–145)
Total Bilirubin: 0.5 mg/dL (ref 0.2–1.2)
Total Protein: 6.7 g/dL (ref 6.0–8.3)

## 2019-12-03 LAB — TSH: TSH: 4.92 u[IU]/mL — ABNORMAL HIGH (ref 0.35–4.50)

## 2019-12-04 ENCOUNTER — Other Ambulatory Visit (INDEPENDENT_AMBULATORY_CARE_PROVIDER_SITE_OTHER): Payer: Managed Care, Other (non HMO)

## 2019-12-04 DIAGNOSIS — R7989 Other specified abnormal findings of blood chemistry: Secondary | ICD-10-CM | POA: Diagnosis not present

## 2019-12-04 LAB — T4, FREE: Free T4: 0.82 ng/dL (ref 0.60–1.60)

## 2019-12-26 ENCOUNTER — Other Ambulatory Visit: Payer: Self-pay

## 2019-12-26 ENCOUNTER — Ambulatory Visit (AMBULATORY_SURGERY_CENTER): Payer: Self-pay | Admitting: *Deleted

## 2019-12-26 VITALS — Temp 96.9°F | Ht 61.0 in | Wt 183.6 lb

## 2019-12-26 DIAGNOSIS — Z1211 Encounter for screening for malignant neoplasm of colon: Secondary | ICD-10-CM

## 2019-12-26 MED ORDER — SUPREP BOWEL PREP KIT 17.5-3.13-1.6 GM/177ML PO SOLN: 1.0000 | Freq: Once | ORAL | 0 refills | Status: AC

## 2019-12-26 NOTE — Progress Notes (Signed)
Completed covid vaccines 12-18-2019  No egg or soy allergy known to patient  No issues with past sedation with any surgeries  or procedures, no intubation problems  No diet pills per patient No home 02 use per patient  No blood thinners per patient  Pt denies issues with constipation  No A fib or A flutter  EMMI video sent to pt's e mail   Per Epic appt notes it states 2 day Suprep per MD- but pt has soft regular BM's with no hx of constipation- last colon prep good per report- but will do 2 day SUprep per note........Marland Kitchen   Due to the COVID-19 pandemic we are asking patients to follow these guidelines. Please only bring one care partner. Please be aware that your care partner may wait in the car in the parking lot or if they feel like they will be too hot to wait in the car, they may wait in the lobby on the 4th floor. All care partners are required to wear a mask the entire time (we do not have any that we can provide them), they need to practice social distancing, and we will do a Covid check for all patient's and care partners when you arrive. Also we will check their temperature and your temperature. If the care partner waits in their car they need to stay in the parking lot the entire time and we will call them on their cell phone when the patient is ready for discharge so they can bring the car to the front of the building. Also all patient's will need to wear a mask into building.

## 2020-01-07 ENCOUNTER — Encounter: Payer: Self-pay | Admitting: Gastroenterology

## 2020-01-09 ENCOUNTER — Encounter: Payer: Self-pay | Admitting: Gastroenterology

## 2020-01-09 ENCOUNTER — Other Ambulatory Visit: Payer: Self-pay

## 2020-01-09 ENCOUNTER — Ambulatory Visit (AMBULATORY_SURGERY_CENTER): Payer: Managed Care, Other (non HMO) | Admitting: Gastroenterology

## 2020-01-09 VITALS — BP 132/76 | HR 68 | Temp 97.7°F | Resp 12 | Ht 61.0 in | Wt 183.0 lb

## 2020-01-09 DIAGNOSIS — K635 Polyp of colon: Secondary | ICD-10-CM | POA: Diagnosis not present

## 2020-01-09 DIAGNOSIS — D122 Benign neoplasm of ascending colon: Secondary | ICD-10-CM

## 2020-01-09 DIAGNOSIS — Z1211 Encounter for screening for malignant neoplasm of colon: Secondary | ICD-10-CM | POA: Diagnosis present

## 2020-01-09 MED ORDER — SODIUM CHLORIDE 0.9 % IV SOLN: 500.0000 mL | INTRAVENOUS | Status: DC

## 2020-01-09 NOTE — Op Note (Signed)
[facility] Patient Name: Breanna Bridges Procedure Date: 01/09/2020 8:39 AM MRN: [id number] Endoscopist: Thornton Park MD, MD Age: 64 Referring MD:  Date of Birth: 05-08-1956 Gender: Female Account #: [id number] Procedure:                Colonoscopy Indications:              Screening for colorectal malignant neoplasm                           Normal screening colonoscopy with Dr. Olevia Perches 2011                           Father with colon polyps in his 42s                           No other known family history of colon cancer or                            polyps Medicines:                Monitored Anesthesia Care Procedure:                Pre-Anesthesia Assessment:                           - Prior to the procedure, a History and Physical                            was performed, and patient medications and                            allergies were reviewed. The patient's tolerance of                            previous anesthesia was also reviewed. The risks                            and benefits of the procedure and the sedation                            options and risks were discussed with the patient.                            All questions were answered, and informed consent                            was obtained. Prior Anticoagulants: The patient has                            taken no previous anticoagulant or antiplatelet                            agents. ASA Grade Assessment: II - A patient with  mild systemic disease. After reviewing the risks                            and benefits, the patient was deemed in                            satisfactory condition to undergo the procedure.                           After obtaining informed consent, the colonoscope                            was passed under direct vision. Throughout the                            procedure, the patient's blood pressure, pulse, and       oxygen saturations were monitored continuously. The                            Colonoscope was introduced through the anus and                            advanced to the 3 cm into the ileum. A second                            forward view of the right colon was performed. The                            colonoscopy was performed without difficulty. The                            patient tolerated the procedure well. The quality                            of the bowel preparation was good. The terminal                            ileum, ileocecal valve, appendiceal orifice, and                            rectum were photographed. Scope In: [id number] AM Scope Out: 9:01:04 AM Scope Withdrawal Time: 0 hours 11 minutes 33 seconds  Total Procedure Duration: 0 hours 14 minutes 45 seconds  Findings:                 The perianal and digital rectal examinations were                            normal.                           Non-bleeding internal hemorrhoids were found. The                            hemorrhoids were small.  A 6 mm polyp was found in the proximal ascending                            colon. The polyp was flat. The polyp was removed                            with a piecemeal technique using a cold snare.                            Resection and retrieval were complete. Estimated                            blood loss was minimal.                           A few small-mouthed diverticula were found in the                            sigmoid colon.                           The exam was otherwise without abnormality on                            direct and retroflexion views. Complications:            No immediate complications. Estimated blood loss:                            Minimal. Estimated Blood Loss:     Estimated blood loss was minimal. Impression:               - Non-bleeding internal hemorrhoids.                           - One 6 mm polyp in the  proximal ascending colon,                            removed piecemeal using a cold snare. Resected and                            retrieved.                           - Diverticulosis in the sigmoid colon.                           - The examination was otherwise normal on direct                            and retroflexion views. Recommendation:           - Patient has a contact number available for                            emergencies. The signs and symptoms of potential  delayed complications were discussed with the                            patient. Return to normal activities tomorrow.                            Written discharge instructions were provided to the                            patient.                           - Follow a high fiber diet. Drink at least 64                            ounces of water daily. Add a daily stool bulking                            agent such as psyllium (an exampled would be                            Metamucil).                           - Continue present medications.                           - Await pathology results.                           - Repeat colonoscopy date to be determined after                            pending pathology results are reviewed for                            surveillance.                           - Emerging evidence supports eating a diet of                            fruits, vegetables, grains, calcium, and yogurt                            while reducing red meat and alcohol may reduce the                            risk of colon cancer.                           - Thank you for allowing me to be involved in your                            colon cancer prevention. Thornton Park MD, MD 01/09/2020 9:06:42 AM This  report has been signed electronically.

## 2020-01-09 NOTE — Progress Notes (Signed)
To PACU, VSS. Report to [email or web address] 

## 2020-01-09 NOTE — Patient Instructions (Signed)
YOU HAD AN ENDOSCOPIC PROCEDURE TODAY AT [facility]:   Refer to the procedure report that was given to you for any specific questions about what was found during the examination.  If the procedure report does not answer your questions, please call your gastroenterologist to clarify.  If you requested that your care partner not be given the details of your procedure findings, then the procedure report has been included in a sealed envelope for you to review at your convenience later.  YOU SHOULD EXPECT: Some feelings of bloating in the abdomen. Passage of more gas than usual.  Walking can help get rid of the air that was put into your GI tract during the procedure and reduce the bloating. If you had a lower endoscopy (such as a colonoscopy or flexible sigmoidoscopy) you may notice spotting of blood in your stool or on the toilet paper. If you underwent a bowel prep for your procedure, you may not have a normal bowel movement for a few days.  Please Note:  You might notice some irritation and congestion in your nose or some drainage.  This is from the oxygen used during your procedure.  There is no need for concern and it should clear up in a day or so.  SYMPTOMS TO REPORT IMMEDIATELY:   Following lower endoscopy (colonoscopy or flexible sigmoidoscopy):  Excessive amounts of blood in the stool  Significant tenderness or worsening of abdominal pains  Swelling of the abdomen that is new, acute  Fever of 100F or higher   For urgent or emergent issues, a gastroenterologist can be reached at any hour by calling [phone]. Do not use MyChart messaging for urgent concerns.    DIET:  We do recommend a small meal at first, but then you may proceed to your regular diet.  Drink plenty of fluids but you should avoid alcoholic beverages for 24 hours. Follow a High Fiber Diet. Drink at least 64 ounces of water daily. Add a stool bulking agent such as psyllium, for example,  Metamucil.  MEDICATIONS: Continue present medications.  Please see handouts given to you by your recovery nurse.    ACTIVITY:  You should plan to take it easy for the rest of today and you should NOT DRIVE or use heavy machinery until tomorrow (because of the sedation medicines used during the test).    FOLLOW UP: Our staff will call the number listed on your records 48-72 hours following your procedure to check on you and address any questions or concerns that you may have regarding the information given to you following your procedure. If we do not reach you, we will leave a message.  We will attempt to reach you two times.  During this call, we will ask if you have developed any symptoms of COVID 19. If you develop any symptoms (ie: fever, flu-like symptoms, shortness of breath, cough etc.) before then, please call [phone].  If you test positive for Covid 19 in the 2 weeks post procedure, please call and report this information to [place].    If any biopsies were taken you will be contacted by phone or by letter within the next 1-3 weeks.  Please call us at [phone] if you have not heard about the biopsies in 3 weeks.   Thank you for allowing [place] to provide for your healthcare needs today.   SIGNATURES/CONFIDENTIALITY: You and/or your care partner have signed paperwork which will be entered into your electronic medical record.  These  signatures attest to the fact that that the information above on your After Visit Summary has been reviewed and is understood.  Full responsibility of the confidentiality of this discharge information lies with you and/or your care-partner. 

## 2020-01-09 NOTE — Progress Notes (Signed)
Temp JB V/s CW I have reviewed the patient's medical history in detail and updated the computerized patient record. 

## 2020-01-09 NOTE — Progress Notes (Signed)
Called to room to assist during endoscopic procedure.  Patient ID and intended procedure confirmed with present staff. Received instructions for my participation in the procedure from the performing physician.  

## 2020-01-13 ENCOUNTER — Telehealth: Payer: Self-pay | Admitting: *Deleted

## 2020-01-13 NOTE — Telephone Encounter (Signed)
  Follow up Call-  Call back number 01/09/2020  Post procedure Call Back phone  # [phone]  Permission to leave phone message Yes  Some recent data might be hidden     Patient questions:  Do you have a fever, pain , or abdominal swelling? No. Pain Score  0 *  Have you tolerated food without any problems? Yes.    Have you been able to return to your normal activities? Yes.    Do you have any questions about your discharge instructions: Diet   No. Medications  No. Follow up visit  No.  Do you have questions or concerns about your Care? No.  Actions: * If pain score is 4 or above: No action needed, pain <4.  1. Have you developed a fever since your procedure? no  2.   Have you had an respiratory symptoms (SOB or cough) since your procedure? no  3.   Have you tested positive for COVID 19 since your procedure no  4.   Have you had any family members/close contacts diagnosed with the COVID 19 since your procedure?  no   If yes to any of these questions please route to Joylene John, RN and Erenest Rasher, RN

## 2020-01-14 ENCOUNTER — Encounter: Payer: Self-pay | Admitting: Gastroenterology

## 2020-02-14 ENCOUNTER — Other Ambulatory Visit: Payer: Self-pay | Admitting: Family Medicine

## 2020-03-06 ENCOUNTER — Other Ambulatory Visit: Payer: Self-pay | Admitting: Family Medicine

## 2020-05-21 ENCOUNTER — Other Ambulatory Visit: Payer: Self-pay | Admitting: Family Medicine

## 2020-06-02 ENCOUNTER — Encounter: Payer: Self-pay | Admitting: Family Medicine

## 2020-06-04 ENCOUNTER — Other Ambulatory Visit: Payer: Self-pay

## 2020-06-04 ENCOUNTER — Ambulatory Visit (INDEPENDENT_AMBULATORY_CARE_PROVIDER_SITE_OTHER): Payer: Managed Care, Other (non HMO) | Admitting: Family Medicine

## 2020-06-04 ENCOUNTER — Encounter: Payer: Self-pay | Admitting: Family Medicine

## 2020-06-04 VITALS — BP 120/79 | HR 81 | Temp 98.3°F | Resp 12 | Ht 61.0 in | Wt 183.4 lb

## 2020-06-04 DIAGNOSIS — E782 Mixed hyperlipidemia: Secondary | ICD-10-CM | POA: Diagnosis not present

## 2020-06-04 DIAGNOSIS — R7989 Other specified abnormal findings of blood chemistry: Secondary | ICD-10-CM

## 2020-06-04 DIAGNOSIS — Z0000 Encounter for general adult medical examination without abnormal findings: Secondary | ICD-10-CM | POA: Diagnosis not present

## 2020-06-04 DIAGNOSIS — D649 Anemia, unspecified: Secondary | ICD-10-CM

## 2020-06-04 DIAGNOSIS — Z23 Encounter for immunization: Secondary | ICD-10-CM | POA: Diagnosis not present

## 2020-06-04 DIAGNOSIS — R739 Hyperglycemia, unspecified: Secondary | ICD-10-CM | POA: Diagnosis not present

## 2020-06-04 NOTE — Patient Instructions (Signed)
MIND diet  Preventive Care 2-64 Years Old, Female Preventive care refers to visits with your health care provider and lifestyle choices that can promote health and wellness. This includes:  A yearly physical exam. This may also be called an annual well check.  Regular dental visits and eye exams.  Immunizations.  Screening for certain conditions.  Healthy lifestyle choices, such as eating a healthy diet, getting regular exercise, not using drugs or products that contain nicotine and tobacco, and limiting alcohol use. What can I expect for my preventive care visit? Physical exam Your health care provider will check your:  Height and weight. This may be used to calculate body mass index (BMI), which tells if you are at a healthy weight.  Heart rate and blood pressure.  Skin for abnormal spots. Counseling Your health care provider may ask you questions about your:  Alcohol, tobacco, and drug use.  Emotional well-being.  Home and relationship well-being.  Sexual activity.  Eating habits.  Work and work Statistician.  Method of birth control.  Menstrual cycle.  Pregnancy history. What immunizations do I need?  Influenza (flu) vaccine  This is recommended every year. Tetanus, diphtheria, and pertussis (Tdap) vaccine  You may need a Td booster every 10 years. Varicella (chickenpox) vaccine  You may need this if you have not been vaccinated. Zoster (shingles) vaccine  You may need this after age 30. Measles, mumps, and rubella (MMR) vaccine  You may need at least one dose of MMR if you were born in 1957 or later. You may also need a second dose. Pneumococcal conjugate (PCV13) vaccine  You may need this if you have certain conditions and were not previously vaccinated. Pneumococcal polysaccharide (PPSV23) vaccine  You may need one or two doses if you smoke cigarettes or if you have certain conditions. Meningococcal conjugate (MenACWY) vaccine  You may need  this if you have certain conditions. Hepatitis A vaccine  You may need this if you have certain conditions or if you travel or work in places where you may be exposed to hepatitis A. Hepatitis B vaccine  You may need this if you have certain conditions or if you travel or work in places where you may be exposed to hepatitis B. Haemophilus influenzae type b (Hib) vaccine  You may need this if you have certain conditions. Human papillomavirus (HPV) vaccine  If recommended by your health care provider, you may need three doses over 6 months. You may receive vaccines as individual doses or as more than one vaccine together in one shot (combination vaccines). Talk with your health care provider about the risks and benefits of combination vaccines. What tests do I need? Blood tests  Lipid and cholesterol levels. These may be checked every 5 years, or more frequently if you are over 67 years old.  Hepatitis C test.  Hepatitis B test. Screening  Lung cancer screening. You may have this screening every year starting at age 38 if you have a 30-pack-year history of smoking and currently smoke or have quit within the past 15 years.  Colorectal cancer screening. All adults should have this screening starting at age 85 and continuing until age 105. Your health care provider may recommend screening at age 63 if you are at increased risk. You will have tests every 1-10 years, depending on your results and the type of screening test.  Diabetes screening. This is done by checking your blood sugar (glucose) after you have not eaten for a while (fasting). You  may have this done every 1-3 years.  Mammogram. This may be done every 1-2 years. Talk with your health care provider about when you should start having regular mammograms. This may depend on whether you have a family history of breast cancer.  BRCA-related cancer screening. This may be done if you have a family history of breast, ovarian, tubal, or  peritoneal cancers.  Pelvic exam and Pap test. This may be done every 3 years starting at age 41. Starting at age 28, this may be done every 5 years if you have a Pap test in combination with an HPV test. Other tests  Sexually transmitted disease (STD) testing.  Bone density scan. This is done to screen for osteoporosis. You may have this scan if you are at high risk for osteoporosis. Follow these instructions at home: Eating and drinking  Eat a diet that includes fresh fruits and vegetables, whole grains, lean protein, and low-fat dairy.  Take vitamin and mineral supplements as recommended by your health care provider.  Do not drink alcohol if: ? Your health care provider tells you not to drink. ? You are pregnant, may be pregnant, or are planning to become pregnant.  If you drink alcohol: ? Limit how much you have to 0-1 drink a day. ? Be aware of how much alcohol is in your drink. In the [place] one drink equals one 12 oz bottle of beer (355 mL), one 5 oz glass of wine (148 mL), or one 1 oz glass of hard liquor (44 mL). Lifestyle  Take daily care of your teeth and gums.  Stay active. Exercise for at least 30 minutes on 5 or more days each week.  Do not use any products that contain nicotine or tobacco, such as cigarettes, e-cigarettes, and chewing tobacco. If you need help quitting, ask your health care provider.  If you are sexually active, practice safe sex. Use a condom or other form of birth control (contraception) in order to prevent pregnancy and STIs (sexually transmitted infections).  If told by your health care provider, take low-dose aspirin daily starting at age 54. What's next?  Visit your health care provider once a year for a well check visit.  Ask your health care provider how often you should have your eyes and teeth checked.  Stay up to date on all vaccines. This information is not intended to replace advice given to you by your health care provider. Make  sure you discuss any questions you have with your health care provider. Document Revised: 05/10/2018 Document Reviewed: 05/10/2018 Elsevier Patient Education  2020 [employer].

## 2020-06-04 NOTE — Assessment & Plan Note (Addendum)
Patient encouraged to maintain heart healthy diet, regular exercise, adequate sleep. Consider daily probiotics. Take medications as prescribed. Dexa scan normal in 2019 repeat in next 3 years. Follows with GYN for pap and MGM, October 2020 repeat MGM after October 2021,

## 2020-06-05 ENCOUNTER — Other Ambulatory Visit: Payer: Managed Care, Other (non HMO)

## 2020-06-06 DIAGNOSIS — R7989 Other specified abnormal findings of blood chemistry: Secondary | ICD-10-CM | POA: Insufficient documentation

## 2020-06-06 NOTE — Assessment & Plan Note (Signed)
Mildly elevated on last blood draw but free T4 normal, repeat labs

## 2020-06-06 NOTE — Progress Notes (Signed)
Subjective:    Patient ID: Breanna Bridges, female    DOB: 1956/06/12, 64 y.o.   MRN: [id number]  Chief Complaint  Patient presents with  . Annual Exam    Flu Shot Requested    HPI Patient is in today for annual preventative exam and follow up on chronic medical concerns. No recent febrile illness or hospitalizations. She is doing well and tolerated her COVID shots. She continues to maintain social distancing and masking. She is maintaining a heart healthy diet and stays active. No c/o polyuria or polydipsia. Denies CP/palp/SOB/HA/congestion/fevers/GI or GU c/o. Taking meds as prescribed  Past Medical History:  Diagnosis Date  . Abnormal Pap smear    CIN I-LEEP  . Allergy   . Anemia    borderline   . Arthritis    back   . Depression    h/o depression/anxiety  . Depression with anxiety 01/18/2015  . GERD (gastroesophageal reflux disease)   . HSV infection   . Hyperglycemia 01/31/2016  . Other and unspecified hyperlipidemia 05/20/2013  . Stress fracture 2008   left foot  . Trigger finger of left hand    ring finger    Past Surgical History:  Procedure Laterality Date  . ANKLE SURGERY Left 07/2016  . BREAST BIOPSY Left 2018  . COLONOSCOPY     last 2011 DB normal   . FOOT SURGERY Left 04/15/14   left foot-bone spur, Dr. Sharol Given; second surgery 01/2015 to correct prior surgery  . FOOT SURGERY Left 5/16   Dr Beckey Downing at [facility]  . HAMMER TOE SURGERY  10+ yrs   left foot  . LEEP  15+ yrs   normal paps since then  . REFRACTIVE SURGERY  5 yrs ago   b/l  . WISDOM TOOTH EXTRACTION      Family History  Problem Relation Age of Onset  . Other Mother        CHF  . Diabetes Mother        Borderline  . Hypertension Mother   . Obesity Mother   . Anxiety disorder Mother   . Other Father        CHF  . Obesity Sister   . Diabetes Sister        type 2  . Hypertension Sister   . Anxiety disorder Sister   . Diverticulosis Sister   . Hyperlipidemia Sister   . Obesity Brother   .  Anxiety disorder Brother   . Cancer Maternal Aunt        breast  . Cancer Paternal Aunt        Breast  . Obesity Paternal Grandmother   . Other Paternal Grandfather        Committed suicide during the [place] Depression  . Obesity Sister   . Anxiety disorder Sister   . Fibrocystic breast disease Sister   . Thyroid disease Sister   . Colon polyps Sister   . Obesity Sister   . Cancer Cousin        recurrent metastatic, 10 year span  . Colon cancer Neg Hx   . Esophageal cancer Neg Hx   . Rectal cancer Neg Hx   . Stomach cancer Neg Hx     Social History   Socioeconomic History  . Marital status: Married    Spouse name: Not on file  . Number of children: Not on file  . Years of education: Not on file  . Highest education level: Not on file  Occupational History  .  Not on file  Tobacco Use  . Smoking status: Former Smoker    Packs/day: 0.25    Quit date: 09/13/1983    Years since quitting: 36.7  . Smokeless tobacco: Never Used  Vaping Use  . Vaping Use: Never used  Substance and Sexual Activity  . Alcohol use: Yes    Alcohol/week: 14.0 standard drinks    Types: 14 Glasses of wine per week  . Drug use: No  . Sexual activity: Yes    Partners: Male    Birth control/protection: None, Post-menopausal    Comment: no dietary restrictions, lives with husband, 4 dogs  Other Topics Concern  . Not on file  Social History Narrative   Lives with [place], husband, no major dietary restrictions, works in Pharmacologist, Presenter, broadcasting, [employer]   Social Determinants of Health   Financial Resource Strain:   . Difficulty of Paying Living Expenses: Not on file  Food Insecurity:   . Worried About Charity fundraiser in the Last Year: Not on file  . Ran Out of Food in the Last Year: Not on file  Transportation Needs:   . Lack of Transportation (Medical): Not on file  . Lack of Transportation (Non-Medical): Not on file  Physical Activity:   . Days of Exercise per Week: Not on file  .  Minutes of Exercise per Session: Not on file  Stress:   . Feeling of Stress : Not on file  Social Connections:   . Frequency of Communication with Friends and Family: Not on file  . Frequency of Social Gatherings with Friends and Family: Not on file  . Attends Religious Services: Not on file  . Active Member of Clubs or Organizations: Not on file  . Attends Archivist Meetings: Not on file  . Marital Status: Not on file  Intimate Partner Violence:   . Fear of Current or Ex-Partner: Not on file  . Emotionally Abused: Not on file  . Physically Abused: Not on file  . Sexually Abused: Not on file    Outpatient Medications Prior to Visit  Medication Sig Dispense Refill  . aspirin 81 MG tablet Take 81 mg by mouth daily.      . Calcium Carb-Cholecalciferol (CALCIUM 500 + D3 PO) Take by mouth.    . cetirizine (ZYRTEC) 10 MG tablet Take 10 mg by mouth daily.      . clobetasol ointment (TEMOVATE) 0.05 % as needed.     . famciclovir (FAMVIR) 250 MG tablet Take 500 mg by mouth 2 (two) times daily.    . fluticasone (FLONASE) 50 MCG/ACT nasal spray SPRAY ONCE INTO EACH NOSTRIL DAILY. IN [place], SUM[place], AND FALL 48 mL 3  . Krill Oil 500 MG CAPS Take by mouth once.    . multivitamin (THERAGRAN) per tablet Take 1 tablet by mouth daily.      . pantoprazole (PROTONIX) 40 MG tablet Take 1 tablet (40 mg total) by mouth daily. 90 tablet 3  . Probiotic Product (PROBIOTIC PO) Take by mouth daily.    Marland Kitchen venlafaxine XR (EFFEXOR-XR) 75 MG 24 hr capsule TAKE ONE CAPSULE BY MOUTH EVERY DAY WITH BREAKFAST 90 capsule 1  . calcium carbonate 200 MG capsule Take 400 mg by mouth daily.      Manus Gunning BOWEL PREP KIT 17.5-3.13-1.6 GM/177ML SOLN TAKE 1 KIT BY MOUTH ONCE FOR 1 DOSE. SUPREP AS DIRECTED. NO SUBSTITUTIONS    . valACYclovir (VALTREX) 500 MG tablet 4 tabs po bid x 24 hours then  1 tab po daily 40 tablet 2  . 0.9 %  sodium chloride infusion      No facility-administered medications prior to visit.     Allergies  Allergen Reactions  . Codeine     REACTION: nausea    Review of Systems  Constitutional: Negative for chills, fever and malaise/fatigue.  HENT: Negative for congestion and hearing loss.   Eyes: Negative for discharge.  Respiratory: Negative for cough, sputum production and shortness of breath.   Cardiovascular: Negative for chest pain, palpitations and leg swelling.  Gastrointestinal: Negative for abdominal pain, blood in stool, constipation, diarrhea, heartburn, nausea and vomiting.  Genitourinary: Negative for dysuria, frequency, hematuria and urgency.  Musculoskeletal: Negative for back pain, falls and myalgias.  Skin: Negative for rash.  Neurological: Negative for dizziness, sensory change, loss of consciousness, weakness and headaches.  Endo/Heme/Allergies: Negative for environmental allergies. Does not bruise/bleed easily.  Psychiatric/Behavioral: Negative for depression and suicidal ideas. The patient is not nervous/anxious and does not have insomnia.        Objective:    Physical Exam Constitutional:      General: She is not in acute distress.    Appearance: She is not diaphoretic.  HENT:     Head: Normocephalic and atraumatic.     Right Ear: External ear normal.     Left Ear: External ear normal.     Nose: Nose normal.     Mouth/Throat:     Pharynx: No oropharyngeal exudate.  Eyes:     General: No scleral icterus.       Right eye: No discharge.        Left eye: No discharge.     Conjunctiva/sclera: Conjunctivae normal.     Pupils: Pupils are equal, round, and reactive to light.  Neck:     Thyroid: No thyromegaly.  Cardiovascular:     Rate and Rhythm: Normal rate and regular rhythm.     Heart sounds: Normal heart sounds. No murmur heard.   Pulmonary:     Effort: Pulmonary effort is normal. No respiratory distress.     Breath sounds: Normal breath sounds. No wheezing or rales.  Abdominal:     General: Bowel sounds are normal. There is no  distension.     Palpations: Abdomen is soft. There is no mass.     Tenderness: There is no abdominal tenderness.  Musculoskeletal:        General: No tenderness. Normal range of motion.     Cervical back: Normal range of motion and neck supple.  Lymphadenopathy:     Cervical: No cervical adenopathy.  Skin:    General: Skin is warm and dry.     Findings: No rash.  Neurological:     Mental Status: She is alert and oriented to person, place, and time.     Cranial Nerves: No cranial nerve deficit.     Coordination: Coordination normal.     Deep Tendon Reflexes: Reflexes are normal and symmetric. Reflexes normal.     BP 120/79 (BP Location: Left Arm, Patient Position: Sitting, Cuff Size: Large)   Pulse 81   Temp 98.3 F (36.8 C) (Oral)   Resp 12   Ht _0  (1.549 m)   Wt 183 lb 6.4 oz (83.2 kg)   LMP 03/02/2011 (Approximate)   SpO2 97%   BMI 34.65 kg/m  Wt Readings from Last 3 Encounters:  06/04/20 183 lb 6.4 oz (83.2 kg)  01/09/20 183 lb (83 kg)  12/26/19 183 lb 9.6  oz (83.3 kg)    Diabetic Foot Exam - Simple   No data filed     Lab Results  Component Value Date   WBC 4.1 12/03/2019   HGB 13.0 12/03/2019   HCT 37.4 12/03/2019   PLT 231.0 12/03/2019   GLUCOSE 99 12/03/2019   CHOL 221 (H) 12/03/2019   TRIG 70.0 12/03/2019   HDL 93.80 12/03/2019   LDLCALC 114 (H) 12/03/2019   ALT 12 12/03/2019   AST 14 12/03/2019   NA 141 12/03/2019   K 4.1 12/03/2019   CL 105 12/03/2019   CREATININE 0.69 12/03/2019   BUN 18 12/03/2019   CO2 27 12/03/2019   TSH 4.92 (H) 12/03/2019   HGBA1C 5.1 12/03/2019    Lab Results  Component Value Date   TSH 4.92 (H) 12/03/2019   Lab Results  Component Value Date   WBC 4.1 12/03/2019   HGB 13.0 12/03/2019   HCT 37.4 12/03/2019   MCV 90.2 12/03/2019   PLT 231.0 12/03/2019   Lab Results  Component Value Date   NA 141 12/03/2019   K 4.1 12/03/2019   CO2 27 12/03/2019   GLUCOSE 99 12/03/2019   BUN 18 12/03/2019   CREATININE  0.69 12/03/2019   BILITOT 0.5 12/03/2019   ALKPHOS 88 12/03/2019   AST 14 12/03/2019   ALT 12 12/03/2019   PROT 6.7 12/03/2019   ALBUMIN 4.4 12/03/2019   CALCIUM 9.5 12/03/2019   GFR 85.78 12/03/2019   Lab Results  Component Value Date   CHOL 221 (H) 12/03/2019   Lab Results  Component Value Date   HDL 93.80 12/03/2019   Lab Results  Component Value Date   LDLCALC 114 (H) 12/03/2019   Lab Results  Component Value Date   TRIG 70.0 12/03/2019   Lab Results  Component Value Date   CHOLHDL 2 12/03/2019   Lab Results  Component Value Date   HGBA1C 5.1 12/03/2019       Assessment & Plan:   Problem List Items Addressed This Visit    UNSPECIFIED ANEMIA    Resolved will continue to monitor      Relevant Orders   CBC   Preventative health care    Patient encouraged to maintain heart healthy diet, regular exercise, adequate sleep. Consider daily probiotics. Take medications as prescribed. Dexa scan normal in 2019 repeat in next 3 years. Follows with GYN for pap and MGM, October 2020 repeat MGM after October 2021,      Relevant Orders   TSH   Hyperlipidemia, mixed    Encouraged heart healthy diet, increase exercise, avoid trans fats, consider a krill oil cap daily      Relevant Orders   Lipid panel   TSH   Hyperglycemia    hgba1c acceptable, minimize simple carbs. Increase exercise as tolerated.      Relevant Orders   Comprehensive metabolic panel   Hemoglobin A1c   TSH   Abnormal TSH    Mildly elevated on last blood draw but free T4 normal, repeat labs       Other Visit Diagnoses    Influenza vaccine needed    -  Primary   Relevant Orders   Flu Vaccine QUAD High Dose(Fluad) (Completed)      I have discontinued Daleigh C. Reidinger's valACYclovir and Suprep Bowel Prep Kit. I am also having her maintain her aspirin, calcium carbonate, multivitamin, cetirizine, famciclovir, Probiotic Product (PROBIOTIC PO), Calcium Carb-Cholecalciferol (CALCIUM 500 + D3  PO), clobetasol ointment, fluticasone, pantoprazole, venlafaxine  XR, and Krill Oil. We will stop administering sodium chloride.  No orders of the defined types were placed in this encounter.    Penni Homans, MD

## 2020-06-06 NOTE — Assessment & Plan Note (Signed)
hgba1c acceptable, minimize simple carbs. Increase exercise as tolerated.  

## 2020-06-06 NOTE — Assessment & Plan Note (Signed)
Resolved will continue to monitor 

## 2020-06-06 NOTE — Assessment & Plan Note (Signed)
Encouraged heart healthy diet, increase exercise, avoid trans fats, consider a krill oil cap daily 

## 2020-06-10 ENCOUNTER — Other Ambulatory Visit: Payer: Self-pay

## 2020-06-10 ENCOUNTER — Other Ambulatory Visit (INDEPENDENT_AMBULATORY_CARE_PROVIDER_SITE_OTHER): Payer: Managed Care, Other (non HMO)

## 2020-06-10 DIAGNOSIS — Z0000 Encounter for general adult medical examination without abnormal findings: Secondary | ICD-10-CM | POA: Diagnosis not present

## 2020-06-10 DIAGNOSIS — R739 Hyperglycemia, unspecified: Secondary | ICD-10-CM

## 2020-06-10 DIAGNOSIS — R7989 Other specified abnormal findings of blood chemistry: Secondary | ICD-10-CM

## 2020-06-10 DIAGNOSIS — D649 Anemia, unspecified: Secondary | ICD-10-CM

## 2020-06-10 DIAGNOSIS — E782 Mixed hyperlipidemia: Secondary | ICD-10-CM

## 2020-06-10 NOTE — Addendum Note (Signed)
Addended by: Kelle Darting A on: 06/10/2020 09:24 AM   Modules accepted: Orders

## 2020-06-11 LAB — LIPID PANEL
Cholesterol: 219 mg/dL — ABNORMAL HIGH (ref <200)
HDL: 93 mg/dL (ref >=50)
LDL Cholesterol (Calc): 111 mg/dL (calc) — ABNORMAL HIGH
Non-HDL Cholesterol (Calc): 126 mg/dL (calc) (ref <130)
Total CHOL/HDL Ratio: 2.4 (calc) (ref <5.0)
Triglycerides: 65 mg/dL (ref <150)

## 2020-06-11 LAB — CBC
HCT: 36.8 % (ref 35.0–45.0)
Hemoglobin: 12.3 g/dL (ref 11.7–15.5)
MCH: 30.4 pg (ref 27.0–33.0)
MCHC: 33.4 g/dL (ref 32.0–36.0)
MCV: 91.1 fL (ref 80.0–100.0)
MPV: 10.4 fL (ref 7.5–12.5)
Platelets: 227 10*3/uL (ref 140–400)
RBC: 4.04 10*6/uL (ref 3.80–5.10)
RDW: 12.8 % (ref 11.0–15.0)
WBC: 4 10*3/uL (ref 3.8–10.8)

## 2020-06-11 LAB — COMPREHENSIVE METABOLIC PANEL
AG Ratio: 2 (calc) (ref 1.0–2.5)
ALT: 14 U/L (ref 6–29)
AST: 16 U/L (ref 10–35)
Albumin: 4.3 g/dL (ref 3.6–5.1)
Alkaline phosphatase (APISO): 88 U/L (ref 37–153)
BUN: 15 mg/dL (ref 7–25)
CO2: 24 mmol/L (ref 20–32)
Calcium: 9.2 mg/dL (ref 8.6–10.4)
Chloride: 99 mmol/L (ref 98–110)
Creat: 0.74 mg/dL (ref 0.50–0.99)
Globulin: 2.2 g/dL (calc) (ref 1.9–3.7)
Glucose, Bld: 88 mg/dL (ref 65–99)
Potassium: 4.3 mmol/L (ref 3.5–5.3)
Sodium: 136 mmol/L (ref 135–146)
Total Bilirubin: 0.5 mg/dL (ref 0.2–1.2)
Total Protein: 6.5 g/dL (ref 6.1–8.1)

## 2020-06-11 LAB — TSH: TSH: 3.51 mIU/L (ref 0.40–4.50)

## 2020-06-11 LAB — HEMOGLOBIN A1C
Hgb A1c MFr Bld: 5.1 % of total Hgb (ref <5.7)
Mean Plasma Glucose: 100 (calc)
eAG (mmol/L): 5.5 (calc)

## 2020-06-11 LAB — T4, FREE: Free T4: 0.9 ng/dL (ref 0.8–1.8)

## 2020-08-04 ENCOUNTER — Other Ambulatory Visit: Payer: Self-pay | Admitting: Obstetrics & Gynecology

## 2020-08-14 ENCOUNTER — Ambulatory Visit: Payer: Managed Care, Other (non HMO) | Admitting: Obstetrics & Gynecology

## 2020-08-24 ENCOUNTER — Other Ambulatory Visit: Payer: Self-pay | Admitting: Obstetrics & Gynecology

## 2020-08-24 DIAGNOSIS — Z1231 Encounter for screening mammogram for malignant neoplasm of breast: Secondary | ICD-10-CM

## 2020-10-07 ENCOUNTER — Other Ambulatory Visit: Payer: Self-pay

## 2020-10-07 ENCOUNTER — Ambulatory Visit
Admission: RE | Admit: 2020-10-07 | Discharge: 2020-10-07 | Disposition: A | Discharge status: Home / Self Care | Payer: Managed Care, Other (non HMO) | Source: Ambulatory Visit | Attending: Obstetrics & Gynecology | Admitting: Obstetrics & Gynecology

## 2020-10-07 DIAGNOSIS — Z1231 Encounter for screening mammogram for malignant neoplasm of breast: Secondary | ICD-10-CM

## 2020-10-28 ENCOUNTER — Other Ambulatory Visit: Payer: Self-pay | Admitting: Family Medicine

## 2020-10-28 ENCOUNTER — Other Ambulatory Visit: Payer: Self-pay | Admitting: Obstetrics & Gynecology

## 2020-11-06 MED ORDER — FAMCICLOVIR 250 MG PO TABS: 250.0000 mg | ORAL_TABLET | Freq: Two times a day (BID) | ORAL | 4 refills | Status: DC

## 2020-11-06 NOTE — Addendum Note (Signed)
Addended by: Alen Blew on: 11/06/2020 12:12 PM   Modules accepted: Orders

## 2020-12-03 ENCOUNTER — Ambulatory Visit: Payer: Managed Care, Other (non HMO) | Admitting: Family Medicine

## 2021-01-28 ENCOUNTER — Ambulatory Visit (HOSPITAL_BASED_OUTPATIENT_CLINIC_OR_DEPARTMENT_OTHER): Payer: Managed Care, Other (non HMO) | Admitting: Obstetrics & Gynecology

## 2021-01-28 ENCOUNTER — Encounter (HOSPITAL_BASED_OUTPATIENT_CLINIC_OR_DEPARTMENT_OTHER): Payer: Self-pay

## 2021-03-05 ENCOUNTER — Other Ambulatory Visit: Payer: Self-pay | Admitting: Family Medicine

## 2021-05-07 ENCOUNTER — Other Ambulatory Visit: Payer: Self-pay | Admitting: Family Medicine

## 2021-05-11 ENCOUNTER — Other Ambulatory Visit: Payer: Self-pay | Admitting: Family Medicine

## 2021-05-11 ENCOUNTER — Other Ambulatory Visit: Payer: Self-pay

## 2021-05-11 ENCOUNTER — Other Ambulatory Visit (HOSPITAL_BASED_OUTPATIENT_CLINIC_OR_DEPARTMENT_OTHER): Payer: Self-pay

## 2021-05-11 ENCOUNTER — Encounter: Payer: Self-pay | Admitting: Family Medicine

## 2021-05-11 ENCOUNTER — Ambulatory Visit (INDEPENDENT_AMBULATORY_CARE_PROVIDER_SITE_OTHER): Payer: Managed Care, Other (non HMO) | Admitting: Family Medicine

## 2021-05-11 VITALS — BP 122/76 | HR 98 | Temp 98.1°F | Resp 16 | Wt 191.6 lb

## 2021-05-11 DIAGNOSIS — R7989 Other specified abnormal findings of blood chemistry: Secondary | ICD-10-CM

## 2021-05-11 DIAGNOSIS — L989 Disorder of the skin and subcutaneous tissue, unspecified: Secondary | ICD-10-CM | POA: Diagnosis not present

## 2021-05-11 DIAGNOSIS — Z0000 Encounter for general adult medical examination without abnormal findings: Secondary | ICD-10-CM

## 2021-05-11 DIAGNOSIS — E782 Mixed hyperlipidemia: Secondary | ICD-10-CM

## 2021-05-11 DIAGNOSIS — D649 Anemia, unspecified: Secondary | ICD-10-CM

## 2021-05-11 DIAGNOSIS — L578 Other skin changes due to chronic exposure to nonionizing radiation: Secondary | ICD-10-CM

## 2021-05-11 DIAGNOSIS — E663 Overweight: Secondary | ICD-10-CM

## 2021-05-11 DIAGNOSIS — R739 Hyperglycemia, unspecified: Secondary | ICD-10-CM

## 2021-05-11 DIAGNOSIS — F418 Other specified anxiety disorders: Secondary | ICD-10-CM

## 2021-05-11 LAB — CBC
HCT: 36.1 % (ref 36.0–46.0)
Hemoglobin: 12.3 g/dL (ref 12.0–15.0)
MCHC: 34 g/dL (ref 30.0–36.0)
MCV: 89.6 fl (ref 78.0–100.0)
Platelets: 228 10*3/uL (ref 150.0–400.0)
RBC: 4.02 Mil/uL (ref 3.87–5.11)
RDW: 13.5 % (ref 11.5–15.5)
WBC: 5 10*3/uL (ref 4.0–10.5)

## 2021-05-11 LAB — COMPREHENSIVE METABOLIC PANEL
ALT: 16 U/L (ref 0–35)
AST: 18 U/L (ref 0–37)
Albumin: 4.2 g/dL (ref 3.5–5.2)
Alkaline Phosphatase: 101 U/L (ref 39–117)
BUN: 21 mg/dL (ref 6–23)
CO2: 27 mEq/L (ref 19–32)
Calcium: 9.7 mg/dL (ref 8.4–10.5)
Chloride: 100 mEq/L (ref 96–112)
Creatinine, Ser: 0.67 mg/dL (ref 0.40–1.20)
GFR: 92.01 mL/min (ref >60.00)
Glucose, Bld: 97 mg/dL (ref 70–99)
Potassium: 3.8 mEq/L (ref 3.5–5.1)
Sodium: 137 mEq/L (ref 135–145)
Total Bilirubin: 0.5 mg/dL (ref 0.2–1.2)
Total Protein: 6.9 g/dL (ref 6.0–8.3)

## 2021-05-11 LAB — TSH: TSH: 2.21 u[IU]/mL (ref 0.35–5.50)

## 2021-05-11 LAB — HEMOGLOBIN A1C: Hgb A1c MFr Bld: 5.4 % (ref 4.6–6.5)

## 2021-05-11 LAB — LIPID PANEL
Cholesterol: 224 mg/dL — ABNORMAL HIGH (ref 0–200)
HDL: 100.2 mg/dL (ref >39.00)
LDL Cholesterol: 110 mg/dL — ABNORMAL HIGH (ref 0–99)
NonHDL: 123.78
Total CHOL/HDL Ratio: 2
Triglycerides: 71 mg/dL (ref 0.0–149.0)
VLDL: 14.2 mg/dL (ref 0.0–40.0)

## 2021-05-11 LAB — T4, FREE: Free T4: 0.83 ng/dL (ref 0.60–1.60)

## 2021-05-11 MED ORDER — SHINGRIX 50 MCG/0.5ML IM SUSR
INTRAMUSCULAR | 1 refills | Status: DC
  Filled 2021-05-11: qty 1, 30d supply, fill #0
  Filled 2021-12-03: qty 1, 1d supply, fill #1

## 2021-05-11 NOTE — Assessment & Plan Note (Signed)
Very frustrated and stressed but is not interested in meds at this time as she is retiring this week and will try and improve diet and exercise. She can consider Wegovy or Saxenda later and let us know

## 2021-05-11 NOTE — Progress Notes (Signed)
Patient ID: Breanna Bridges, female    DOB: Jan 03, 1956  Age: 65 y.o. MRN: [id number]    Subjective:  Subjective  HPI Breanna Bridges presents for office visit today for follow up on allergies and anxiety/depression. She is doing well and has no recent febrile illness or hospitalizations to report. 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 Denies CP/palp/SOB/HA/congestion/fevers/GI or GU c/o. Taking meds as prescribed. She is experiencing life stressors that are increasing her anxiety. She reports that her best has passed away due to having covid and contracting a pneumonia infection. He stepson has passed away at the age of 47 and her husband is currently sick.   Review of Systems  Constitutional:  Negative for chills, fatigue and fever.  HENT:  Negative for congestion, rhinorrhea, sinus pressure, sinus pain, sore throat and trouble swallowing.   Eyes:  Negative for pain.  Respiratory:  Negative for cough and shortness of breath.   Cardiovascular:  Negative for chest pain, palpitations and leg swelling.  Gastrointestinal:  Negative for abdominal pain, blood in stool, diarrhea, nausea and vomiting.  Genitourinary:  Negative for decreased urine volume, flank pain, frequency, vaginal bleeding and vaginal discharge.  Musculoskeletal:  Negative for back pain.  Neurological:  Negative for headaches.   History Past Medical History:  Diagnosis Date   Abnormal Pap smear    CIN I-LEEP   Allergy    Anemia    borderline    Arthritis    back    Depression    h/o depression/anxiety   Depression with anxiety 01/18/2015   GERD (gastroesophageal reflux disease)    HSV infection    Hyperglycemia 01/31/2016   Other and unspecified hyperlipidemia 05/20/2013   Stress fracture 2008   left foot   Trigger finger of left hand    ring finger    She has a past surgical history that includes Hammer toe surgery (10+ yrs); LEEP (15+ yrs); Refractive  surgery (5 yrs ago); Foot surgery (Left, 04/15/14); Foot surgery (Left, 5/16); Ankle surgery (Left, 07/2016); Breast biopsy (Left, 2018); Colonoscopy; and Wisdom tooth extraction.   Her family history includes Anxiety disorder in her brother, mother, sister, and sister; Cancer in her cousin, maternal aunt, and paternal aunt; Colon polyps in her sister; Diabetes in her mother and sister; Diverticulosis in her sister; Fibrocystic breast disease in her sister; Hyperlipidemia in her sister; Hypertension in her mother and sister; Obesity in her brother, mother, paternal grandmother, sister, sister, and sister; Other in her father, mother, and paternal grandfather; Thyroid disease in her sister.She reports that she quit smoking about 37 years ago. Her smoking use included cigarettes. She smoked an average of .25 packs per day. She has never used smokeless tobacco. She reports current alcohol use of about 14.0 standard drinks per week. She reports that she does not use drugs.  Current Outpatient Medications on File Prior to Visit  Medication Sig Dispense Refill   aspirin 81 MG tablet Take 81 mg by mouth daily.       Calcium Carb-Cholecalciferol (CALCIUM 500 + D3 PO) Take by mouth.     cetirizine (ZYRTEC) 10 MG tablet Take 10 mg by mouth daily.       clobetasol ointment (TEMOVATE) 0.05 % as needed.      famciclovir (FAMVIR) 250 MG tablet Take 1 tablet (250 mg total) by mouth 2 (two) times daily. 180 tablet 4   fluticasone (FLONASE) 50 MCG/ACT nasal spray SPRAY  ONCE INTO EACH NOSTRIL DAILY. IN [place], SUMMER, AND FALL 48 mL 3   Krill Oil 500 MG CAPS Take by mouth once.     multivitamin (THERAGRAN) per tablet Take 1 tablet by mouth daily.       pantoprazole (PROTONIX) 40 MG tablet TAKE 1 TABLET BY MOUTH EVERY DAY 90 tablet 3   Probiotic Product (PROBIOTIC PO) Take by mouth daily.     venlafaxine XR (EFFEXOR-XR) 75 MG 24 hr capsule TAKE ONE CAPSULE BY MOUTH EVERY DAY WITH BREAKFAST 90 capsule 1   No current  facility-administered medications on file prior to visit.     Objective:  Objective  Physical Exam Constitutional:      General: She is not in acute distress.    Appearance: Normal appearance. She is not ill-appearing or toxic-appearing.  HENT:     Head: Normocephalic and atraumatic.     Right Ear: Tympanic membrane, ear canal and external ear normal.     Left Ear: Tympanic membrane, ear canal and external ear normal.     Nose: No congestion or rhinorrhea.  Eyes:     Extraocular Movements: Extraocular movements intact.     Pupils: Pupils are equal, round, and reactive to light.  Cardiovascular:     Rate and Rhythm: Normal rate and regular rhythm.     Pulses: Normal pulses.     Heart sounds: Normal heart sounds. No murmur heard. Pulmonary:     Effort: Pulmonary effort is normal. No respiratory distress.     Breath sounds: Normal breath sounds. No wheezing, rhonchi or rales.  Abdominal:     General: Bowel sounds are normal.     Palpations: Abdomen is soft. There is no mass.     Tenderness: There is no abdominal tenderness. There is no guarding.     Hernia: No hernia is present.  Musculoskeletal:        General: Normal range of motion.     Cervical back: Normal range of motion and neck supple.  Skin:    General: Skin is warm and dry.     Findings: Lesion (midback) present.  Neurological:     Mental Status: She is alert and oriented to person, place, and time.  Psychiatric:        Behavior: Behavior normal.   BP 122/76   Pulse 98   Temp 98.1 F (36.7 C)   Resp 16   Wt 191 lb 9.6 oz (86.9 kg)   LMP 03/02/2011 (Approximate)   SpO2 99%   BMI 36.20 kg/m  Wt Readings from Last 3 Encounters:  05/11/21 191 lb 9.6 oz (86.9 kg)  06/04/20 183 lb 6.4 oz (83.2 kg)  01/09/20 183 lb (83 kg)     Lab Results  Component Value Date   WBC 4.0 06/10/2020   HGB 12.3 06/10/2020   HCT 36.8 06/10/2020   PLT 227 06/10/2020   GLUCOSE 88 06/10/2020   CHOL 219 (H) 06/10/2020   TRIG  65 06/10/2020   HDL 93 06/10/2020   LDLCALC 111 (H) 06/10/2020   ALT 14 06/10/2020   AST 16 06/10/2020   NA 136 06/10/2020   K 4.3 06/10/2020   CL 99 06/10/2020   CREATININE 0.74 06/10/2020   BUN 15 06/10/2020   CO2 24 06/10/2020   TSH 3.51 06/10/2020   HGBA1C 5.1 06/10/2020    MM 3D SCREEN BREAST BILATERAL  Result Date: 10/07/2020 CLINICAL DATA:  Screening. EXAM: DIGITAL SCREENING BILATERAL MAMMOGRAM WITH TOMO AND CAD COMPARISON:  Previous  exam(s). ACR Breast Density Category b: There are scattered areas of fibroglandular density. FINDINGS: There are no findings suspicious for malignancy. The images were evaluated with computer-aided detection. IMPRESSION: No mammographic evidence of malignancy. A result letter of this screening mammogram will be mailed directly to the patient. RECOMMENDATION: Screening mammogram in one year. (Code:SM-B-01Y) BI-RADS CATEGORY  1: Negative. Electronically Signed   By: Lillia Mountain M.D.   On: 10/07/2020 15:22     Assessment & Plan:  Plan    No orders of the defined types were placed in this encounter.   Problem List Items Addressed This Visit       Other   Overweight    Very frustrated and stressed but is not interested in meds at this time as she is retiring this week and will try and improve diet and exercise. She can consider Wegovy or Saxenda later and let us know      Hyperlipidemia, mixed    Encourage heart healthy diet such as MIND or DASH diet, increase exercise, avoid trans fats, simple carbohydrates and processed foods, consider a krill or fish or flaxseed oil cap daily.       Depression with anxiety    She is on Venlafaxine 75 mg daily and feels it helps so no changes      Hyperglycemia    hgba1c acceptable, minimize simple carbs. Increase exercise as tolerated.      Abnormal TSH    Continue to monitor      Other Visit Diagnoses     Skin lesion of back    -  Primary   Relevant Orders   Ambulatory referral to Dermatology    Sun-damaged skin       Relevant Orders   Ambulatory referral to Dermatology       Follow-up: Return in about 6 months (around 11/09/2021) for welcome to medicare.  I, Suezanne Jacquet, acting as a scribe for Penni Homans, MD, have documented all relevent documentation on behalf of Penni Homans, MD, as directed by Penni Homans, MD while in the presence of Penni Homans, MD.  I, Mosie Lukes, MD personally performed the services described in this documentation. All medical record entries made by the scribe were at my direction and in my presence. I have reviewed the chart and agree that the record reflects my personal performance and is accurate and complete

## 2021-05-11 NOTE — Assessment & Plan Note (Signed)
Encouraged to go to pharmacy and get first Shingrix shot today. 2nd covid booster in 2 weeks and flu shot 2 weeks after that

## 2021-05-11 NOTE — Addendum Note (Signed)
Addended by: Manuela Schwartz on: 05/11/2021 10:27 AM   Modules accepted: Orders

## 2021-05-11 NOTE — Patient Instructions (Addendum)
  Paxlovid is the new COVID medication we can give you if you get COVID so make sure you test if you have symptoms because we have to treat by day 5 of symptoms for it to be effective. If you are positive let us know so we can treat. If a home test is negative and your symptoms are persistent get a PCR test. Can check testing locations at [email or web address] If you are positive we will make an appointment with [place] and we will send in Paxlovid if you would like it. Check with your pharmacy before we meet to confirm they have it in stock, if they do not then we can get the prescription at the [place]   Encouraged DASH or MIND diet, decrease po intake and increase exercise as tolerated. Needs 7-8 hours of sleep nightly. Avoid trans fats, eat small, frequent meals every 4-5 hours with lean proteins, complex carbs and healthy fats. Minimize simple carbs, high fat foods and processed foods   Saxenda or Wegovy are new weight loss meds that are injectable and can check on coverage and let us know if you want to try them.

## 2021-05-11 NOTE — Assessment & Plan Note (Signed)
She is on Venlafaxine 75 mg daily and feels it helps so no changes

## 2021-05-11 NOTE — Assessment & Plan Note (Signed)
hgba1c acceptable, minimize simple carbs. Increase exercise as tolerated.  

## 2021-05-11 NOTE — Assessment & Plan Note (Signed)
Continue to monitor

## 2021-05-11 NOTE — Assessment & Plan Note (Signed)
Encourage heart healthy diet such as MIND or DASH diet, increase exercise, avoid trans fats, simple carbohydrates and processed foods, consider a krill or fish or flaxseed oil cap daily.  °

## 2021-05-18 ENCOUNTER — Ambulatory Visit (HOSPITAL_BASED_OUTPATIENT_CLINIC_OR_DEPARTMENT_OTHER): Payer: Managed Care, Other (non HMO) | Admitting: Obstetrics & Gynecology

## 2021-05-31 ENCOUNTER — Other Ambulatory Visit: Payer: Self-pay

## 2021-05-31 ENCOUNTER — Ambulatory Visit (INDEPENDENT_AMBULATORY_CARE_PROVIDER_SITE_OTHER): Payer: Medicare Other | Admitting: Obstetrics & Gynecology

## 2021-05-31 ENCOUNTER — Encounter (HOSPITAL_BASED_OUTPATIENT_CLINIC_OR_DEPARTMENT_OTHER): Payer: Self-pay | Admitting: Obstetrics & Gynecology

## 2021-05-31 VITALS — BP 129/79 | HR 93 | Ht 61.0 in | Wt 191.4 lb

## 2021-05-31 DIAGNOSIS — Z780 Asymptomatic menopausal state: Secondary | ICD-10-CM

## 2021-05-31 DIAGNOSIS — B009 Herpesviral infection, unspecified: Secondary | ICD-10-CM | POA: Diagnosis not present

## 2021-05-31 DIAGNOSIS — Z9889 Other specified postprocedural states: Secondary | ICD-10-CM | POA: Diagnosis not present

## 2021-05-31 DIAGNOSIS — E2839 Other primary ovarian failure: Secondary | ICD-10-CM | POA: Diagnosis not present

## 2021-05-31 DIAGNOSIS — Z9189 Other specified personal risk factors, not elsewhere classified: Secondary | ICD-10-CM

## 2021-05-31 MED ORDER — FAMCICLOVIR 250 MG PO TABS: 250.0000 mg | ORAL_TABLET | Freq: Two times a day (BID) | ORAL | 4 refills | Status: DC

## 2021-05-31 NOTE — Progress Notes (Signed)
65 y.o. Breanna Bridges Married White or Caucasian female here for breast and pelvic exam.  I am also following her for h/o HSV.  She just retired.  This is her third week of retirement.  She reports he had pneumonia earlier this year and took a while to recovery.  This was not Covid related.  Denies vaginal bleeding.  Going to join [employer] and do [employer].  Uses Famvir for HSV treatment.  Under good control.  No issues with medication.  Patient's last menstrual period was 03/02/2011 (approximate).          Sexually active: Yes.    H/O STD:  HSV  Health Maintenance: PCP:  Dr. Randel Pigg.  Last wellness appt was 05/2021.  Did blood work at that appt:  yet Vaccines are up to date:  Influenza has not been done yet.  Has not done second covid vaccines Colonoscopy:  01/09/2020 MMG:  10/07/2020 Negative BMD:  03/21/2018, normal Last pap smear:  06/04/2019 Negative.   H/o abnormal pap smear:  LEEP 1995   reports that she quit smoking about 37 years ago. Her smoking use included cigarettes. She smoked an average of .25 packs per day. She has never used smokeless tobacco. She reports current alcohol use of about 14.0 standard drinks per week. She reports that she does not use drugs.  Past Medical History:  Diagnosis Date   Abnormal Pap smear    CIN I-LEEP   Allergy    Anemia    borderline    Arthritis    back    Depression    h/o depression/anxiety   Depression with anxiety 01/18/2015   GERD (gastroesophageal reflux disease)    HSV infection    Hyperglycemia 01/31/2016   Other and unspecified hyperlipidemia 05/20/2013   Stress fracture 2008   left foot   Trigger finger of left hand    ring finger    Past Surgical History:  Procedure Laterality Date   ANKLE SURGERY Left 07/2016   BREAST BIOPSY Left 2018   COLONOSCOPY     last 2011 DB normal    FOOT SURGERY Left 04/15/14   left foot-bone spur, Dr. Sharol Given; second surgery 01/2015 to correct prior surgery   FOOT SURGERY Left 5/16   Dr  Beckey Downing at [place]  10+ yrs   left foot   LEEP  15+ yrs   normal paps since then   REFRACTIVE SURGERY  5 yrs ago   b/l   WISDOM TOOTH EXTRACTION      Current Outpatient Medications  Medication Sig Dispense Refill   aspirin 81 MG tablet Take 81 mg by mouth daily.       Calcium Carb-Cholecalciferol (CALCIUM 500 + D3 PO) Take by mouth.     cetirizine (ZYRTEC) 10 MG tablet Take 10 mg by mouth daily.       clobetasol ointment (TEMOVATE) 0.05 % as needed.      fluticasone (FLONASE) 50 MCG/ACT nasal spray SPRAY ONCE INTO EACH NOSTRIL DAILY. IN [place], SUMMER, AND FALL 48 mL 3   Krill Oil 500 MG CAPS Take by mouth once.     multivitamin (THERAGRAN) per tablet Take 1 tablet by mouth daily.       pantoprazole (PROTONIX) 40 MG tablet TAKE 1 TABLET BY MOUTH EVERY DAY 90 tablet 3   Probiotic Product (PROBIOTIC PO) Take by mouth daily.     venlafaxine XR (EFFEXOR-XR) 75 MG 24 hr capsule TAKE ONE CAPSULE BY MOUTH EVERY  DAY WITH BREAKFAST 90 capsule 1   Zoster Vaccine Adjuvanted [facility]) injection Inject into the muscle. 1 each 1   famciclovir (FAMVIR) 250 MG tablet Take 1 tablet (250 mg total) by mouth 2 (two) times daily. 180 tablet 4   No current facility-administered medications for this visit.    Family History  Problem Relation Age of Onset   Other Mother        CHF   Diabetes Mother        Borderline   Hypertension Mother    Obesity Mother    Anxiety disorder Mother    Other Father        CHF   Obesity Sister    Diabetes Sister        type 2   Hypertension Sister    Anxiety disorder Sister    Diverticulosis Sister    Hyperlipidemia Sister    Obesity Brother    Anxiety disorder Brother    Cancer Maternal Aunt        breast   Cancer Paternal Aunt        Breast   Obesity Paternal Grandmother    Other Paternal Grandfather        Committed suicide during the [place] Depression   Obesity Sister    Anxiety disorder Sister    Fibrocystic breast disease Sister     Thyroid disease Sister    Colon polyps Sister    Obesity Sister    Cancer Cousin        recurrent metastatic, 10 year span   Colon cancer Neg Hx    Esophageal cancer Neg Hx    Rectal cancer Neg Hx    Stomach cancer Neg Hx     Review of Systems  All other systems reviewed and are negative.  Exam:   BP 129/79 (BP Location: Right Arm, Patient Position: Sitting, Cuff Size: Large)   Pulse 93   Ht '5\' 1"'$  (1.549 m)   Wt 191 lb 6.4 oz (86.8 kg)   LMP 03/02/2011 (Approximate)   BMI 36.16 kg/m   Height: '5\' 1"'$  (154.9 cm)  General appearance: alert, cooperative and appears stated age Breasts: normal appearance, no masses or tenderness Abdomen: soft, non-tender; bowel sounds normal; no masses,  no organomegaly Lymph nodes: Cervical, supraclavicular, and axillary nodes normal.  No abnormal inguinal nodes palpated Neurologic: Grossly normal  Pelvic: External genitalia:  no lesions              Urethra:  normal appearing urethra with no masses, tenderness or lesions              Bartholins and Skenes: normal                 Vagina: normal appearing vagina with atrophic changes and no discharge, no lesions              Cervix: no lesions              Pap taken: No. Bimanual Exam:  Uterus:  normal size, contour, position, consistency, mobility, non-tender              Adnexa: normal adnexa and no mass, fullness, tenderness               Rectovaginal: Confirms               Anus:  normal sphincter tone, no lesions  Chaperone, Octaviano Batty, CMA, was present for exam.  Assessment/Plan: 1. GYN exam for high-risk Medicare  patient - pap 2020 .  Repeat next year. - Colonoscopy 01/09/2020 - MMG 10/07/2020 - BMD normal, plan to repeat in 2021 - care gaps reviewed/updated  2. Postmenopausal - no HRT  3. Hypoestrogenemia - DG BONE DENSITY (DXA); Future  4. HSV (herpes simplex virus) infection - famciclovir (FAMVIR) 250 MG tablet; Take 1 tablet (250 mg total) by mouth 2 (two) times daily.   Dispense: 180 tablet; Refill: 4

## 2021-06-03 DIAGNOSIS — Z9889 Other specified postprocedural states: Secondary | ICD-10-CM | POA: Insufficient documentation

## 2021-06-03 DIAGNOSIS — B009 Herpesviral infection, unspecified: Secondary | ICD-10-CM | POA: Insufficient documentation

## 2021-06-24 ENCOUNTER — Ambulatory Visit: Payer: Medicare Other | Attending: Internal Medicine

## 2021-06-24 DIAGNOSIS — Z23 Encounter for immunization: Secondary | ICD-10-CM

## 2021-06-24 NOTE — Progress Notes (Signed)
   Covid-19 Vaccination Clinic  Name:  ARYELLA BESECKER    MRN: [id number] DOB: 1956-01-05  06/24/2021  Ms. Canny was observed post Covid-19 immunization for 15 minutes without incident. She was provided with Vaccine Information Sheet and instruction to access the V-Safe system.   Ms. Eid was instructed to call 911 with any severe reactions post vaccine: Difficulty breathing  Swelling of face and throat  A fast heartbeat  A bad rash all over body  Dizziness and weakness

## 2021-07-16 ENCOUNTER — Other Ambulatory Visit (HOSPITAL_BASED_OUTPATIENT_CLINIC_OR_DEPARTMENT_OTHER): Payer: Self-pay

## 2021-07-16 MED ORDER — PFIZER COVID-19 VAC BIVALENT 30 MCG/0.3ML IM SUSP
INTRAMUSCULAR | 0 refills | Status: DC
  Filled 2021-07-16: qty 0.3, 1d supply, fill #0

## 2021-08-12 ENCOUNTER — Other Ambulatory Visit: Payer: Self-pay

## 2021-08-12 ENCOUNTER — Ambulatory Visit
Admission: EM | Admit: 2021-08-12 | Discharge: 2021-08-12 | Disposition: A | Discharge status: Home / Self Care | Payer: Medicare Other

## 2021-08-12 DIAGNOSIS — J069 Acute upper respiratory infection, unspecified: Secondary | ICD-10-CM

## 2021-08-12 DIAGNOSIS — J029 Acute pharyngitis, unspecified: Secondary | ICD-10-CM | POA: Diagnosis not present

## 2021-08-12 MED ORDER — PROMETHAZINE-DM 6.25-15 MG/5ML PO SYRP: 5.0000 mL | ORAL_SOLUTION | Freq: Four times a day (QID) | ORAL | 0 refills | Status: DC | PRN

## 2021-08-12 MED ORDER — LIDOCAINE VISCOUS HCL 2 % MT SOLN: 15.0000 mL | OROMUCOSAL | 0 refills | Status: AC | PRN

## 2021-08-12 NOTE — Discharge Instructions (Addendum)
I agree, I believe that you have influenza.  You do have every symptom and despite multiple attempts at getting an accurate temperature without success, I still think you have a fever.  I am so sorry you do not feel well.  For your sore throat, please swish and swallow 15 mL of viscous lidocaine every 3 hours as needed while you are awake.    I have also provided you with promethazine cough syrup that you can take at bedtime to help you sleep at night, you can also take it in the daytime if it does not make you too sleepy.  If it does, you can always cut the dose in half.    I believe in the next few days you should be feeling much better.  If you are not, or if your symptoms get worse, please come back so I can reevaluate you for bacterial superinfection on top of influenza infection such as ear infection, sinusitis, pneumonia.  At this time, I do not believe that antibiotics would do any good.

## 2021-08-12 NOTE — ED Provider Notes (Signed)
[facility]    CSN: [id number] Arrival date & time: 08/12/21  1054    HISTORY  No chief complaint on file.  HPI Breanna Bridges is a 65 y.o. female. Pt reports she is having a cough, back pain , sore throat, chills, and body aches that started Sunday. She was exposed to the flu on Thanksgiving day, states her daughter works in the ICU and brought it home.  The history is provided by the patient.  Past Medical History:  Diagnosis Date   Abnormal Pap smear    CIN I-LEEP   Allergy    Anemia    borderline    Arthritis    back    Depression    h/o depression/anxiety   Depression with anxiety 01/18/2015   GERD (gastroesophageal reflux disease)    HSV infection    Hyperglycemia 01/31/2016   Other and unspecified hyperlipidemia 05/20/2013   Stress fracture 2008   left foot   Trigger finger of left hand    ring finger   Patient Active Problem List   Diagnosis Date Noted   HSV (herpes simplex virus) infection 06/03/2021   History of loop electrical excision procedure (LEEP) 06/03/2021   Abnormal TSH 06/06/2020   Post herpetic neuralgia 10/27/2017   Hyperglycemia 01/31/2016   Depression with anxiety 01/18/2015   Sinusitis 12/05/2013   Hyperlipidemia, mixed 05/20/2013   Overweight 10/11/2010   UNSPECIFIED ANEMIA 10/11/2010   ELEVATED BP READING WITHOUT DX HYPERTENSION 10/11/2010   HEART MURMUR, HX OF 10/11/2010   CROUP, HX OF 10/11/2010   Past Surgical History:  Procedure Laterality Date   ANKLE SURGERY Left 07/2016   BREAST BIOPSY Left 2018   COLONOSCOPY     last 2011 DB normal    FOOT SURGERY Left 04/15/14   left foot-bone spur, Dr. Sharol Given; second surgery 01/2015 to correct prior surgery   FOOT SURGERY Left 5/16   Dr Beckey Downing at [facility]  10+ yrs   left foot   LEEP  15+ yrs   normal paps since then   REFRACTIVE SURGERY  5 yrs ago   b/l   WISDOM TOOTH EXTRACTION     OB History     Gravida  0   Para  0   Term  0   Preterm  0   AB   0   Living  0      SAB  0   IAB  0   Ectopic  0   Multiple  0   Live Births           Obstetric Comments  3 stepchildren        Home Medications    Prior to Admission medications   Medication Sig Start Date End Date Taking? Authorizing Provider  acetaminophen (TYLENOL) 500 MG tablet Take 500 mg by mouth every 6 (six) hours as needed.   Yes [provider]  guaiFENesin 200 MG tablet Take 200 mg by mouth every 4 (four) hours as needed for cough or to loosen phlegm.   Yes [provider]  lidocaine (XYLOCAINE) 2 % solution Use as directed 15 mLs in the mouth or throat every 3 (three) hours as needed for up to 5 days for mouth pain (Throat pain). 08/12/21 08/17/21 Yes Lynden Oxford Scales, PA-C  promethazine-dextromethorphan (PROMETHAZINE-DM) 6.25-15 MG/5ML syrup Take 5 mLs by mouth 4 (four) times daily as needed for cough. 08/12/21  Yes Lynden Oxford Scales, PA-C  aspirin 81 MG  tablet Take 81 mg by mouth daily.      [provider]  Calcium Carb-Cholecalciferol (CALCIUM 500 + D3 PO) Take by mouth.    [provider]  cetirizine (ZYRTEC) 10 MG tablet Take 10 mg by mouth daily.      [provider]  clobetasol ointment (TEMOVATE) 0.05 % as needed.  01/01/20   [provider]  COVID-19 mRNA bivalent vaccine, Pfizer, (PFIZER COVID-19 VAC BIVALENT) injection Inject into the muscle. 06/24/21   Carlyle Basques, MD  famciclovir (FAMVIR) 250 MG tablet Take 1 tablet (250 mg total) by mouth 2 (two) times daily. 05/31/21   Megan Salon, MD  fluticasone (FLONASE) 50 MCG/ACT nasal spray SPRAY ONCE INTO EACH NOSTRIL DAILY. IN [place], [place], AND FALL 02/14/20   Mosie Lukes, MD  Krill Oil 500 MG CAPS Take by mouth once.    [provider]  multivitamin [facility]) per tablet Take 1 tablet by mouth daily.      [provider]  pantoprazole (PROTONIX) 40 MG tablet TAKE 1 TABLET BY MOUTH EVERY DAY 03/05/21   Mosie Lukes, MD  Probiotic Product (PROBIOTIC PO) Take by mouth daily.    [provider]  venlafaxine XR (EFFEXOR-XR) 75 MG 24 hr capsule TAKE ONE CAPSULE BY MOUTH EVERY DAY WITH BREAKFAST 05/07/21   Mosie Lukes, MD  Zoster Vaccine Adjuvanted [facility]) injection Inject into the muscle. 05/11/21   Carlyle Basques, MD   Family History Family History  Problem Relation Age of Onset   Other Mother        CHF   Diabetes Mother        Borderline   Hypertension Mother    Obesity Mother    Anxiety disorder Mother    Other Father        CHF   Obesity Sister    Diabetes Sister        type 2   Hypertension Sister    Anxiety disorder Sister    Diverticulosis Sister    Hyperlipidemia Sister    Obesity Brother    Anxiety disorder Brother    Cancer Maternal Aunt        breast   Cancer Paternal Aunt        Breast   Obesity Paternal Grandmother    Other Paternal Grandfather        Committed suicide during the [place] Depression   Obesity Sister    Anxiety disorder Sister    Fibrocystic breast disease Sister    Thyroid disease Sister    Colon polyps Sister    Obesity Sister    Cancer Cousin        recurrent metastatic, 10 year span   Colon cancer Neg Hx    Esophageal cancer Neg Hx    Rectal cancer Neg Hx    Stomach cancer Neg Hx    Social History Social History   Tobacco Use   Smoking status: Former    Packs/day: 0.25    Types: Cigarettes    Quit date: 09/13/1983    Years since quitting: 37.9   Smokeless tobacco: Never  Vaping Use   Vaping Use: Never used  Substance Use Topics   Alcohol use: Yes    Alcohol/week: 14.0 standard drinks    Types: 14 Glasses of wine per week   Drug use: No   Allergies   Codeine  Review of Systems Review of Systems Pertinent findings noted in history of present illness.   Physical  Exam Triage Vital Signs ED Triage Vitals  Enc Vitals Group     BP 07/09/21 0827 (!) 147/82     Pulse Rate 07/09/21 0827 72     Resp 07/09/21 0827 18      Temp 07/09/21 0827 98.3 F (36.8 C)     Temp Source 07/09/21 0827 Oral     SpO2 07/09/21 0827 98 %     Weight --      Height --      Head Circumference --      Peak Flow --      Pain Score 07/09/21 0826 5     Pain Loc --      Pain Edu? --      Excl. in [place]? --   No data found.  Updated Vital Signs BP 130/86 (BP Location: Left Arm)   Pulse 94   Temp 98.5 F (36.9 C) (Oral)   Resp 18   LMP 03/02/2011 (Approximate)   SpO2 97%   Physical Exam  Visual Acuity Right Eye Distance:   Left Eye Distance:   Bilateral Distance:    Right Eye Near:   Left Eye Near:    Bilateral Near:     UC Couse / Diagnostics / Procedures:    EKG  Radiology No results found.  Procedures Procedures (including critical care time)  UC Diagnoses / Final Clinical Impressions(s)   I have reviewed the triage vital signs and the nursing notes.  Pertinent labs & imaging results that were available during my care of the patient were reviewed by me and considered in my medical decision making (see chart for details).   Final diagnoses:  Acute pharyngitis, unspecified etiology  Viral upper respiratory illness   More than likely due to influenza.  Patient provided with viscous lidocaine for sore throat and promethazine for cough.  Conservative care recommended.  Viral testing not indicated due to duration of symptoms.  All questions addressed.  Disposition Upon Discharge:  Condition: stable for discharge home Home: take medications as prescribed; routine discharge instructions as discussed; follow up as advised.  Patient presented with an acute illness with associated systemic symptoms and significant discomfort requiring urgent management. In my opinion, this is a condition that a prudent lay person (someone who possesses an average knowledge of health and medicine) may potentially expect to result in complications if not addressed urgently such as respiratory distress, impairment of bodily function or  dysfunction of bodily organs.   Routine symptom specific, illness specific and/or disease specific instructions were discussed with the patient and/or caregiver at length.   As such, the patient has been evaluated and assessed, work-up was performed and treatment was provided in alignment with urgent care protocols and evidence based medicine.  Patient/parent/caregiver has been advised that the patient may require follow up for further testing and treatment if the symptoms continue in spite of treatment, as clinically indicated and appropriate.  The patient was tested for COVID-19, Influenza and/or RSV, then the patient/parent/guardian was advised to isolate at home pending the results of his/her diagnostic coronavirus test and potentially longer if they're positive. I have also advised pt that if his/her COVID-19 test returns positive, it's recommended to self-isolate for at least 10 days after symptoms first appeared AND until fever-free for 24 hours without fever reducer AND other symptoms have improved or resolved. Discussed self-isolation recommendations as well as instructions for household member/close contacts as per the [facility] and [place] [facility], and also gave patient the COVID packet with  this information.  Patient/parent/caregiver has been advised to return to the [facility] or PCP in 3-5 days if no better; to PCP or the Emergency Department if new signs and symptoms develop, or if the current signs or symptoms continue to change or worsen for further workup, evaluation and treatment as clinically indicated and appropriate  The patient will follow up with their current PCP if and as advised. If the patient does not currently have a PCP we will assist them in obtaining one.   The patient may need specialty follow up if the symptoms continue, in spite of conservative treatment and management, for further workup, evaluation, consultation and treatment as clinically indicated and  appropriate.  Patient/parent/caregiver verbalized understanding and agreement of plan as discussed.  All questions were addressed during visit.  Please see discharge instructions below for further details of plan.  ED Prescriptions     Medication Sig Dispense Auth. Provider   lidocaine (XYLOCAINE) 2 % solution Use as directed 15 mLs in the mouth or throat every 3 (three) hours as needed for up to 5 days for mouth pain (Throat pain). 300 mL Lynden Oxford Scales, PA-C   promethazine-dextromethorphan (PROMETHAZINE-DM) 6.25-15 MG/5ML syrup Take 5 mLs by mouth 4 (four) times daily as needed for cough. 118 mL Lynden Oxford Scales, PA-C      PDMP not reviewed this encounter.  Pending results:  Labs Reviewed - No data to display  Medications Ordered in UC: Medications - No data to display  Discharge Instructions:   Discharge Instructions      I agree, I believe that you have influenza.  You do have every symptom and despite multiple attempts at getting an accurate temperature without success, I still think you have a fever.  I am so sorry you do not feel well.  For your sore throat, please swish and swallow 15 mL of viscous lidocaine every 3 hours as needed while you are awake.    I have also provided you with promethazine cough syrup that you can take at bedtime to help you sleep at night, you can also take it in the daytime if it does not make you too sleepy.  If it does, you can always cut the dose in half.    I believe in the next few days you should be feeling much better.  If you are not, or if your symptoms get worse, please come back so I can reevaluate you for bacterial superinfection on top of influenza infection such as ear infection, sinusitis, pneumonia.  At this time, I do not believe that antibiotics would do any good.        Lynden Oxford Scales, PA-C 08/12/21 1352

## 2021-08-12 NOTE — ED Triage Notes (Signed)
Pt reports she is having a cough, back pain , sore throat, chills, and body aches that started Sunday. She was exposed to the flu.

## 2021-09-12 HISTORY — PX: CATARACT EXTRACTION: SUR2

## 2021-09-20 ENCOUNTER — Other Ambulatory Visit: Payer: Self-pay | Admitting: Family Medicine

## 2021-10-25 DIAGNOSIS — H43811 Vitreous degeneration, right eye: Secondary | ICD-10-CM | POA: Diagnosis not present

## 2021-11-01 ENCOUNTER — Other Ambulatory Visit: Payer: Self-pay | Admitting: Family Medicine

## 2021-11-01 DIAGNOSIS — Z1231 Encounter for screening mammogram for malignant neoplasm of breast: Secondary | ICD-10-CM

## 2021-11-06 ENCOUNTER — Other Ambulatory Visit: Payer: Self-pay | Admitting: Family Medicine

## 2021-11-17 ENCOUNTER — Ambulatory Visit
Admission: RE | Admit: 2021-11-17 | Discharge: 2021-11-17 | Disposition: A | Discharge status: Home / Self Care | Payer: Medicare Other | Source: Ambulatory Visit | Attending: Family Medicine | Admitting: Family Medicine

## 2021-11-17 DIAGNOSIS — Z1231 Encounter for screening mammogram for malignant neoplasm of breast: Secondary | ICD-10-CM

## 2021-11-18 ENCOUNTER — Ambulatory Visit: Payer: Managed Care, Other (non HMO) | Admitting: Family Medicine

## 2021-11-25 ENCOUNTER — Ambulatory Visit: Payer: Medicare Other | Admitting: Surgery

## 2021-11-25 ENCOUNTER — Encounter: Payer: Self-pay | Admitting: Surgery

## 2021-11-25 ENCOUNTER — Ambulatory Visit: Payer: Self-pay

## 2021-11-25 ENCOUNTER — Other Ambulatory Visit: Payer: Self-pay

## 2021-11-25 DIAGNOSIS — M25551 Pain in right hip: Secondary | ICD-10-CM | POA: Diagnosis not present

## 2021-11-25 DIAGNOSIS — M7061 Trochanteric bursitis, right hip: Secondary | ICD-10-CM | POA: Diagnosis not present

## 2021-11-25 DIAGNOSIS — M1611 Unilateral primary osteoarthritis, right hip: Secondary | ICD-10-CM | POA: Diagnosis not present

## 2021-11-25 DIAGNOSIS — M5459 Other low back pain: Secondary | ICD-10-CM

## 2021-11-25 NOTE — Progress Notes (Signed)
? ?Office Visit Note ?  ?Patient: Breanna Bridges           ?Date of Birth: [birth year]           ?MRN: [id number] ?Visit Date: 11/25/2021 ?             ?Requested by: Mosie Lukes, MD ?[place] ?[address] ?[place],  [place] [address] ?PCP: Mosie Lukes, MD ? ? ?Assessment & Plan: ?Visit Diagnoses:  ?1. Low back pain, unspecified back pain laterality, unspecified chronicity, unspecified whether sciatica present   ?2. Pain in right hip   ?3. Arthritis of right hip   ?4. Greater trochanteric bursitis of right hip   ? ? ?Plan: Reviewed x-rays with patient today.  She does have moderate right hip DJD.  We will schedule her to see Dr. Ninfa Linden here in the office to discuss surgical versus nonsurgical options.  Advised patient ultimately may come down her needing definitive treatment with right total hip replacement.  In hopes of giving her some relief of her right lateral hip pain from greater trochanteric bursitis offered injection.  After patient consent lateral hip was prepped with Betadine and greater trochanter bursa Marcaine/Depo-Medrol injection performed.  All questions answered. ? ?Follow-Up Instructions: Return in about 2 weeks (around 12/09/2021) for Consult with Dr. Ninfa Linden for right hip DJD.  Discuss surgical versus nonsurgical treatment.  ? ?Orders:  ?Orders Placed This Encounter  ?Procedures  ? Large Joint Inj  ? XR Lumbar Spine 2-3 Views  ? XR HIP UNILAT W OR W/O PELVIS 2-3 VIEWS RIGHT  ? ?No orders of the defined types were placed in this encounter. ? ? ? ? Procedures: ?Large Joint Inj: R greater trochanter on 11/25/2021 2:23 PM ?Indications: pain ?Details: 22 G 3.5 in needle, lateral approach ?Medications: 3 mL lidocaine 1 %; 6 mL bupivacaine 0.25 %; 80 mg methylPREDNISolone acetate 40 MG/ML ?Outcome: tolerated well, no immediate complications ?Consent was given by the patient. Patient was prepped and draped in the usual sterile fashion.  ? ? ? ? ?Clinical Data: ?No additional  findings. ? ? ?Subjective: ?Chief Complaint  ?Patient presents with  ? Lower Back - Pain  ? Right Hip - Pain  ? ? ?HPI ?66 year old white female comes in today with complaints of low back pain and right hip pain.  Patient says she has had some pain into the low back but her worst problem is centered around her right hip where she has pain into the groin and also lateral hip.  Groin pain worse when she is ambulating and with hip rotation and flexion.  Lateral right hip pain when she lays on her side.  Not really describing a true lumbar radicular component. ?Review of Systems ?No current cardiopulmonary GI/GU issues ? ?Objective: ?Vital Signs: LMP 03/02/2011 (Approximate)  ? ?Physical Exam ?Constitutional:   ?   Comments: Extremely pleasant female alert and oriented in no acute stress.  ?HENT:  ?   Head: Normocephalic and atraumatic.  ?Eyes:  ?   Extraocular Movements: Extraocular movements intact.  ?Pulmonary:  ?   Effort: No respiratory distress.  ?Musculoskeletal:  ?   Comments: Gait is antalgic.  No lumbar paraspinal tenderness.  Patient has moderate to marked right lateral hip tenderness over the greater trochanter bursa.  Moderate right groin pain with about 10 to 15 degrees internal/external rotation.  Left hip is unremarkable.  Negative straight leg raise.  No focal motor deficits.  ?Neurological:  ?   Mental  Status: She is alert.  ?Psychiatric:     ?   Mood and Affect: Mood normal.  ? ? ?Ortho Exam ? ?Specialty Comments:  ?No specialty comments available. ? ?Imaging: ?No results found. ? ? ?PMFS History: ?Patient Active Problem List  ? Diagnosis Date Noted  ? HSV (herpes simplex virus) infection 06/03/2021  ? History of loop electrical excision procedure (LEEP) 06/03/2021  ? Abnormal TSH 06/06/2020  ? Post herpetic neuralgia 10/27/2017  ? Hyperglycemia 01/31/2016  ? Depression with anxiety 01/18/2015  ? Sinusitis 12/05/2013  ? Hyperlipidemia, mixed 05/20/2013  ? Overweight 10/11/2010  ? UNSPECIFIED ANEMIA  10/11/2010  ? ELEVATED BP READING WITHOUT DX HYPERTENSION 10/11/2010  ? HEART MURMUR, HX OF 10/11/2010  ? CROUP, HX OF 10/11/2010  ? ?Past Medical History:  ?Diagnosis Date  ? Abnormal Pap smear   ? CIN I-LEEP  ? Allergy   ? Anemia   ? borderline   ? Arthritis   ? back   ? Depression   ? h/o depression/anxiety  ? Depression with anxiety 01/18/2015  ? GERD (gastroesophageal reflux disease)   ? HSV infection   ? Hyperglycemia 01/31/2016  ? Other and unspecified hyperlipidemia 05/20/2013  ? Stress fracture 2008  ? left foot  ? Trigger finger of left hand   ? ring finger  ?  ?Family History  ?Problem Relation Age of Onset  ? Other Mother   ?     CHF  ? Diabetes Mother   ?     Borderline  ? Hypertension Mother   ? Obesity Mother   ? Anxiety disorder Mother   ? Other Father   ?     CHF  ? Obesity Sister   ? Diabetes Sister   ?     type 2  ? Hypertension Sister   ? Anxiety disorder Sister   ? Diverticulosis Sister   ? Hyperlipidemia Sister   ? Obesity Sister   ? Anxiety disorder Sister   ? Fibrocystic breast disease Sister   ? Thyroid disease Sister   ? Colon polyps Sister   ? Obesity Sister   ? Breast cancer Maternal Aunt   ? Cancer Maternal Aunt   ?     breast  ? Breast cancer Paternal Aunt   ? Cancer Paternal Aunt   ?     Breast  ? Obesity Paternal Grandmother   ? Other Paternal Grandfather   ?     Committed suicide during the [place] Depression  ? Breast cancer Cousin   ? Cancer Cousin   ?     recurrent metastatic, 10 year span  ? Obesity Brother   ? Anxiety disorder Brother   ? Colon cancer Neg Hx   ? Esophageal cancer Neg Hx   ? Rectal cancer Neg Hx   ? Stomach cancer Neg Hx   ?  ?Past Surgical History:  ?Procedure Laterality Date  ? ANKLE SURGERY Left 07/2016  ? BREAST BIOPSY Left 2018  ? COLONOSCOPY    ? last 2011 DB normal   ? FOOT SURGERY Left 04/15/14  ? left foot-bone spur, Dr. Sharol Given; second surgery 01/2015 to correct prior surgery  ? FOOT SURGERY Left 5/16  ? Dr Beckey Downing at [facility]  ? HAMMER TOE SURGERY  10+ yrs  ? left foot   ? LEEP  15+ yrs  ? normal paps since then  ? REFRACTIVE SURGERY  5 yrs ago  ? b/l  ? WISDOM TOOTH EXTRACTION    ? ?Social History  ? ?  Occupational History  ? Not on file  ?Tobacco Use  ? Smoking status: Former  ?  Packs/day: 0.25  ?  Types: Cigarettes  ?  Quit date: 09/13/1983  ?  Years since quitting: 38.2  ? Smokeless tobacco: Never  ?Vaping Use  ? Vaping Use: Never used  ?Substance and Sexual Activity  ? Alcohol use: Yes  ?  Alcohol/week: 14.0 standard drinks  ?  Types: 14 Glasses of wine per week  ? Drug use: No  ? Sexual activity: Yes  ?  Partners: Male  ?  Birth control/protection: None, Post-menopausal  ?  Comment: no dietary restrictions, lives with husband, 4 dogs  ? ? ? ? ? ? ?

## 2021-12-02 NOTE — Progress Notes (Signed)
? ?Subjective:  ? ? Patient ID: Breanna Bridges, female    DOB: 1956/05/07, 66 y.o.   MRN: [id number] ? ?Chief Complaint  ?Patient presents with  ? Medicare Wellness  ? ? ?HPI ?Patient is in today for her initial medicare wellness visit and follow up on chronic medical concerns. She is doing well. No recent febrile illness or hospitalizations. She is trying to stay active and maintain a heart healthy diet. She is getting adequate sleep and tries to hydrate well. She denies any acute concerns. Denies CP/palp/SOB/HA/congestion/fevers/GI or GU c/o. Taking meds as prescribed  ? ?Past Medical History:  ?Diagnosis Date  ? Abnormal Pap smear   ? CIN I-LEEP  ? Allergy   ? Anemia   ? borderline   ? Arthritis   ? back   ? Cataract 2/23  ? Depression   ? h/o depression/anxiety  ? Depression with anxiety 01/18/2015  ? GERD (gastroesophageal reflux disease)   ? HSV infection   ? Hyperglycemia 01/31/2016  ? Other and unspecified hyperlipidemia 05/20/2013  ? Stress fracture 2008  ? left foot  ? Trigger finger of left hand   ? ring finger  ? ? ?Past Surgical History:  ?Procedure Laterality Date  ? ANKLE SURGERY Left 07/2016  ? BREAST BIOPSY Left 2018  ? COLONOSCOPY    ? last 2011 DB normal   ? EYE SURGERY  2005  ? Lasik  ? FOOT SURGERY Left 04/15/2014  ? left foot-bone spur, Dr. Sharol Given; second surgery 01/2015 to correct prior surgery  ? FOOT SURGERY Left 01/2015  ? Dr Beckey Downing at [facility]  ? HAMMER TOE SURGERY  10+ yrs  ? left foot  ? LEEP  15+ yrs  ? normal paps since then  ? REFRACTIVE SURGERY  5 yrs ago  ? b/l  ? WISDOM TOOTH EXTRACTION    ? ? ?Family History  ?Problem Relation Age of Onset  ? Other Mother   ?     CHF  ? Diabetes Mother   ?     Borderline  ? Hypertension Mother   ? Obesity Mother   ? Anxiety disorder Mother   ? Heart disease Mother   ? Other Father   ?     CHF  ? Obesity Sister   ? Diabetes Sister   ?     type 2  ? Hypertension Sister   ? Anxiety disorder Sister   ? Diverticulosis Sister   ? Hyperlipidemia Sister   ?  Arthritis Sister   ? Obesity Sister   ? Anxiety disorder Sister   ? Fibrocystic breast disease Sister   ? Thyroid disease Sister   ? Colon polyps Sister   ? Obesity Sister   ? Breast cancer Maternal Aunt   ? Cancer Maternal Aunt   ?     breast  ? Breast cancer Paternal Aunt   ? Cancer Paternal Aunt   ?     Breast  ? Obesity Paternal Grandmother   ? Other Paternal Grandfather   ?     Committed suicide during the [place] Depression  ? Breast cancer Cousin   ? Cancer Cousin   ?     recurrent metastatic, 10 year span  ? Obesity Brother   ? Anxiety disorder Brother   ? Arthritis Brother   ? Colon cancer Neg Hx   ? Esophageal cancer Neg Hx   ? Rectal cancer Neg Hx   ? Stomach cancer Neg Hx   ? ? ?Social  History  ? ?Socioeconomic History  ? Marital status: Married  ?  Spouse name: Not on file  ? Number of children: Not on file  ? Years of education: Not on file  ? Highest education level: Not on file  ?Occupational History  ? Not on file  ?Tobacco Use  ? Smoking status: Former  ?  Packs/day: 0.25  ?  Types: Cigarettes  ?  Quit date: 09/13/1983  ?  Years since quitting: 38.2  ? Smokeless tobacco: Never  ?Vaping Use  ? Vaping Use: Never used  ?Substance and Sexual Activity  ? Alcohol use: Yes  ?  Alcohol/week: 14.0 standard drinks  ?  Types: 14 Glasses of wine per week  ? Drug use: No  ? Sexual activity: Yes  ?  Partners: Male  ?  Birth control/protection: Post-menopausal, None  ?  Comment: no dietary restrictions, lives with husband, 4 dogs  ?Other Topics Concern  ? Not on file  ?Social History Narrative  ? Lives with Obie Dredge, husband, no major dietary restrictions, works in Pharmacologist, Presenter, broadcasting, Associate Professor  ? ?Social Determinants of Health  ? ?Financial Resource Strain: Not on file  ?Food Insecurity: Not on file  ?Transportation Needs: No Transportation Needs  ? Lack of Transportation (Medical): No  ? Lack of Transportation (Non-Medical): No  ?Physical Activity: Not on file  ?Stress: Not on file  ?Social Connections: Not on  file  ?Intimate Partner Violence: Not on file  ? ? ?Outpatient Medications Prior to Visit  ?Medication Sig Dispense Refill  ? aspirin 81 MG tablet Take 81 mg by mouth daily.      ? Calcium Carb-Cholecalciferol (CALCIUM 500 + D3 PO) Take by mouth.    ? cetirizine (ZYRTEC) 10 MG tablet Take 10 mg by mouth daily.      ? clobetasol ointment (TEMOVATE) 0.05 % as needed.     ? famciclovir (FAMVIR) 250 MG tablet Take 1 tablet (250 mg total) by mouth 2 (two) times daily. 180 tablet 4  ? fluticasone (FLONASE) 50 MCG/ACT nasal spray SPRAY ONCE INTO EACH NOSTRIL DAILY. IN [place], SUMMER, AND FALL 48 mL 3  ? Krill Oil 500 MG CAPS Take by mouth once.    ? multivitamin (THERAGRAN) per tablet Take 1 tablet by mouth daily.      ? pantoprazole (PROTONIX) 40 MG tablet TAKE 1 TABLET BY MOUTH EVERY DAY 90 tablet 3  ? Probiotic Product (PROBIOTIC PO) Take by mouth daily.    ? venlafaxine XR (EFFEXOR-XR) 75 MG 24 hr capsule TAKE ONE CAPSULE BY MOUTH EVERY DAY WITH BREAKFAST 90 capsule 2  ? Zoster Vaccine Adjuvanted [facility]) injection Inject into the muscle. 1 each 1  ? acetaminophen (TYLENOL) 500 MG tablet Take 500 mg by mouth every 6 (six) hours as needed.    ? COVID-19 mRNA bivalent vaccine, Pfizer, (PFIZER COVID-19 VAC BIVALENT) injection Inject into the muscle. 0.3 mL 0  ? guaiFENesin 200 MG tablet Take 200 mg by mouth every 4 (four) hours as needed for cough or to loosen phlegm.    ? promethazine-dextromethorphan (PROMETHAZINE-DM) 6.25-15 MG/5ML syrup Take 5 mLs by mouth 4 (four) times daily as needed for cough. 118 mL 0  ? ?No facility-administered medications prior to visit.  ? ? ?Allergies  ?Allergen Reactions  ? Codeine   ?  REACTION: nausea  ? ? ?Review of Systems  ?Constitutional:  Negative for chills, fever and malaise/fatigue.  ?HENT:  Negative for congestion and hearing loss.   ?Eyes:  Negative  for discharge.  ?Respiratory:  Negative for cough, sputum production and shortness of breath.   ?Cardiovascular:  Negative for  chest pain, palpitations and leg swelling.  ?Gastrointestinal:  Negative for abdominal pain, blood in stool, constipation, diarrhea, heartburn, nausea and vomiting.  ?Genitourinary:  Negative for dysuria, frequency, hematuria and urgency.  ?Musculoskeletal:  Positive for joint pain. Negative for back pain, falls and myalgias.  ?Skin:  Negative for rash.  ?Neurological:  Negative for dizziness, sensory change, loss of consciousness, weakness and headaches.  ?Endo/Heme/Allergies:  Negative for environmental allergies. Does not bruise/bleed easily.  ?Psychiatric/Behavioral:  Negative for depression and suicidal ideas. The patient is not nervous/anxious and does not have insomnia.   ? ?   ?Objective:  ?  ?Physical Exam ?Constitutional:   ?   General: She is not in acute distress. ?   Appearance: She is well-developed.  ?HENT:  ?   Head: Normocephalic and atraumatic.  ?Eyes:  ?   Conjunctiva/sclera: Conjunctivae normal.  ?Neck:  ?   Thyroid: No thyromegaly.  ?Cardiovascular:  ?   Rate and Rhythm: Normal rate and regular rhythm.  ?   Heart sounds: Normal heart sounds. No murmur heard. ?Pulmonary:  ?   Effort: Pulmonary effort is normal. No respiratory distress.  ?   Breath sounds: Normal breath sounds.  ?Abdominal:  ?   General: Bowel sounds are normal. There is no distension.  ?   Palpations: Abdomen is soft. There is no mass.  ?   Tenderness: There is no abdominal tenderness.  ?Musculoskeletal:  ?   Cervical back: Neck supple.  ?Lymphadenopathy:  ?   Cervical: No cervical adenopathy.  ?Skin: ?   General: Skin is warm and dry.  ?Neurological:  ?   Mental Status: She is alert and oriented to person, place, and time.  ?Psychiatric:     ?   Behavior: Behavior normal.  ? ? ?BP 138/82 (BP Location: Right Arm, Patient Position: Sitting, Cuff Size: Normal)   Pulse 80   Resp 20   Ht '5\' 1"'$  (1.549 m)   Wt 189 lb 3.2 oz (85.8 kg)   LMP 03/02/2011 (Approximate)   SpO2 98%   BMI 35.75 kg/m?  ?Wt Readings from Last 3  Encounters:  ?12/03/21 189 lb 3.2 oz (85.8 kg)  ?05/31/21 191 lb 6.4 oz (86.8 kg)  ?05/11/21 191 lb 9.6 oz (86.9 kg)  ? ? ?Diabetic Foot Exam - Simple   ?No data filed ?  ? ?Lab Results  ?Component Value Date  ? WBC 5.0 08/

## 2021-12-03 ENCOUNTER — Ambulatory Visit (INDEPENDENT_AMBULATORY_CARE_PROVIDER_SITE_OTHER): Payer: Medicare Other | Admitting: Family Medicine

## 2021-12-03 ENCOUNTER — Other Ambulatory Visit (HOSPITAL_BASED_OUTPATIENT_CLINIC_OR_DEPARTMENT_OTHER): Payer: Self-pay

## 2021-12-03 ENCOUNTER — Encounter: Payer: Self-pay | Admitting: Family Medicine

## 2021-12-03 VITALS — BP 138/82 | HR 80 | Resp 20 | Ht 61.0 in | Wt 189.2 lb

## 2021-12-03 DIAGNOSIS — R739 Hyperglycemia, unspecified: Secondary | ICD-10-CM

## 2021-12-03 DIAGNOSIS — D649 Anemia, unspecified: Secondary | ICD-10-CM | POA: Diagnosis not present

## 2021-12-03 DIAGNOSIS — R7989 Other specified abnormal findings of blood chemistry: Secondary | ICD-10-CM | POA: Diagnosis not present

## 2021-12-03 DIAGNOSIS — E2839 Other primary ovarian failure: Secondary | ICD-10-CM

## 2021-12-03 DIAGNOSIS — Z0000 Encounter for general adult medical examination without abnormal findings: Secondary | ICD-10-CM | POA: Diagnosis not present

## 2021-12-03 DIAGNOSIS — E663 Overweight: Secondary | ICD-10-CM

## 2021-12-03 DIAGNOSIS — M25551 Pain in right hip: Secondary | ICD-10-CM | POA: Diagnosis not present

## 2021-12-03 DIAGNOSIS — E782 Mixed hyperlipidemia: Secondary | ICD-10-CM | POA: Diagnosis not present

## 2021-12-03 DIAGNOSIS — L578 Other skin changes due to chronic exposure to nonionizing radiation: Secondary | ICD-10-CM

## 2021-12-03 DIAGNOSIS — Z780 Asymptomatic menopausal state: Secondary | ICD-10-CM

## 2021-12-03 LAB — COMPREHENSIVE METABOLIC PANEL
ALT: 14 U/L (ref 0–35)
AST: 17 U/L (ref 0–37)
Albumin: 4.7 g/dL (ref 3.5–5.2)
Alkaline Phosphatase: 96 U/L (ref 39–117)
BUN: 27 mg/dL — ABNORMAL HIGH (ref 6–23)
CO2: 29 mEq/L (ref 19–32)
Calcium: 9.4 mg/dL (ref 8.4–10.5)
Chloride: 101 mEq/L (ref 96–112)
Creatinine, Ser: 0.68 mg/dL (ref 0.40–1.20)
GFR: 91.32 mL/min (ref >60.00)
Glucose, Bld: 96 mg/dL (ref 70–99)
Potassium: 4 mEq/L (ref 3.5–5.1)
Sodium: 138 mEq/L (ref 135–145)
Total Bilirubin: 0.5 mg/dL (ref 0.2–1.2)
Total Protein: 6.9 g/dL (ref 6.0–8.3)

## 2021-12-03 LAB — CBC
HCT: 37.2 % (ref 36.0–46.0)
Hemoglobin: 12.5 g/dL (ref 12.0–15.0)
MCHC: 33.6 g/dL (ref 30.0–36.0)
MCV: 90.2 fl (ref 78.0–100.0)
Platelets: 231 10*3/uL (ref 150.0–400.0)
RBC: 4.13 Mil/uL (ref 3.87–5.11)
RDW: 14 % (ref 11.5–15.5)
WBC: 6.8 10*3/uL (ref 4.0–10.5)

## 2021-12-03 LAB — LIPID PANEL
Cholesterol: 223 mg/dL — ABNORMAL HIGH (ref 0–200)
HDL: 102.8 mg/dL (ref >39.00)
LDL Cholesterol: 105 mg/dL — ABNORMAL HIGH (ref 0–99)
NonHDL: 120.1
Total CHOL/HDL Ratio: 2
Triglycerides: 78 mg/dL (ref 0.0–149.0)
VLDL: 15.6 mg/dL (ref 0.0–40.0)

## 2021-12-03 LAB — HEMOGLOBIN A1C: Hgb A1c MFr Bld: 5.4 % (ref 4.6–6.5)

## 2021-12-03 NOTE — Patient Instructions (Signed)
Preventive Care 34 Years and Older, Female ?Preventive care refers to lifestyle choices and visits with your health care provider that can promote health and wellness. Preventive care visits are also called wellness exams. ?What can I expect for my preventive care visit? ?Counseling ?Your health care provider may ask you questions about your: ?Medical history, including: ?Past medical problems. ?Family medical history. ?Pregnancy and menstrual history. ?History of falls. ?Current health, including: ?Memory and ability to understand (cognition). ?Emotional well-being. ?Home life and relationship well-being. ?Sexual activity and sexual health. ?Lifestyle, including: ?Alcohol, nicotine or tobacco, and drug use. ?Access to firearms. ?Diet, exercise, and sleep habits. ?Work and work Statistician. ?Sunscreen use. ?Safety issues such as seatbelt and bike helmet use. ?Physical exam ?Your health care provider will check your: ?Height and weight. These may be used to calculate your BMI (body mass index). BMI is a measurement that tells if you are at a healthy weight. ?Waist circumference. This measures the distance around your waistline. This measurement also tells if you are at a healthy weight and may help predict your risk of certain diseases, such as type 2 diabetes and high blood pressure. ?Heart rate and blood pressure. ?Body temperature. ?Skin for abnormal spots. ?What immunizations do I need? ?Vaccines are usually given at various ages, according to a schedule. Your health care provider will recommend vaccines for you based on your age, medical history, and lifestyle or other factors, such as travel or where you work. ?What tests do I need? ?Screening ?Your health care provider may recommend screening tests for certain conditions. This may include: ?Lipid and cholesterol levels. ?Hepatitis C test. ?Hepatitis B test. ?HIV (human immunodeficiency virus) test. ?STI (sexually transmitted infection) testing, if you are at  risk. ?Lung cancer screening. ?Colorectal cancer screening. ?Diabetes screening. This is done by checking your blood sugar (glucose) after you have not eaten for a while (fasting). ?Mammogram. Talk with your health care provider about how often you should have regular mammograms. ?BRCA-related cancer screening. This may be done if you have a family history of breast, ovarian, tubal, or peritoneal cancers. ?Bone density scan. This is done to screen for osteoporosis. ?Talk with your health care provider about your test results, treatment options, and if necessary, the need for more tests. ?Follow these instructions at home: ?Eating and drinking ? ?Eat a diet that includes fresh fruits and vegetables, whole grains, lean protein, and low-fat dairy products. Limit your intake of foods with high amounts of sugar, saturated fats, and salt. ?Take vitamin and mineral supplements as recommended by your health care provider. ?Do not drink alcohol if your health care provider tells you not to drink. ?If you drink alcohol: ?Limit how much you have to 0-1 drink a day. ?Know how much alcohol is in your drink. In the [place] one drink equals one 12 oz bottle of beer (355 mL), one 5 oz glass of wine (148 mL), or one 1? oz glass of hard liquor (44 mL). ?Lifestyle ?Brush your teeth every morning and night with fluoride toothpaste. Floss one time each day. ?Exercise for at least 30 minutes 5 or more days each week. ?Do not use any products that contain nicotine or tobacco. These products include cigarettes, chewing tobacco, and vaping devices, such as e-cigarettes. If you need help quitting, ask your health care provider. ?Do not use drugs. ?If you are sexually active, practice safe sex. Use a condom or other form of protection in order to prevent STIs. ?Take aspirin only as told by your  health care provider. Make sure that you understand how much to take and what form to take. Work with your health care provider to find out whether it  is safe and beneficial for you to take aspirin daily. ?Ask your health care provider if you need to take a cholesterol-lowering medicine (statin). ?Find healthy ways to manage stress, such as: ?Meditation, yoga, or listening to music. ?Journaling. ?Talking to a trusted person. ?Spending time with friends and family. ?Minimize exposure to UV radiation to reduce your risk of skin cancer. ?Safety ?Always wear your seat belt while driving or riding in a vehicle. ?Do not drive: ?If you have been drinking alcohol. Do not ride with someone who has been drinking. ?When you are tired or distracted. ?While texting. ?If you have been using any mind-altering substances or drugs. ?Wear a helmet and other protective equipment during sports activities. ?If you have firearms in your house, make sure you follow all gun safety procedures. ?What's next? ?Visit your health care provider once a year for an annual wellness visit. ?Ask your health care provider how often you should have your eyes and teeth checked. ?Stay up to date on all vaccines. ?This information is not intended to replace advice given to you by your health care provider. Make sure you discuss any questions you have with your health care provider. ?Document Revised: 02/24/2021 Document Reviewed: 02/24/2021 ?Elsevier Patient Education ? [place]. ? ?

## 2021-12-05 DIAGNOSIS — L578 Other skin changes due to chronic exposure to nonionizing radiation: Secondary | ICD-10-CM | POA: Insufficient documentation

## 2021-12-05 DIAGNOSIS — M25551 Pain in right hip: Secondary | ICD-10-CM | POA: Insufficient documentation

## 2021-12-05 NOTE — Assessment & Plan Note (Signed)
Encouraged DASH or MIND diet, decrease po intake and increase exercise as tolerated. Needs 7-8 hours of sleep nightly. Avoid trans fats, eat small, frequent meals every 4-5 hours with lean proteins, complex carbs and healthy fats. Minimize simple carbs, high fat foods and processed foods 

## 2021-12-05 NOTE — Assessment & Plan Note (Signed)
Continue to monitor

## 2021-12-05 NOTE — Assessment & Plan Note (Signed)
hgba1c acceptable, minimize simple carbs. Increase exercise as tolerated.  

## 2021-12-05 NOTE — Assessment & Plan Note (Signed)
Referred to dermatology for further evaluation.

## 2021-12-05 NOTE — Assessment & Plan Note (Signed)
Encouraged moist heat and gentle stretching as tolerated. May try NSAIDs and prescription meds as directed and report if symptoms worsen or seek immediate care. Follows with [facility] Dr Benjiman Core.  ?

## 2021-12-05 NOTE — Assessment & Plan Note (Signed)
Encourage heart healthy diet such as MIND or DASH diet, increase exercise, avoid trans fats, simple carbohydrates and processed foods, consider a krill or fish or flaxseed oil cap daily.  °

## 2021-12-05 NOTE — Assessment & Plan Note (Addendum)
Patient denies any difficulties at home. No trouble with ADLs, depression or falls. See EMR for functional status screen and depression screen. No recent changes to vision or hearing. Is UTD with immunizations. Is UTD with screening. Discussed Advanced Directives. Encouraged heart healthy diet, exercise as tolerated and adequate sleep. ?See patient's problem list for health risk factors to monitor. ?See AVS for preventative healthcare recommendation schedule. ?Labs ordered and reviewed, mgm done 11/17/21 repeat in 1 year. Dexa 2019 repeat Dexa ordered. Colonoscopy 01/09/20 repeat in 5 years ?

## 2021-12-06 ENCOUNTER — Ambulatory Visit (INDEPENDENT_AMBULATORY_CARE_PROVIDER_SITE_OTHER): Payer: Medicare Other | Admitting: Orthopaedic Surgery

## 2021-12-06 VITALS — Ht 61.0 in | Wt 189.2 lb

## 2021-12-06 DIAGNOSIS — M25559 Pain in unspecified hip: Secondary | ICD-10-CM

## 2021-12-06 DIAGNOSIS — M1611 Unilateral primary osteoarthritis, right hip: Secondary | ICD-10-CM | POA: Diagnosis not present

## 2021-12-06 NOTE — Progress Notes (Signed)
The patient is a very pleasant 66 year old female who is referred from one of our physician assistants to evaluate and treat known severe arthritis of the right hip.  She has gotten into swimming and is trying to work on weight loss and activity modification.  However her right hip is been hurting for over a year now getting worse slowly.  She has had an injection on the trochanteric area over the hip to try to calm down the bursitis but most of her pain is in the groin.  She has had a harder time crossing her legs and putting her shoes and socks on that side.  At this point her right hip pain is detrimentally affecting her mobility, her quality of life and actives daily living.  I was able to review her x-rays with her of her pelvis and right hip.  She denies any headache, chest pain, shortness of breath, fever, chills, nausea, vomiting.  I reviewed her medical status and medications in epic. ? ?Examination her right hip shows significant pain with internal and external rotation and significant stiffness with rotation.  Her left hip exam is entirely normal. ? ?Standing AP pelvis and lateral the right hip shows severe end-stage arthritis of the right hip.  There are large osteophytes around the femoral head and neck and significant joint space narrowing. ? ?At this point we have recommended a right hip replacement.  She would like to have this scheduled in June.  We discussed the risks and benefits of hip replacement surgery.  I talked about what to expect from an intraoperative and postoperative course and gave her handout about joint replacement surgery.  All questions and concerns were answered and addressed.  We will work on getting this scheduled. ?

## 2021-12-07 LAB — T4, FREE: Free T4: 0.79 ng/dL (ref 0.60–1.60)

## 2021-12-07 LAB — TSH: TSH: 1.67 u[IU]/mL (ref 0.35–5.50)

## 2021-12-10 MED ORDER — LIDOCAINE HCL 1 % IJ SOLN
3.0000 mL | INTRAMUSCULAR | Status: AC | PRN
  Administered 2021-11-25: 3 mL

## 2021-12-10 MED ORDER — BUPIVACAINE HCL 0.25 % IJ SOLN
6.0000 mL | INTRAMUSCULAR | Status: AC | PRN
  Administered 2021-11-25: 6 mL via INTRA_ARTICULAR

## 2021-12-10 MED ORDER — METHYLPREDNISOLONE ACETATE 40 MG/ML IJ SUSP
80.0000 mg | INTRAMUSCULAR | Status: AC | PRN
  Administered 2021-11-25: 80 mg via INTRA_ARTICULAR

## 2021-12-13 DIAGNOSIS — H2513 Age-related nuclear cataract, bilateral: Secondary | ICD-10-CM | POA: Diagnosis not present

## 2021-12-13 DIAGNOSIS — H25013 Cortical age-related cataract, bilateral: Secondary | ICD-10-CM | POA: Diagnosis not present

## 2021-12-13 DIAGNOSIS — H43313 Vitreous membranes and strands, bilateral: Secondary | ICD-10-CM | POA: Diagnosis not present

## 2021-12-13 DIAGNOSIS — H18413 Arcus senilis, bilateral: Secondary | ICD-10-CM | POA: Diagnosis not present

## 2021-12-13 DIAGNOSIS — H2511 Age-related nuclear cataract, right eye: Secondary | ICD-10-CM | POA: Diagnosis not present

## 2021-12-23 ENCOUNTER — Ambulatory Visit (HOSPITAL_BASED_OUTPATIENT_CLINIC_OR_DEPARTMENT_OTHER)
Admission: RE | Admit: 2021-12-23 | Discharge: 2021-12-23 | Disposition: A | Discharge status: Home / Self Care | Payer: Medicare Other | Source: Ambulatory Visit | Attending: Family Medicine | Admitting: Family Medicine

## 2021-12-23 DIAGNOSIS — E2839 Other primary ovarian failure: Secondary | ICD-10-CM | POA: Diagnosis present

## 2021-12-23 DIAGNOSIS — Z780 Asymptomatic menopausal state: Secondary | ICD-10-CM | POA: Diagnosis not present

## 2022-01-06 ENCOUNTER — Other Ambulatory Visit: Payer: Self-pay

## 2022-01-18 DIAGNOSIS — H2511 Age-related nuclear cataract, right eye: Secondary | ICD-10-CM | POA: Diagnosis not present

## 2022-01-18 DIAGNOSIS — H2512 Age-related nuclear cataract, left eye: Secondary | ICD-10-CM | POA: Diagnosis not present

## 2022-01-18 DIAGNOSIS — H25811 Combined forms of age-related cataract, right eye: Secondary | ICD-10-CM | POA: Diagnosis not present

## 2022-01-25 DIAGNOSIS — H2511 Age-related nuclear cataract, right eye: Secondary | ICD-10-CM | POA: Diagnosis not present

## 2022-01-25 DIAGNOSIS — H25812 Combined forms of age-related cataract, left eye: Secondary | ICD-10-CM | POA: Diagnosis not present

## 2022-01-25 DIAGNOSIS — H2512 Age-related nuclear cataract, left eye: Secondary | ICD-10-CM | POA: Diagnosis not present

## 2022-01-26 DIAGNOSIS — H04122 Dry eye syndrome of left lacrimal gland: Secondary | ICD-10-CM | POA: Diagnosis not present

## 2022-02-15 ENCOUNTER — Encounter: Payer: Self-pay | Admitting: Orthopaedic Surgery

## 2022-02-16 ENCOUNTER — Other Ambulatory Visit: Payer: Self-pay | Admitting: Physician Assistant

## 2022-02-16 DIAGNOSIS — M1611 Unilateral primary osteoarthritis, right hip: Secondary | ICD-10-CM

## 2022-02-18 NOTE — Patient Instructions (Signed)
DUE TO COVID-19 ONLY TWO VISITORS  (aged 66 and older)  ARE ALLOWED TO COME WITH YOU AND STAY IN THE WAITING ROOM ONLY DURING PRE OP AND PROCEDURE.   **NO VISITORS ARE ALLOWED IN THE SHORT STAY AREA OR RECOVERY ROOM!!**  IF YOU WILL BE ADMITTED INTO THE HOSPITAL YOU ARE ALLOWED ONLY FOUR SUPPORT PEOPLE DURING VISITATION HOURS ONLY (7 AM -8PM)   The support person(s) must pass our screening, gel in and out, and wear a mask at all times, including in the patient's room. Patients must also wear a mask when staff or their support person are in the room. Visitors GUEST BADGE MUST BE WORN VISIBLY  One adult visitor may remain with you overnight and MUST be in the room by 8 P.M.     Your procedure is scheduled on: 02/25/22   Report to [facility] Main Entrance    Report to admitting at   8:10 AM   Call this number if you have problems the morning of surgery [phone]   Do not eat food :After Midnight.   After Midnight you may have the following liquids until _7:30 _AM/  DAY OF SURGERY  Water Black Coffee (sugar ok, NO MILK/CREAM OR CREAMERS)  Tea (sugar ok, NO MILK/CREAM OR CREAMERS) regular and decaf                             Plain Jell-O (NO RED)                                           Fruit ices (not with fruit pulp, NO RED)                                     Popsicles (NO RED)                                                                  Juice: apple, WHITE grape, WHITE cranberry Sports drinks like Gatorade (NO RED) Clear broth(vegetable,chicken,beef)      The day of surgery:  Drink ONE (1) Pre-Surgery Clear Ensure  at  7:15 AM the morning of surgery. Drink in one sitting. Do not sip.  This drink was given to you during your hospital  pre-op appointment visit. Nothing else to drink after completing the  Pre-Surgery Clear Ensure at 7:30 AM          If you have questions, please contact your surgeon's office.      Oral Hygiene is also important to reduce  your risk of infection.                                    Remember - BRUSH YOUR TEETH THE MORNING OF SURGERY WITH YOUR REGULAR TOOTHPASTE   Do NOT smoke after Midnight   Take these medicines the morning of surgery with A SIP OF WATER: Venlafaxine, Zyrtec, Famciclovir  Bring CPAP mask and tubing day of surgery.  You may not have any metal on your body including hair pins, jewelry, and body piercing             Do not wear make-up, lotions, powders, perfumes/cologne, or deodorant  Do not wear nail polish including gel and S&S, artificial/acrylic nails, or any other type of covering on natural nails including finger and toenails. If you have artificial nails, gel coating, etc. that needs to be removed by a nail salon please have this removed prior to surgery or surgery may need to be canceled/ delayed if the surgeon/ anesthesia feels like they are unable to be safely monitored.   Do not shave  48 hours prior to surgery.    Do not bring valuables to the hospital. [place].   Contacts, dentures or bridgework may not be worn into surgery.   Bring small overnight bag day of surgery.   DO NOT [place]. PHARMACY WILL DISPENSE MEDICATIONS LISTED ON YOUR MEDICATION LIST TO YOU DURING YOUR ADMISSION [place]!       Special Instructions: Bring a copy of your healthcare power of attorney and living will documents the day of surgery if you haven't scanned them before.              Please read over the following fact sheets you were given: IF YOU HAVE QUESTIONS ABOUT YOUR PRE-OP INSTRUCTIONS PLEASE CALL [phone]     [facility] - Preparing for Surgery Before surgery, you can play an important role.  Because skin is not sterile, your skin needs to be as free of germs as possible.  You can reduce the number of germs on your skin by washing with CHG (chlorahexidine gluconate)  soap before surgery.  CHG is an antiseptic cleaner which kills germs and bonds with the skin to continue killing germs even after washing. Please DO NOT use if you have an allergy to CHG or antibacterial soaps.  If your skin becomes reddened/irritated stop using the CHG and inform your nurse when you arrive at Short Stay. Do not shave (including legs and underarms) for at least 48 hours prior to the first CHG shower.   Please follow these instructions carefully:  1.  Shower with CHG Soap the night before surgery and the  morning of Surgery.  2.  If you choose to wash your hair, wash your hair first as usual with your  normal  shampoo.  3.  After you shampoo, rinse your hair and body thoroughly to remove the  shampoo.                            4.  Use CHG as you would any other liquid soap.  You can apply chg directly  to the skin and wash                       Gently with a scrungie or clean washcloth.  5.  Apply the CHG Soap to your body ONLY FROM THE NECK DOWN.   Do not use on face/ open                           Wound or open sores. Avoid contact with eyes, ears mouth and genitals (private parts).  Wash face,  Genitals (private parts) with your normal soap.             6.  Wash thoroughly, paying special attention to the area where your surgery  will be performed.  7.  Thoroughly rinse your body with warm water from the neck down.  8.  DO NOT shower/wash with your normal soap after using and rinsing off  the CHG Soap.                9.  Pat yourself dry with a clean towel.            10.  Wear clean pajamas.            11.  Place clean sheets on your bed the night of your first shower and do not  sleep with pets. Day of Surgery : Do not apply any lotions/deodorants the morning of surgery.  Please wear clean clothes to the hospital/surgery center.  FAILURE TO FOLLOW THESE INSTRUCTIONS MAY RESULT IN THE CANCELLATION OF YOUR SURGERY PATIENT  SIGNATURE_________________________________  NURSE SIGNATURE__________________________________  ________________________________________________________________________   Breanna Bridges  An incentive spirometer is a tool that can help keep your lungs clear and active. This tool measures how well you are filling your lungs with each breath. Taking long deep breaths may help reverse or decrease the chance of developing breathing (pulmonary) problems (especially infection) following: A long period of time when you are unable to move or be active. BEFORE THE PROCEDURE  If the spirometer includes an indicator to show your best effort, your nurse or respiratory therapist will set it to a desired goal. If possible, sit up straight or lean slightly forward. Try not to slouch. Hold the incentive spirometer in an upright position. INSTRUCTIONS FOR USE  Sit on the edge of your bed if possible, or sit up as far as you can in bed or on a chair. Hold the incentive spirometer in an upright position. Breathe out normally. Place the mouthpiece in your mouth and seal your lips tightly around it. Breathe in slowly and as deeply as possible, raising the piston or the ball toward the top of the column. Hold your breath for 3-5 seconds or for as long as possible. Allow the piston or ball to fall to the bottom of the column. Remove the mouthpiece from your mouth and breathe out normally. Rest for a few seconds and repeat Steps 1 through 7 at least 10 times every 1-2 hours when you are awake. Take your time and take a few normal breaths between deep breaths. The spirometer may include an indicator to show your best effort. Use the indicator as a goal to work toward during each repetition. After each set of 10 deep breaths, practice coughing to be sure your lungs are clear. If you have an incision (the cut made at the time of surgery), support your incision when coughing by placing a pillow or rolled up towels  firmly against it. Once you are able to get out of bed, walk around indoors and cough well. You may stop using the incentive spirometer when instructed by your caregiver.  RISKS AND COMPLICATIONS Take your time so you do not get dizzy or light-headed. If you are in pain, you may need to take or ask for pain medication before doing incentive spirometry. It is harder to take a deep breath if you are having pain. AFTER USE Rest and breathe slowly and easily. It can be helpful to keep track  of a log of your progress. Your caregiver can provide you with a simple table to help with this. If you are using the spirometer at home, follow these instructions: [place] IF:  You are having difficultly using the spirometer. You have trouble using the spirometer as often as instructed. Your pain medication is not giving enough relief while using the spirometer. You develop fever of 100.5 F (38.1 C) or higher. SEEK IMMEDIATE MEDICAL CARE IF:  You cough up bloody sputum that had not been present before. You develop fever of 102 F (38.9 C) or greater. You develop worsening pain at or near the incision site. MAKE SURE YOU:  Understand these instructions. Will watch your condition. Will get help right away if you are not doing well or get worse. Document Released: 01/09/2007 Document Revised: 11/21/2011 Document Reviewed: 03/12/2007 [facility] Patient Information 2014 [place], [place].   ________________________________________________________________________

## 2022-02-21 ENCOUNTER — Encounter (HOSPITAL_COMMUNITY)
Admission: RE | Admit: 2022-02-21 | Discharge: 2022-02-21 | Disposition: A | Discharge status: Home / Self Care | Payer: Medicare Other | Source: Ambulatory Visit | Attending: Orthopaedic Surgery | Admitting: Orthopaedic Surgery

## 2022-02-21 ENCOUNTER — Encounter (HOSPITAL_COMMUNITY): Payer: Self-pay

## 2022-02-21 ENCOUNTER — Other Ambulatory Visit: Payer: Self-pay

## 2022-02-21 DIAGNOSIS — R030 Elevated blood-pressure reading, without diagnosis of hypertension: Secondary | ICD-10-CM | POA: Insufficient documentation

## 2022-02-21 DIAGNOSIS — Z01818 Encounter for other preprocedural examination: Secondary | ICD-10-CM | POA: Insufficient documentation

## 2022-02-21 DIAGNOSIS — M1611 Unilateral primary osteoarthritis, right hip: Secondary | ICD-10-CM | POA: Insufficient documentation

## 2022-02-21 LAB — BASIC METABOLIC PANEL
Anion gap: 9 (ref 5–15)
BUN: 26 mg/dL — ABNORMAL HIGH (ref 8–23)
CO2: 25 mmol/L (ref 22–32)
Calcium: 9.5 mg/dL (ref 8.9–10.3)
Chloride: 105 mmol/L (ref 98–111)
Creatinine, Ser: 0.62 mg/dL (ref 0.44–1.00)
GFR, Estimated: >60 mL/min (ref >60)
Glucose, Bld: 99 mg/dL (ref 70–99)
Potassium: 4 mmol/L (ref 3.5–5.1)
Sodium: 139 mmol/L (ref 135–145)

## 2022-02-21 LAB — CBC
HCT: 36.3 % (ref 36.0–46.0)
Hemoglobin: 12 g/dL (ref 12.0–15.0)
MCH: 30.4 pg (ref 26.0–34.0)
MCHC: 33.1 g/dL (ref 30.0–36.0)
MCV: 91.9 fL (ref 80.0–100.0)
Platelets: 210 10*3/uL (ref 150–400)
RBC: 3.95 MIL/uL (ref 3.87–5.11)
RDW: 13.2 % (ref 11.5–15.5)
WBC: 5.1 10*3/uL (ref 4.0–10.5)
nRBC: 0 % (ref 0.0–0.2)

## 2022-02-21 LAB — SURGICAL PCR SCREEN
MRSA, PCR: NEGATIVE
Staphylococcus aureus: NEGATIVE

## 2022-02-21 NOTE — Progress Notes (Signed)
Anesthesia note:  Bowel prep reminder:no  PCP - Dr. Frederik Pear Cardiologist -none Other-   Chest x-ray - no EKG - no Stress Test - no ECHO - no Cardiac Cath - NA  Pacemaker/ICD device last checked:NA  Sleep Study - no CPAP -   Pt is pre diabetic-NA Fasting Blood Sugar -  Checks Blood Sugar _____  Blood Thinner:ASA 81 mg/ Dr. Charlett Blake Blood Thinner Instructions: Aspirin Instructions:stop 5 days prior to SOS/ Dr. Ninfa Linden Last Dose:02/19/22  Anesthesia review: no  Patient denies shortness of breath, fever, cough and chest pain at PAT appointment Pt has no SOB with any activities  Patient verbalized understanding of instructions that were given to them at the PAT appointment. Patient was also instructed that they will need to review over the PAT instructions again at home before surgery. yes

## 2022-02-23 ENCOUNTER — Encounter: Payer: Self-pay | Admitting: Orthopaedic Surgery

## 2022-02-24 ENCOUNTER — Telehealth: Payer: Self-pay | Admitting: *Deleted

## 2022-02-24 ENCOUNTER — Encounter (HOSPITAL_COMMUNITY): Payer: Self-pay | Admitting: Orthopaedic Surgery

## 2022-02-24 NOTE — Telephone Encounter (Signed)
Ortho bundle Pre-op call completed. 

## 2022-02-24 NOTE — Care Plan (Signed)
[facility] RNCM call to patient to discuss her upcoming Left total hip arthroplasty with Dr. Ninfa Linden on 02/25/22. She is an Ortho bundle patient through [facility] and is agreeable to case management. She lives with her spouse, who will be assisting at home after discharge. She has a rollator that she feels very comfortable with as well as a 3in1/BSC, toilet riser and elevated toilets. May need RW with 2 wheels if therapy feels appropriate. HHPT referral made to [facility]. Choice provided today. Reviewed all post op care instructions. Will continue to follow for needs.

## 2022-02-24 NOTE — H&P (Signed)
TOTAL HIP ADMISSION H&P  Patient is admitted for right total hip arthroplasty.  Subjective:  Chief Complaint: right hip pain  HPI: Breanna Bridges, 66 y.o. female, has a history of pain and functional disability in the right hip(s) due to arthritis and patient has failed non-surgical conservative treatments for greater than 12 weeks to include NSAID's and/or analgesics, corticosteriod injections, flexibility and strengthening excercises, weight reduction as appropriate, and activity modification.  Onset of symptoms was gradual starting 1 years ago with gradually worsening course since that time.The patient noted no past surgery on the right hip(s).  Patient currently rates pain in the right hip at 10 out of 10 with activity. Patient has night pain, worsening of pain with activity and weight bearing, pain that interfers with activities of daily living, and pain with passive range of motion. Patient has evidence of subchondral sclerosis, periarticular osteophytes, and joint space narrowing by imaging studies. This condition presents safety issues increasing the risk of falls.  There is no current active infection.  Patient Active Problem List   Diagnosis Date Noted   Unilateral primary osteoarthritis, right hip 12/06/2021   Sun-damaged skin 12/05/2021   Right hip pain 12/05/2021   HSV (herpes simplex virus) infection 06/03/2021   History of loop electrical excision procedure (LEEP) 06/03/2021   Abnormal TSH 06/06/2020   Post herpetic neuralgia 10/27/2017   Hyperglycemia 01/31/2016   Depression with anxiety 01/18/2015   Sinusitis 12/05/2013   Hyperlipidemia, mixed 05/20/2013   Medicare annual wellness visit, initial 12/13/2011   Overweight 10/11/2010   UNSPECIFIED ANEMIA 10/11/2010   ELEVATED BP READING WITHOUT DX HYPERTENSION 10/11/2010   HEART MURMUR, HX OF 10/11/2010   CROUP, HX OF 10/11/2010   Past Medical History:  Diagnosis Date   Abnormal Pap smear    CIN I-LEEP   Allergy     Anemia    takes a multi vit   Arthritis    back    Cataract 2/23   Depression with anxiety 01/18/2015   GERD (gastroesophageal reflux disease)    HSV infection    Other and unspecified hyperlipidemia 05/20/2013   Stress fracture 2008   left foot   Trigger finger of left hand    ring finger    Past Surgical History:  Procedure Laterality Date   ANKLE SURGERY Left 07/2016   BREAST BIOPSY Left 2018   CATARACT EXTRACTION Bilateral 2023   COLONOSCOPY     last 2011 DB normal    EYE SURGERY  2005   Lasik   FOOT SURGERY Left 04/15/2014   left foot-bone spur, Dr. Sharol Given; second surgery 01/2015 to correct prior surgery   FOOT SURGERY Left 01/2015   Dr Beckey Downing at [facility] Left 10+ yrs   left foot   LEEP  15+ yrs   normal paps since then   WISDOM TOOTH EXTRACTION      No current facility-administered medications for this encounter.   Current Outpatient Medications  Medication Sig Dispense Refill Last Dose   aspirin 81 MG tablet Take 81 mg by mouth daily.        Calcium Carb-Cholecalciferol (CALCIUM 600/VITAMIN D3 PO) Take 1 tablet by mouth daily.      cetirizine (ZYRTEC) 10 MG tablet Take 10 mg by mouth daily.        famciclovir (FAMVIR) 250 MG tablet Take 1 tablet (250 mg total) by mouth 2 (two) times daily. (Patient taking differently: Take 250 mg by mouth daily.) 180 tablet 4  fluticasone (FLONASE) 50 MCG/ACT nasal spray SPRAY ONCE INTO EACH NOSTRIL DAILY. IN [place], SUM[place], AND FALL (Patient taking differently: Place 2 sprays into both nostrils daily as needed for allergies.) 48 mL 3    Krill Oil 500 MG CAPS Take 500 mg by mouth daily.      Multiple Vitamins-Minerals (MULTIVITAMIN WITH MINERALS) tablet Take 1 tablet by mouth daily.      Multiple Vitamins-Minerals (PRESERVISION AREDS 2+MULTI VIT) CAPS Take 1 capsule by mouth daily.      pantoprazole (PROTONIX) 40 MG tablet TAKE 1 TABLET BY MOUTH EVERY DAY 90 tablet 3    Probiotic Product (PROBIOTIC PO) Take 1  capsule by mouth daily.      venlafaxine XR (EFFEXOR-XR) 75 MG 24 hr capsule TAKE ONE CAPSULE BY MOUTH EVERY DAY WITH BREAKFAST 90 capsule 2    Allergies  Allergen Reactions   Codeine Nausea Only    Social History   Tobacco Use   Smoking status: Former    Packs/day: 0.25    Types: Cigarettes    Quit date: 09/13/1983    Years since quitting: 38.4   Smokeless tobacco: Never  Substance Use Topics   Alcohol use: Yes    Alcohol/week: 14.0 standard drinks of alcohol    Types: 14 Glasses of wine per week    Family History  Problem Relation Age of Onset   Other Mother        CHF   Diabetes Mother        Borderline   Hypertension Mother    Obesity Mother    Anxiety disorder Mother    Heart disease Mother    Other Father        CHF   Obesity Sister    Diabetes Sister        type 2   Hypertension Sister    Anxiety disorder Sister    Diverticulosis Sister    Hyperlipidemia Sister    Arthritis Sister    Obesity Sister    Anxiety disorder Sister    Fibrocystic breast disease Sister    Thyroid disease Sister    Colon polyps Sister    Obesity Sister    Breast cancer Maternal Aunt    Cancer Maternal Aunt        breast   Breast cancer Paternal Aunt    Cancer Paternal Aunt        Breast   Obesity Paternal Grandmother    Other Paternal Grandfather        Committed suicide during the [place] Depression   Breast cancer Cousin    Cancer Cousin        recurrent metastatic, 10 year span   Obesity Brother    Anxiety disorder Brother    Arthritis Brother    Colon cancer Neg Hx    Esophageal cancer Neg Hx    Rectal cancer Neg Hx    Stomach cancer Neg Hx      Review of Systems  All other systems reviewed and are negative.   Objective:  Physical Exam Vitals reviewed.  Constitutional:      Appearance: Normal appearance. She is obese.  HENT:     Head: Normocephalic and atraumatic.  Eyes:     Extraocular Movements: Extraocular movements intact.     Pupils: Pupils are  equal, round, and reactive to light.  Cardiovascular:     Rate and Rhythm: Normal rate and regular rhythm.     Pulses: Normal pulses.  Pulmonary:  Effort: Pulmonary effort is normal.     Breath sounds: Normal breath sounds.  Musculoskeletal:     Cervical back: Normal range of motion and neck supple.     Right hip: Tenderness and bony tenderness present. Decreased range of motion. Decreased strength.  Neurological:     Mental Status: She is alert and oriented to person, place, and time.  Psychiatric:        Behavior: Behavior normal.     Vital signs in last 24 hours:    Labs:   Estimated body mass index is 36.11 kg/m as calculated from the following:   Height as of 02/21/22: 5' 0.5" (1.537 m).   Weight as of 02/21/22: 85.3 kg.   Imaging Review Plain radiographs demonstrate severe degenerative joint disease of the right hip(s). The bone quality appears to be excellent for age and reported activity level.      Assessment/Plan:  End stage arthritis, right hip(s)  The patient history, physical examination, clinical judgement of the provider and imaging studies are consistent with end stage degenerative joint disease of the right hip(s) and total hip arthroplasty is deemed medically necessary. The treatment options including medical management, injection therapy, arthroscopy and arthroplasty were discussed at length. The risks and benefits of total hip arthroplasty were presented and reviewed. The risks due to aseptic loosening, infection, stiffness, dislocation/subluxation,  thromboembolic complications and other imponderables were discussed.  The patient acknowledged the explanation, agreed to proceed with the plan and consent was signed. Patient is being admitted for inpatient treatment for surgery, pain control, PT, OT, prophylactic antibiotics, VTE prophylaxis, progressive ambulation and ADL's and discharge planning.The patient is planning to be discharged home with home  health services

## 2022-02-25 ENCOUNTER — Ambulatory Visit (HOSPITAL_COMMUNITY): Payer: Medicare Other

## 2022-02-25 ENCOUNTER — Observation Stay (HOSPITAL_COMMUNITY)
Admission: RE | Admit: 2022-02-25 | Discharge: 2022-02-27 | Disposition: A | Discharge status: Home / Self Care | Payer: Medicare Other | Attending: Orthopaedic Surgery | Admitting: Orthopaedic Surgery

## 2022-02-25 ENCOUNTER — Encounter (HOSPITAL_COMMUNITY): Payer: Self-pay | Admitting: Orthopaedic Surgery

## 2022-02-25 ENCOUNTER — Ambulatory Visit (HOSPITAL_COMMUNITY): Payer: Medicare Other | Admitting: Anesthesiology

## 2022-02-25 ENCOUNTER — Other Ambulatory Visit: Payer: Self-pay

## 2022-02-25 ENCOUNTER — Encounter (HOSPITAL_COMMUNITY)
Admission: RE | Disposition: A | Discharge status: Home / Self Care | Payer: Self-pay | Source: Home / Self Care | Attending: Orthopaedic Surgery

## 2022-02-25 ENCOUNTER — Ambulatory Visit (HOSPITAL_BASED_OUTPATIENT_CLINIC_OR_DEPARTMENT_OTHER): Payer: Medicare Other | Admitting: Anesthesiology

## 2022-02-25 ENCOUNTER — Observation Stay (HOSPITAL_COMMUNITY): Payer: Medicare Other

## 2022-02-25 DIAGNOSIS — Z79899 Other long term (current) drug therapy: Secondary | ICD-10-CM | POA: Diagnosis not present

## 2022-02-25 DIAGNOSIS — R2689 Other abnormalities of gait and mobility: Secondary | ICD-10-CM | POA: Diagnosis not present

## 2022-02-25 DIAGNOSIS — Z471 Aftercare following joint replacement surgery: Secondary | ICD-10-CM | POA: Diagnosis not present

## 2022-02-25 DIAGNOSIS — R030 Elevated blood-pressure reading, without diagnosis of hypertension: Secondary | ICD-10-CM

## 2022-02-25 DIAGNOSIS — M1611 Unilateral primary osteoarthritis, right hip: Secondary | ICD-10-CM | POA: Diagnosis not present

## 2022-02-25 DIAGNOSIS — Z96641 Presence of right artificial hip joint: Secondary | ICD-10-CM | POA: Diagnosis not present

## 2022-02-25 DIAGNOSIS — Z7982 Long term (current) use of aspirin: Secondary | ICD-10-CM | POA: Diagnosis not present

## 2022-02-25 DIAGNOSIS — Z87891 Personal history of nicotine dependence: Secondary | ICD-10-CM | POA: Diagnosis not present

## 2022-02-25 DIAGNOSIS — Z01818 Encounter for other preprocedural examination: Secondary | ICD-10-CM

## 2022-02-25 HISTORY — PX: TOTAL HIP ARTHROPLASTY: SHX124

## 2022-02-25 LAB — ABO/RH: ABO/RH(D): O POS

## 2022-02-25 LAB — TYPE AND SCREEN
ABO/RH(D): O POS
Antibody Screen: NEGATIVE

## 2022-02-25 SURGERY — ARTHROPLASTY, HIP, TOTAL, ANTERIOR APPROACH
Anesthesia: Monitor Anesthesia Care | Site: Hip | Laterality: Right

## 2022-02-25 MED ORDER — KETOROLAC TROMETHAMINE 15 MG/ML IJ SOLN
7.5000 mg | Freq: Four times a day (QID) | INTRAMUSCULAR | Status: AC
  Administered 2022-02-25 – 2022-02-26 (×4): 7.5 mg via INTRAVENOUS
  Filled 2022-02-25 (×4): qty 1

## 2022-02-25 MED ORDER — OXYCODONE HCL 5 MG PO TABS
10.0000 mg | ORAL_TABLET | ORAL | Status: DC | PRN
  Administered 2022-02-25 – 2022-02-27 (×7): 15 mg via ORAL
  Filled 2022-02-25 (×5): qty 3
  Filled 2022-02-25: qty 2
  Filled 2022-02-25 (×2): qty 3

## 2022-02-25 MED ORDER — FENTANYL CITRATE (PF) 100 MCG/2ML IJ SOLN
INTRAMUSCULAR | Status: AC
  Filled 2022-02-25: qty 2

## 2022-02-25 MED ORDER — PHENYLEPHRINE 80 MCG/ML (10ML) SYRINGE FOR IV PUSH (FOR BLOOD PRESSURE SUPPORT)
PREFILLED_SYRINGE | INTRAVENOUS | Status: DC | PRN
  Administered 2022-02-25 (×3): 80 ug via INTRAVENOUS

## 2022-02-25 MED ORDER — SODIUM CHLORIDE 0.9 % IV SOLN: INTRAVENOUS | Status: DC

## 2022-02-25 MED ORDER — CHLORHEXIDINE GLUCONATE 0.12 % MT SOLN
15.0000 mL | Freq: Once | OROMUCOSAL | Status: AC
Start: 2022-02-25 — End: 2022-02-25
  Administered 2022-02-25: 15 mL via OROMUCOSAL

## 2022-02-25 MED ORDER — PROPOFOL 10 MG/ML IV BOLUS
INTRAVENOUS | Status: AC
  Filled 2022-02-25: qty 20

## 2022-02-25 MED ORDER — PROMETHAZINE HCL 25 MG/ML IJ SOLN: 6.2500 mg | INTRAMUSCULAR | Status: DC | PRN

## 2022-02-25 MED ORDER — ACETAMINOPHEN 325 MG PO TABS
325.0000 mg | ORAL_TABLET | Freq: Four times a day (QID) | ORAL | Status: DC | PRN
  Administered 2022-02-25: 650 mg via ORAL
  Filled 2022-02-25: qty 2

## 2022-02-25 MED ORDER — STERILE WATER FOR IRRIGATION IR SOLN
Status: DC | PRN
  Administered 2022-02-25: 2000 mL

## 2022-02-25 MED ORDER — METOCLOPRAMIDE HCL 5 MG PO TABS
5.0000 mg | ORAL_TABLET | Freq: Three times a day (TID) | ORAL | Status: DC | PRN
  Administered 2022-02-26: 5 mg via ORAL
  Administered 2022-02-26: 10 mg via ORAL
  Filled 2022-02-25: qty 1
  Filled 2022-02-25: qty 2

## 2022-02-25 MED ORDER — METHOCARBAMOL 500 MG PO TABS
500.0000 mg | ORAL_TABLET | Freq: Four times a day (QID) | ORAL | Status: DC | PRN
  Administered 2022-02-25 – 2022-02-26 (×2): 500 mg via ORAL
  Filled 2022-02-25 (×2): qty 1

## 2022-02-25 MED ORDER — METOCLOPRAMIDE HCL 5 MG/ML IJ SOLN
5.0000 mg | Freq: Three times a day (TID) | INTRAMUSCULAR | Status: DC | PRN
  Administered 2022-02-25: 10 mg via INTRAVENOUS
  Filled 2022-02-25: qty 2

## 2022-02-25 MED ORDER — BUPIVACAINE IN DEXTROSE 0.75-8.25 % IT SOLN
INTRATHECAL | Status: DC | PRN
  Administered 2022-02-25: 1.8 mL via INTRATHECAL

## 2022-02-25 MED ORDER — METHOCARBAMOL 500 MG IVPB - SIMPLE MED: 500.0000 mg | Freq: Four times a day (QID) | INTRAVENOUS | Status: DC | PRN

## 2022-02-25 MED ORDER — PROPOFOL 10 MG/ML IV BOLUS
INTRAVENOUS | Status: DC | PRN
  Administered 2022-02-25: 20 mg via INTRAVENOUS

## 2022-02-25 MED ORDER — ONDANSETRON HCL 4 MG/2ML IJ SOLN
4.0000 mg | Freq: Four times a day (QID) | INTRAMUSCULAR | Status: DC | PRN
  Administered 2022-02-25: 4 mg via INTRAVENOUS
  Filled 2022-02-25: qty 2

## 2022-02-25 MED ORDER — LACTATED RINGERS IV SOLN: INTRAVENOUS | Status: DC

## 2022-02-25 MED ORDER — PROPOFOL 500 MG/50ML IV EMUL
INTRAVENOUS | Status: DC | PRN
  Administered 2022-02-25: 80 ug/kg/min via INTRAVENOUS

## 2022-02-25 MED ORDER — PHENYLEPHRINE HCL (PRESSORS) 10 MG/ML IV SOLN
INTRAVENOUS | Status: AC
  Filled 2022-02-25: qty 1

## 2022-02-25 MED ORDER — AMISULPRIDE (ANTIEMETIC) 5 MG/2ML IV SOLN: 10.0000 mg | Freq: Once | INTRAVENOUS | Status: DC | PRN

## 2022-02-25 MED ORDER — CEFAZOLIN SODIUM-DEXTROSE 1-4 GM/50ML-% IV SOLN
1.0000 g | Freq: Four times a day (QID) | INTRAVENOUS | Status: AC
  Administered 2022-02-25 (×2): 1 g via INTRAVENOUS
  Filled 2022-02-25 (×2): qty 50

## 2022-02-25 MED ORDER — DOCUSATE SODIUM 100 MG PO CAPS
100.0000 mg | ORAL_CAPSULE | Freq: Two times a day (BID) | ORAL | Status: DC
  Administered 2022-02-25 – 2022-02-27 (×4): 100 mg via ORAL
  Filled 2022-02-25 (×4): qty 1

## 2022-02-25 MED ORDER — PANTOPRAZOLE SODIUM 40 MG PO TBEC
40.0000 mg | DELAYED_RELEASE_TABLET | Freq: Every day | ORAL | Status: DC
  Administered 2022-02-25 – 2022-02-27 (×3): 40 mg via ORAL
  Filled 2022-02-25 (×3): qty 1

## 2022-02-25 MED ORDER — VENLAFAXINE HCL ER 75 MG PO CP24
75.0000 mg | ORAL_CAPSULE | Freq: Every day | ORAL | Status: DC
  Administered 2022-02-26 – 2022-02-27 (×2): 75 mg via ORAL
  Filled 2022-02-25 (×2): qty 1

## 2022-02-25 MED ORDER — FENTANYL CITRATE PF 50 MCG/ML IJ SOSY: 25.0000 ug | PREFILLED_SYRINGE | INTRAMUSCULAR | Status: DC | PRN

## 2022-02-25 MED ORDER — SODIUM CHLORIDE 0.9 % IR SOLN
Status: DC | PRN
  Administered 2022-02-25: 1000 mL

## 2022-02-25 MED ORDER — ALUM & MAG HYDROXIDE-SIMETH 200-200-20 MG/5ML PO SUSP
30.0000 mL | ORAL | Status: DC | PRN
Start: 2022-02-25 — End: 2022-02-27

## 2022-02-25 MED ORDER — PHENYLEPHRINE HCL-NACL 20-0.9 MG/250ML-% IV SOLN
INTRAVENOUS | Status: DC | PRN
  Administered 2022-02-25: 30 ug/min via INTRAVENOUS

## 2022-02-25 MED ORDER — POLYETHYLENE GLYCOL 3350 17 G PO PACK: 17.0000 g | PACK | Freq: Every day | ORAL | Status: DC | PRN

## 2022-02-25 MED ORDER — FENTANYL CITRATE PF 50 MCG/ML IJ SOSY
PREFILLED_SYRINGE | INTRAMUSCULAR | Status: AC
  Administered 2022-02-25: 50 ug via INTRAVENOUS
  Filled 2022-02-25: qty 1

## 2022-02-25 MED ORDER — OXYCODONE HCL 5 MG PO TABS
ORAL_TABLET | ORAL | Status: AC
  Administered 2022-02-25: 5 mg via ORAL
  Filled 2022-02-25: qty 1

## 2022-02-25 MED ORDER — MIDAZOLAM HCL 2 MG/2ML IJ SOLN
INTRAMUSCULAR | Status: AC
  Filled 2022-02-25: qty 2

## 2022-02-25 MED ORDER — HYDROMORPHONE HCL 1 MG/ML IJ SOLN
0.5000 mg | INTRAMUSCULAR | Status: DC | PRN
  Administered 2022-02-25: 1 mg via INTRAVENOUS
  Filled 2022-02-25: qty 1

## 2022-02-25 MED ORDER — CELECOXIB 200 MG PO CAPS
200.0000 mg | ORAL_CAPSULE | Freq: Once | ORAL | Status: AC
  Administered 2022-02-25: 200 mg via ORAL
  Filled 2022-02-25: qty 1

## 2022-02-25 MED ORDER — ONDANSETRON HCL 4 MG PO TABS
4.0000 mg | ORAL_TABLET | Freq: Four times a day (QID) | ORAL | Status: DC | PRN
  Administered 2022-02-25 – 2022-02-26 (×2): 4 mg via ORAL
  Filled 2022-02-25 (×2): qty 1

## 2022-02-25 MED ORDER — FENTANYL CITRATE (PF) 100 MCG/2ML IJ SOLN
INTRAMUSCULAR | Status: DC | PRN
  Administered 2022-02-25: 50 ug via INTRAVENOUS

## 2022-02-25 MED ORDER — POVIDONE-IODINE 10 % EX SWAB
2.0000 | Freq: Once | CUTANEOUS | Status: AC
  Administered 2022-02-25: 2 via TOPICAL

## 2022-02-25 MED ORDER — VALACYCLOVIR HCL 500 MG PO TABS
1000.0000 mg | ORAL_TABLET | Freq: Every day | ORAL | Status: DC
  Administered 2022-02-26 – 2022-02-27 (×2): 1000 mg via ORAL
  Filled 2022-02-25 (×2): qty 2

## 2022-02-25 MED ORDER — CEFAZOLIN SODIUM-DEXTROSE 2-4 GM/100ML-% IV SOLN
2.0000 g | INTRAVENOUS | Status: AC
  Administered 2022-02-25: 2 g via INTRAVENOUS
  Filled 2022-02-25: qty 100

## 2022-02-25 MED ORDER — ORAL CARE MOUTH RINSE: 15.0000 mL | Freq: Once | OROMUCOSAL | Status: AC

## 2022-02-25 MED ORDER — PHENOL 1.4 % MT LIQD: 1.0000 | OROMUCOSAL | Status: DC | PRN

## 2022-02-25 MED ORDER — TRANEXAMIC ACID-NACL 1000-0.7 MG/100ML-% IV SOLN
1000.0000 mg | INTRAVENOUS | Status: AC
  Administered 2022-02-25: 1000 mg via INTRAVENOUS
  Filled 2022-02-25: qty 100

## 2022-02-25 MED ORDER — MIDAZOLAM HCL 5 MG/5ML IJ SOLN
INTRAMUSCULAR | Status: DC | PRN
  Administered 2022-02-25: 2 mg via INTRAVENOUS

## 2022-02-25 MED ORDER — DIPHENHYDRAMINE HCL 12.5 MG/5ML PO ELIX: 12.5000 mg | ORAL_SOLUTION | ORAL | Status: DC | PRN

## 2022-02-25 MED ORDER — METHOCARBAMOL 500 MG IVPB - SIMPLE MED
INTRAVENOUS | Status: AC
  Administered 2022-02-25: 500 mg via INTRAVENOUS
  Filled 2022-02-25: qty 50

## 2022-02-25 MED ORDER — MENTHOL 3 MG MT LOZG: 1.0000 | LOZENGE | OROMUCOSAL | Status: DC | PRN

## 2022-02-25 MED ORDER — ONDANSETRON HCL 4 MG/2ML IJ SOLN
INTRAMUSCULAR | Status: AC
  Filled 2022-02-25: qty 4

## 2022-02-25 MED ORDER — 0.9 % SODIUM CHLORIDE (POUR BTL) OPTIME
TOPICAL | Status: DC | PRN
  Administered 2022-02-25: 1000 mL

## 2022-02-25 MED ORDER — ONDANSETRON HCL 4 MG/2ML IJ SOLN
INTRAMUSCULAR | Status: DC | PRN
  Administered 2022-02-25: 4 mg via INTRAVENOUS

## 2022-02-25 MED ORDER — ASPIRIN 81 MG PO CHEW
81.0000 mg | CHEWABLE_TABLET | Freq: Two times a day (BID) | ORAL | Status: DC
  Administered 2022-02-25 – 2022-02-27 (×4): 81 mg via ORAL
  Filled 2022-02-25 (×4): qty 1

## 2022-02-25 MED ORDER — LIDOCAINE 2% (20 MG/ML) 5 ML SYRINGE
INTRAMUSCULAR | Status: DC | PRN
  Administered 2022-02-25: 80 mg via INTRAVENOUS

## 2022-02-25 MED ORDER — PROPOFOL 1000 MG/100ML IV EMUL
INTRAVENOUS | Status: AC
Start: 2022-02-25 — End: ?
  Filled 2022-02-25: qty 200

## 2022-02-25 MED ORDER — OXYCODONE HCL 5 MG PO TABS
5.0000 mg | ORAL_TABLET | ORAL | Status: DC | PRN
  Administered 2022-02-25: 5 mg via ORAL
  Administered 2022-02-25: 10 mg via ORAL
  Filled 2022-02-25: qty 2

## 2022-02-25 MED ORDER — DEXAMETHASONE SODIUM PHOSPHATE 10 MG/ML IJ SOLN
INTRAMUSCULAR | Status: AC
  Filled 2022-02-25: qty 2

## 2022-02-25 MED ORDER — ACETAMINOPHEN 500 MG PO TABS
1000.0000 mg | ORAL_TABLET | Freq: Once | ORAL | Status: AC
  Administered 2022-02-25: 1000 mg via ORAL
  Filled 2022-02-25: qty 2

## 2022-02-25 MED ORDER — LIDOCAINE HCL (PF) 2 % IJ SOLN
INTRAMUSCULAR | Status: AC
  Filled 2022-02-25: qty 10

## 2022-02-25 MED ORDER — DEXAMETHASONE SODIUM PHOSPHATE 10 MG/ML IJ SOLN
INTRAMUSCULAR | Status: DC | PRN
  Administered 2022-02-25: 4 mg via INTRAVENOUS

## 2022-02-25 SURGICAL SUPPLY — 34 items
BAG COUNTER SPONGE SURGICOUNT (BAG) ×2 IMPLANT
BAG ZIPLOCK 12X15 (MISCELLANEOUS) ×2 IMPLANT
BLADE SAW SGTL 18X1.27X75 (BLADE) ×2 IMPLANT
COVER PERINEAL POST (MISCELLANEOUS) ×2 IMPLANT
COVER SURGICAL LIGHT HANDLE (MISCELLANEOUS) ×2 IMPLANT
DRAPE FOOT SWITCH (DRAPES) ×2 IMPLANT
DRAPE STERI IOBAN 125X83 (DRAPES) ×2 IMPLANT
DRAPE U-SHAPE 47X51 STRL (DRAPES) ×4 IMPLANT
DRSG AQUACEL AG ADV 3.5X10 (GAUZE/BANDAGES/DRESSINGS) ×2 IMPLANT
DURAPREP 26ML APPLICATOR (WOUND CARE) ×2 IMPLANT
ELECT REM PT RETURN 15FT ADLT (MISCELLANEOUS) ×2 IMPLANT
GLOVE BIO SURGEON STRL SZ7.5 (GLOVE) ×3 IMPLANT
GLOVE BIOGEL PI IND STRL 8 (GLOVE) ×2 IMPLANT
GLOVE BIOGEL PI INDICATOR 8 (GLOVE) ×2
GLOVE ECLIPSE 8.0 STRL XLNG CF (GLOVE) ×3 IMPLANT
GOWN STRL REUS W/ TWL XL LVL3 (GOWN DISPOSABLE) ×2 IMPLANT
GOWN STRL REUS W/TWL XL LVL3 (GOWN DISPOSABLE) ×4
HANDPIECE INTERPULSE COAX TIP (DISPOSABLE) ×2
HEAD CERAMIC 36 PLUS5 (Hips) ×1 IMPLANT
HOLDER FOLEY CATH W/STRAP (MISCELLANEOUS) ×2 IMPLANT
KIT TURNOVER KIT A (KITS) ×1 IMPLANT
LINER ACETAB NEUTRAL 36ID 520D (Liner) ×1 IMPLANT
PACK ANTERIOR HIP CUSTOM (KITS) ×2 IMPLANT
PIN SECTOR W/GRIP ACE CUP 52MM (Hips) ×1 IMPLANT
SET HNDPC FAN SPRY TIP SCT (DISPOSABLE) ×1 IMPLANT
STAPLER VISISTAT 35W (STAPLE) ×1 IMPLANT
STEM FEM ACTIS HIGH SZ1 (Stem) ×1 IMPLANT
SUT ETHIBOND NAB CT1 #1 30IN (SUTURE) ×2 IMPLANT
SUT VIC AB 0 CT1 36 (SUTURE) ×2 IMPLANT
SUT VIC AB 1 CT1 36 (SUTURE) ×2 IMPLANT
SUT VIC AB 2-0 CT1 27 (SUTURE) ×4
SUT VIC AB 2-0 CT1 TAPERPNT 27 (SUTURE) ×2 IMPLANT
TRAY FOLEY MTR SLVR 14FR STAT (SET/KITS/TRAYS/PACK) ×1 IMPLANT
YANKAUER SUCT BULB TIP NO VENT (SUCTIONS) ×2 IMPLANT

## 2022-02-25 NOTE — TOC Transition Note (Signed)
Transition of Care [facility]) - CM/SW Discharge Note   Patient Details  Name: Breanna Bridges MRN: [id number] Date of Birth: 05/22/1956  [facility] CM/SW Contact:  Lennart Pall, LCSW Phone Number: 02/25/2022, 2:03 PM   Clinical Narrative:     Met with pt and spouse and confirming need for rolling walker - no agency preference - order placed with [facility] for delivery to room.  Pt aware she is set up with [facility] for HHPT.  No further TOC needs.  Final next level of care: [place] Barriers to Discharge: No Barriers Identified   Patient Goals and CMS Choice Patient states their goals for this hospitalization and ongoing recovery are:: return home      Discharge Placement                       Discharge Plan and Services                DME Arranged: Walker rolling DME Agency: [employer] Date DME Agency Contacted: 02/25/22 Time DME Agency Contacted: [phone] Representative spoke with at DME Agency: [place]: PT [place]: [place]        Social Determinants of Health (SDOH) Interventions     Readmission Risk Interventions     No data to display

## 2022-02-25 NOTE — Interval H&P Note (Signed)
History and Physical Interval Note: The patient understands that she is here today for a right hip replacement to treat her right hip osteoarthritis.  There has been no acute or interval change in her medical status.  Please see H&P.  The risks and benefits of surgery been explained in detail and informed consent was obtained.  The right operative hip has been marked.  02/25/2022 7:10 AM  Breanna Bridges  has presented today for surgery, with the diagnosis of osteoarthritis / degenerative joint disease right hip.  The various methods of treatment have been discussed with the patient and family. After consideration of risks, benefits and other options for treatment, the patient has consented to  Procedure(s): RIGHT TOTAL HIP ARTHROPLASTY ANTERIOR APPROACH (Right) as a surgical intervention.  The patient's history has been reviewed, patient examined, no change in status, stable for surgery.  I have reviewed the patient's chart and labs.  Questions were answered to the patient's satisfaction.     Mcarthur Rossetti

## 2022-02-25 NOTE — Op Note (Signed)
NAME: Breanna Bridges, Breanna Bridges MEDICAL RECORD NO: [id number] ACCOUNT NO: [id number] DATE OF BIRTH: 1955-10-20 FACILITY: [facility] LOCATION: [facility] PHYSICIAN: Lind Guest. Ninfa Linden, MD  Operative Report   DATE OF PROCEDURE: 02/25/2022  PREOPERATIVE DIAGNOSIS:  Primary osteoarthritis and degenerative joint disease, right hip.  POSTOPERATIVE DIAGNOSIS:  Primary osteoarthritis and degenerative joint disease, right hip.  PROCEDURE:  Right total hip arthroplasty through direct anterior approach.  IMPLANTS:  DePuy Sector Gription acetabular component size 52, size 36+0 neutral polyethylene liner, size 1 ACTIS femoral component with high offset, size 36+5 ceramic hip ball.  SURGEON:  Lind Guest. Ninfa Linden, MD  ASSISTANT:  Benita Stabile, PA-C.  ANESTHESIA:  Spinal.  ANTIBIOTICS:  2 g IV Ancef.  ESTIMATED BLOOD LOSS:  150 mL.  COMPLICATIONS:  None.  INDICATIONS:  The patient is a very pleasant 66 year old female with debilitating arthritis involving her hips.  Her right hip is worse than her left and her pain has become daily where it is detrimentally affecting her mobility, her quality of life and  activities of daily living.  Her x-rays do show severe arthritis of the right hip.  She has tried and failed all forms of conservative treatment and does wish to proceed with a hip replacement.  We talked in length and detail about the risk of acute  blood loss anemia, nerve or vessel injury, fracture, infection, implant failure, dislocation, leg length differences and skin and soft tissue issues.  We talked about our goals being decreased pain, improve mobility and overall improve quality of life.  DESCRIPTION OF PROCEDURE:  After informed consent was obtained, appropriate right hip was marked.  She was brought to the operating room and sat up on the stretcher where spinal anesthesia was obtained.  She was laid in supine position on the stretcher.   Foley catheter was placed and traction boots were  placed on both her feet.  Next, she was placed supine on the Hana fracture table, the perineal post in place and both legs in line skeletal traction device and no traction applied.  Her right operative  hip was prepped and draped with DuraPrep and sterile drapes.  A timeout was called.  She was identified as correct patient, correct right hip.  I then made an incision just inferior and posterior to the anterior superior iliac spine and carried this  obliquely down the leg.  We dissected down tensor fascia lata muscle.  Tensor fascia was then divided longitudinally to proceed with direct anterior approach to the hip.  We identified and cauterized circumflex vessels, then identified the hip capsule,  opened the hip capsule in L-type format finding a moderate joint effusion.  I then placed curved retractors in medial and lateral femoral neck and made a femoral neck cut with oscillating saw just proximal to the lesser trochanter and completed this with  an osteotome.  We placed a corkscrew guide in the femoral head and removed the femoral head in its entirety and found a wide area devoid of cartilage.  We then placed a bent Hohmann over the medial acetabular rim and removed remnants of acetabular  labrum and other debris.  We then began reaming under direct visualization from a size 43 reamer in a stepwise increments going up to a size 52 reamer with all reamers placed under direct visualization.  Last reamer was also placed under direct  fluoroscopy, so I could obtain my depth of reaming, my inclination and anteversion.  I then placed real DePuy Sector Gription acetabular component size 52  and a 36+0 neutral polyethylene liner.  Attention was then turned to the femur.  With the leg  externally rotated to 120 degrees, extended and adducted, we were able to place a Mueller retractor medially and Hohmann retractor behind the greater trochanter.  We released lateral joint capsule and used a box cutting osteotome to  enter femoral canal  and a rongeur to lateralize.  We then began broaching using the ACTIS broaching system from a size 0 going on to a size 1 due to the thick bone and narrow femoral canal. With a size 1 in place, we trialed a standard offset femoral neck and a 36+1.5 hip  ball, reduced this in acetabulum and based on radiographic assessment and clinical assessment, we needed more offset and leg length.  We dislocated the hip and removed the trial components.  We placed the real ACTIS femoral component size 1, but a high  offset component and the real 36+5 ceramic hip ball and again reduced this in acetabulum.  We definitely were pleased with stability based on radiographic assessment and clinical assessment.  We did lengthen her some and gave her more offset, but she  needed this for stability.  We then irrigated the soft tissue with normal saline solution using pulsatile lavage.  We closed the joint capsule with interrupted #1 Ethibond suture followed by #1 Vicryl to close the tensor fascia.  0 Vicryl was used to  close the deep tissue and 2-0 Vicryl was used to close subcutaneous tissue.  The skin was closed with staples.  A well-padded Aquacel dressing was applied.  She was taken off the Hana table and taken to recovery room in stable condition with all final  counts being correct.  No complications noted.  Of note, Benita Stabile, PA-C did assist in entire case from beginning to end.  His assistance was crucial for helping manage soft tissues as well as guiding implant placement and layered closure of the wound.   His assistance was medically necessary.   NIK D: 02/25/2022 9:43:05 am T: 02/25/2022 11:00:00 am  JOB: [id number]/ [id number]

## 2022-02-25 NOTE — Anesthesia Procedure Notes (Signed)
Spinal  Patient location during procedure: OR Start time: 02/25/2022 8:23 AM End time: 02/25/2022 8:32 AM Reason for block: surgical anesthesia Staffing Performed: anesthesiologist  Anesthesiologist: Duane Boston, MD Performed by: Duane Boston, MD Authorized by: Duane Boston, MD   Preanesthetic Checklist Completed: patient identified, IV checked, risks and benefits discussed, surgical consent, monitors and equipment checked, pre-op evaluation and timeout performed Spinal Block Patient position: sitting Prep: DuraPrep Patient monitoring: cardiac monitor, continuous pulse ox and blood pressure Approach: midline Location: L2-3 Injection technique: single-shot Needle Needle type: Pencan  Needle gauge: 24 G Needle length: 9 cm Assessment Events: CSF return Additional Notes Functioning IV was confirmed and monitors were applied. Sterile prep and drape, including hand hygiene and sterile gloves were used. The patient was positioned and the spine was prepped. The skin was anesthetized with lidocaine.  Free flow of clear CSF was obtained prior to injecting local anesthetic into the CSF.  The spinal needle aspirated freely following injection.  The needle was carefully withdrawn.  The patient tolerated the procedure well.

## 2022-02-25 NOTE — Anesthesia Postprocedure Evaluation (Signed)
Anesthesia Post Note  Patient: Breanna Bridges  Procedure(s) Performed: RIGHT TOTAL HIP ARTHROPLASTY ANTERIOR APPROACH (Right: Hip)     Patient location during evaluation: PACU Anesthesia Type: MAC Level of consciousness: awake and alert Pain management: pain level controlled Vital Signs Assessment: post-procedure vital signs reviewed and stable Respiratory status: spontaneous breathing and respiratory function stable Cardiovascular status: blood pressure returned to baseline and stable Postop Assessment: spinal receding Anesthetic complications: no   No notable events documented.  Last Vitals:  Vitals:   02/25/22 1145 02/25/22 1200  BP: (!) 138/59 134/75  Pulse: 63 72  Resp: 15 12  Temp:    SpO2: 97% 97%    Last Pain:  Vitals:   02/25/22 1200  TempSrc:   PainSc: (P) 1                  Ameliya Nicotra DANIEL

## 2022-02-25 NOTE — Transfer of Care (Signed)
Immediate Anesthesia Transfer of Care Note  Patient: Breanna Bridges  Procedure(s) Performed: Procedure(s): RIGHT TOTAL HIP ARTHROPLASTY ANTERIOR APPROACH (Right)  Patient Location: PACU  Anesthesia Type:General  Level of Consciousness:  sedated, patient cooperative and responds to stimulation  Airway & Oxygen Therapy:Patient Spontanous Breathing and Patient connected to face mask oxgen  Post-op Assessment:  Report given to PACU RN and Post -op Vital signs reviewed and stable  Post vital signs:  Reviewed and stable  Last Vitals:  Vitals:   02/25/22 0631  BP: (!) 145/77  Pulse: 90  Resp: 18  Temp: 37.1 C  SpO2: 16%    Complications: No apparent anesthesia complications

## 2022-02-25 NOTE — Brief Op Note (Signed)
02/25/2022  9:44 AM  PATIENT:  Breanna Bridges  66 y.o. female  PRE-OPERATIVE DIAGNOSIS:  osteoarthritis / degenerative joint disease right hip  POST-OPERATIVE DIAGNOSIS:  osteoarthritis / degenerative joint disease right hip  PROCEDURE:  Procedure(s): RIGHT TOTAL HIP ARTHROPLASTY ANTERIOR APPROACH (Right)  SURGEON:  Surgeon(s) and Role:    Mcarthur Rossetti, MD - Primary  PHYSICIAN ASSISTANT:  Benita Stabile, PA-C  ANESTHESIA:   spinal  EBL:  150 mL   DICTATION: .Other Dictation: Dictation Number [id number]  PLAN OF CARE: Admit for overnight observation  PATIENT DISPOSITION:  PACU - hemodynamically stable.   Delay start of Pharmacological VTE agent (>24hrs) due to surgical blood loss or risk of bleeding: no

## 2022-02-25 NOTE — Anesthesia Preprocedure Evaluation (Addendum)
Anesthesia Evaluation  Patient identified by MRN, date of birth, ID band  Reviewed: Allergy & Precautions, NPO status , Patient's Chart, lab work & pertinent test results  Airway Mallampati: III  TM Distance: >3 FB Neck ROM: Full    Dental no notable dental hx. (+) Dental Advisory Given   Pulmonary neg pulmonary ROS, former smoker,    Pulmonary exam normal        Cardiovascular negative cardio ROS Normal cardiovascular exam     Neuro/Psych PSYCHIATRIC DISORDERS Anxiety Depression negative neurological ROS     GI/Hepatic Neg liver ROS, GERD  ,  Endo/Other  negative endocrine ROS  Renal/GU negative Renal ROS     Musculoskeletal negative musculoskeletal ROS (+)   Abdominal   Peds  Hematology negative hematology ROS (+)   Anesthesia Other Findings   Reproductive/Obstetrics                            Anesthesia Physical Anesthesia Plan  ASA: 2  Anesthesia Plan: Spinal and MAC   Post-op Pain Management: Celebrex PO (pre-op)* and Tylenol PO (pre-op)*   Induction: Intravenous  PONV Risk Score and Plan: 2 and Ondansetron, Midazolam and Propofol infusion  Airway Management Planned: Natural Airway  Additional Equipment:   Intra-op Plan:   Post-operative Plan:   Informed Consent: I have reviewed the patients History and Physical, chart, labs and discussed the procedure including the risks, benefits and alternatives for the proposed anesthesia with the patient or authorized representative who has indicated his/her understanding and acceptance.     Dental advisory given  Plan Discussed with: Anesthesiologist and CRNA  Anesthesia Plan Comments:        Anesthesia Quick Evaluation

## 2022-02-26 DIAGNOSIS — Z87891 Personal history of nicotine dependence: Secondary | ICD-10-CM | POA: Diagnosis not present

## 2022-02-26 DIAGNOSIS — M1611 Unilateral primary osteoarthritis, right hip: Secondary | ICD-10-CM | POA: Diagnosis not present

## 2022-02-26 DIAGNOSIS — Z79899 Other long term (current) drug therapy: Secondary | ICD-10-CM | POA: Diagnosis not present

## 2022-02-26 DIAGNOSIS — Z7982 Long term (current) use of aspirin: Secondary | ICD-10-CM | POA: Diagnosis not present

## 2022-02-26 LAB — CBC
HCT: 31.3 % — ABNORMAL LOW (ref 36.0–46.0)
Hemoglobin: 10.3 g/dL — ABNORMAL LOW (ref 12.0–15.0)
MCH: 31 pg (ref 26.0–34.0)
MCHC: 32.9 g/dL (ref 30.0–36.0)
MCV: 94.3 fL (ref 80.0–100.0)
Platelets: 178 10*3/uL (ref 150–400)
RBC: 3.32 MIL/uL — ABNORMAL LOW (ref 3.87–5.11)
RDW: 13.3 % (ref 11.5–15.5)
WBC: 6 10*3/uL (ref 4.0–10.5)
nRBC: 0 % (ref 0.0–0.2)

## 2022-02-26 LAB — BASIC METABOLIC PANEL
Anion gap: 4 — ABNORMAL LOW (ref 5–15)
BUN: 20 mg/dL (ref 8–23)
CO2: 28 mmol/L (ref 22–32)
Calcium: 8.4 mg/dL — ABNORMAL LOW (ref 8.9–10.3)
Chloride: 106 mmol/L (ref 98–111)
Creatinine, Ser: 0.87 mg/dL (ref 0.44–1.00)
GFR, Estimated: >60 mL/min (ref >60)
Glucose, Bld: 122 mg/dL — ABNORMAL HIGH (ref 70–99)
Potassium: 4.5 mmol/L (ref 3.5–5.1)
Sodium: 138 mmol/L (ref 135–145)

## 2022-02-26 MED ORDER — OXYCODONE HCL 5 MG PO TABS: 5.0000 mg | ORAL_TABLET | Freq: Four times a day (QID) | ORAL | 0 refills | Status: DC | PRN

## 2022-02-26 MED ORDER — METHOCARBAMOL 500 MG PO TABS: 500.0000 mg | ORAL_TABLET | Freq: Four times a day (QID) | ORAL | 1 refills | Status: DC | PRN

## 2022-02-26 MED ORDER — ONDANSETRON HCL 4 MG PO TABS: 4.0000 mg | ORAL_TABLET | Freq: Four times a day (QID) | ORAL | 0 refills | Status: DC | PRN

## 2022-02-26 MED ORDER — ASPIRIN 81 MG PO CHEW: 81.0000 mg | CHEWABLE_TABLET | Freq: Two times a day (BID) | ORAL | 0 refills | Status: AC

## 2022-02-26 NOTE — Discharge Summary (Signed)
Patient ID: AOKI WEDEMEYER MRN: [id number] DOB/AGE: August 31, 1956 66 y.o.  Admit date: 02/25/2022 Discharge date: 02/26/2022  Admission Diagnoses:  Principal Problem:   Unilateral primary osteoarthritis, right hip Active Problems:   Status post total replacement of right hip   Discharge Diagnoses:  Same  Past Medical History:  Diagnosis Date   Abnormal Pap smear    CIN I-LEEP   Allergy    Anemia    takes a multi vit   Arthritis    back    Cataract 2/23   Depression with anxiety 01/18/2015   GERD (gastroesophageal reflux disease)    HSV infection    Other and unspecified hyperlipidemia 05/20/2013   Stress fracture 2008   left foot   Trigger finger of left hand    ring finger    Surgeries: Procedure(s): RIGHT TOTAL HIP ARTHROPLASTY ANTERIOR APPROACH on 02/25/2022   Consultants:   Discharged Condition: Improved  Hospital Course: ALLANAH MCFARLAND is an 66 y.o. female who was admitted 02/25/2022 for operative treatment ofUnilateral primary osteoarthritis, right hip. Patient has severe unremitting pain that affects sleep, daily activities, and work/hobbies. After pre-op clearance the patient was taken to the operating room on 02/25/2022 and underwent  Procedure(s): RIGHT TOTAL HIP ARTHROPLASTY ANTERIOR APPROACH.    Patient was given perioperative antibiotics:  Anti-infectives (From admission, onward)    Start     Dose/Rate Route Frequency Ordered Stop   02/26/22 1000  valACYclovir (VALTREX) tablet 1,000 mg        1,000 mg Oral Daily 02/25/22 1253     02/25/22 1430  ceFAZolin (ANCEF) IVPB 1 g/50 mL premix        1 g 100 mL/hr over 30 Minutes Intravenous Every 6 hours 02/25/22 1253 02/25/22 2136   02/25/22 0630  ceFAZolin (ANCEF) IVPB 2g/100 mL premix        2 g 200 mL/hr over 30 Minutes Intravenous On call to O.R. 02/25/22 4765 02/25/22 0829        Patient was given sequential compression devices, early ambulation, and chemoprophylaxis to prevent DVT.  Patient  benefited maximally from hospital stay and there were no complications.    Recent vital signs: Patient Vitals for the past 24 hrs:  BP Temp Temp src Pulse Resp SpO2  02/26/22 0606 (!) 110/58 98.2 F (36.8 C) -- 78 20 95 %  02/26/22 0154 (!) 111/53 98.1 F (36.7 C) -- 79 20 97 %  02/25/22 2238 124/68 98.2 F (36.8 C) -- 77 18 97 %  02/25/22 2235 -- 98.2 F (36.8 C) Oral -- -- --  02/25/22 1835 118/67 98 F (36.7 C) -- 76 18 95 %  02/25/22 1500 (!) 144/78 97.8 F (36.6 C) Axillary 90 16 94 %  02/25/22 1249 136/77 98 F (36.7 C) Oral 70 14 97 %  02/25/22 1230 125/61 -- -- 70 13 96 %  02/25/22 1215 (!) 147/69 -- -- 77 (!) 21 98 %  02/25/22 1200 134/75 -- -- 72 12 97 %  02/25/22 1145 (!) 138/59 -- -- 63 15 97 %  02/25/22 1130 (!) 129/59 -- -- 60 12 97 %  02/25/22 1115 132/60 -- -- (!) 58 13 97 %  02/25/22 1100 133/67 -- -- (!) 58 12 99 %  02/25/22 1045 133/64 -- -- (!) 58 13 98 %  02/25/22 1030 123/71 -- -- (!) 58 14 99 %  02/25/22 1015 117/64 -- -- 68 13 97 %  02/25/22 1000 (!) 109/57 98.1 F (36.7 C) -- --  14 100 %     Recent laboratory studies:  Recent Labs    02/26/22 0318  WBC 6.0  HGB 10.3*  HCT 31.3*  PLT 178  NA 138  K 4.5  CL 106  CO2 28  BUN 20  CREATININE 0.87  GLUCOSE 122*  CALCIUM 8.4*     Discharge Medications:   Allergies as of 02/26/2022       Reactions   Codeine Nausea Only        Medication List     STOP taking these medications    aspirin 81 MG tablet Replaced by: aspirin 81 MG chewable tablet       TAKE these medications    aspirin 81 MG chewable tablet Chew 1 tablet (81 mg total) by mouth 2 (two) times daily. Replaces: aspirin 81 MG tablet   CALCIUM 600/VITAMIN D3 PO Take 1 tablet by mouth daily.   cetirizine 10 MG tablet Commonly known as: ZYRTEC Take 10 mg by mouth daily.   famciclovir 250 MG tablet Commonly known as: FAMVIR Take 1 tablet (250 mg total) by mouth 2 (two) times daily. What changed: when to take  this   fluticasone 50 MCG/ACT nasal spray Commonly known as: FLONASE SPRAY ONCE INTO EACH NOSTRIL DAILY. IN [place], [place], AND FALL What changed: See the new instructions.   Krill Oil 500 MG Caps Take 500 mg by mouth daily.   methocarbamol 500 MG tablet Commonly known as: ROBAXIN Take 1 tablet (500 mg total) by mouth every 6 (six) hours as needed for muscle spasms.   ondansetron 4 MG tablet Commonly known as: ZOFRAN Take 1 tablet (4 mg total) by mouth every 6 (six) hours as needed for nausea.   oxyCODONE 5 MG immediate release tablet Commonly known as: Oxy IR/ROXICODONE Take 1-2 tablets (5-10 mg total) by mouth every 6 (six) hours as needed for moderate pain (pain score 4-6).   pantoprazole 40 MG tablet Commonly known as: PROTONIX TAKE 1 TABLET BY MOUTH EVERY DAY   PreserVision AREDS 2+Multi Vit Caps Take 1 capsule by mouth daily.   multivitamin with minerals tablet Take 1 tablet by mouth daily.   PROBIOTIC PO Take 1 capsule by mouth daily.   venlafaxine XR 75 MG 24 hr capsule Commonly known as: EFFEXOR-XR TAKE ONE CAPSULE BY MOUTH EVERY DAY WITH BREAKFAST               Durable Medical Equipment  (From admission, onward)           Start     Ordered   02/25/22 1254  DME 3 n 1  Once        02/25/22 1253   02/25/22 1254  DME Walker rolling  Once       Question Answer Comment  Walker: With 5 Inch Wheels   Patient needs a walker to treat with the following condition Status post total replacement of right hip      02/25/22 1253            Diagnostic Studies: DG Pelvis Portable  Result Date: 02/25/2022 CLINICAL DATA:  Status post right hip replacement EXAM: PORTABLE PELVIS 1-2 VIEWS COMPARISON:  None Available. FINDINGS: Interval right total hip arthroplasty. Postsurgical changes in the soft tissues around the right hip. No hip fracture or dislocation. No aggressive osseous lesion. Normal alignment. Soft tissue are unremarkable. IMPRESSION: 1.  Interval right total hip arthroplasty. Electronically Signed   By: Kathreen Devoid M.D.   On: 02/25/2022 10:37  DG HIP UNILAT WITH PELVIS 1V RIGHT  Result Date: 02/25/2022 CLINICAL DATA:  Total right hip arthroplasty. EXAM: DG HIP (WITH OR WITHOUT PELVIS) 1V RIGHT COMPARISON:  None Available. FINDINGS: Multiple intraoperative fluoroscopic spot images are provided. Interval right total hip arthroplasty. FLUOROSCOPY TIME:  Radiation Exposure Index: 2.84 mGy IMPRESSION: 1. Interval right total hip arthroplasty. Electronically Signed   By: Kathreen Devoid M.D.   On: 02/25/2022 10:15   DG C-Arm 1-60 Min-No Report  Result Date: 02/25/2022 Fluoroscopy was utilized by the requesting physician.  No radiographic interpretation.    Disposition: Discharge disposition: 06-Home-Health Care Svc          Follow-up Information     [facility] Follow up.   Specialty: [place] Why: to provide home health physical therapy visits Contact information: [address] [place] [address] [phone]         Mcarthur Rossetti, MD Follow up in 2 week(s).   Specialty: Orthopedic Surgery Contact information: [address] [place] [address] [phone]                  Signed: Erskine Emery 02/26/2022, 8:40 AM

## 2022-02-26 NOTE — Evaluation (Signed)
Physical Therapy Evaluation Patient Details Name: Breanna Bridges MRN: [id number] DOB: 10-Feb-1956 Today's Date: 02/26/2022  History of Present Illness  Pt s/p R THR  Clinical Impression  Pt s/p R THR and presents with decreased R LE strength/ROM and post op pain limiting functional mobility.  Pt should progress to dc home with family assist.     Recommendations for follow up therapy are one component of a multi-disciplinary discharge planning process, led by the attending physician.  Recommendations may be updated based on patient status, additional functional criteria and insurance authorization.  Follow Up Recommendations Follow physician's recommendations for discharge plan and follow up therapies    Assistance Recommended at Discharge Frequent or constant Supervision/Assistance  Patient can return home with the following  A little help with walking and/or transfers;A little help with bathing/dressing/bathroom;Assistance with cooking/housework;Assist for transportation;Help with stairs or ramp for entrance    Equipment Recommendations Rolling walker (2 wheels) (already delivered to room)  Recommendations for Other Services       Functional Status Assessment Patient has had a recent decline in their functional status and demonstrates the ability to make significant improvements in function in a reasonable and predictable amount of time.     Precautions / Restrictions Precautions Precautions: Fall Restrictions Weight Bearing Restrictions: No      Mobility  Bed Mobility Overal bed mobility: Needs Assistance Bed Mobility: Supine to Sit     Supine to sit: Min assist     General bed mobility comments: increased time with cues for sequence and use of L LE to self assist    Transfers Overall transfer level: Needs assistance Equipment used: Rolling walker (2 wheels) Transfers: Sit to/from Stand Sit to Stand: Min assist           General transfer comment: cues for  LE management and use of UEs to self assist    Ambulation/Gait Ambulation/Gait assistance: Min assist Gait Distance (Feet): 38 Feet Assistive device: Rolling walker (2 wheels) Gait Pattern/deviations: Step-to pattern, Decreased step length - right, Decreased step length - left, Shuffle, Trunk flexed Gait velocity: decr     General Gait Details: cues for sequence, posture and position from RW;  Distance ltd by fatigue  Stairs            Wheelchair Mobility    Modified Rankin (Stroke Patients Only)       Balance Overall balance assessment: Needs assistance Sitting-balance support: No upper extremity supported, Feet supported Sitting balance-Leahy Scale: Fair     Standing balance support: Bilateral upper extremity supported Standing balance-Leahy Scale: Poor                               Pertinent Vitals/Pain Pain Assessment Pain Assessment: 0-10 Pain Score: 5  Pain Location: R hip Pain Descriptors / Indicators: Burning Pain Intervention(s): Limited activity within patient's tolerance, Monitored during session, Premedicated before session, Ice applied    Home Living Family/patient expects to be discharged to:: Private residence Living Arrangements: Spouse/significant other Available Help at Discharge: Family;Available 24 hours/day Type of Home: House Home Access: Stairs to enter Entrance Stairs-Rails: Right Entrance Stairs-Number of Steps: 2   Home Layout: One level Home Equipment: Cane - single point;BSC/3in1      Prior Function Prior Level of Function : Independent/Modified Independent                     Hand Dominance   Dominant Hand: Right  Extremity/Trunk Assessment   Upper Extremity Assessment Upper Extremity Assessment: Overall WFL for tasks assessed    Lower Extremity Assessment Lower Extremity Assessment: RLE deficits/detail RLE Deficits / Details: 2/5 strength at hip with AAROM at hip to 90 flex and 15 abd     Cervical / Trunk Assessment Cervical / Trunk Assessment: Normal  Communication   Communication: No difficulties  Cognition Arousal/Alertness: Awake/alert Behavior During Therapy: WFL for tasks assessed/performed Overall Cognitive Status: Within Functional Limits for tasks assessed                                          General Comments      Exercises Total Joint Exercises Ankle Circles/Pumps: AROM, Both, 15 reps, Supine Quad Sets: AROM, Both, 10 reps, Supine Heel Slides: AAROM, Right, Supine, 10 reps Hip ABduction/ADduction: AAROM, Right, 10 reps, Supine   Assessment/Plan    PT Assessment Patient needs continued PT services  PT Problem List Decreased strength;Decreased range of motion;Decreased activity tolerance;Decreased balance;Decreased mobility;Decreased knowledge of use of DME;Pain       PT Treatment Interventions DME instruction;Gait training;Stair training;Functional mobility training;Therapeutic activities;Therapeutic exercise;Patient/family education    PT Goals (Current goals can be found in the Care Plan section)  Acute Rehab PT Goals Patient Stated Goal: REgain IND PT Goal Formulation: With patient Time For Goal Achievement: 03/05/22 Potential to Achieve Goals: Good    Frequency 7X/week     Co-evaluation               AM-PAC PT "6 Clicks" Mobility  Outcome Measure Help needed turning from your back to your side while in a flat bed without using bedrails?: A Little Help needed moving from lying on your back to sitting on the side of a flat bed without using bedrails?: A Little Help needed moving to and from a bed to a chair (including a wheelchair)?: A Little Help needed standing up from a chair using your arms (e.g., wheelchair or bedside chair)?: A Little Help needed to walk in hospital room?: A Little Help needed climbing 3-5 steps with a railing? : A Lot 6 Click Score: 17    End of Session Equipment Utilized During  Treatment: Gait belt Activity Tolerance: Patient tolerated treatment well;Patient limited by fatigue Patient left: in chair;with call bell/phone within reach;with chair alarm set Nurse Communication: Mobility status PT Visit Diagnosis: Difficulty in walking, not elsewhere classified (R26.2)    Time: 1287-8676 PT Time Calculation (min) (ACUTE ONLY): 28 min   Charges:   PT Evaluation $PT Eval Low Complexity: 1 Low PT Treatments $Therapeutic Exercise: 8-22 mins        Debe Coder PT Acute Rehabilitation Services Pager [phone] Office [phone]   Haley Roza 02/26/2022, 12:16 PM

## 2022-02-26 NOTE — Discharge Instructions (Signed)

## 2022-02-26 NOTE — Plan of Care (Signed)
  Problem: Clinical Measurements: Goal: Respiratory complications will improve Outcome: Not Applicable Goal: Cardiovascular complication will be avoided Outcome: Not Applicable   Problem: Activity: Goal: Risk for activity intolerance will decrease Outcome: Progressing   Problem: Nutrition: Goal: Adequate nutrition will be maintained Outcome: Progressing   Problem: Pain Managment: Goal: General experience of comfort will improve Outcome: Progressing

## 2022-02-26 NOTE — Progress Notes (Signed)
Subjective: 1 Day Post-Op Procedure(s) (LRB): RIGHT TOTAL HIP ARTHROPLASTY ANTERIOR APPROACH (Right) Patient reports pain as moderate.  Has not bee out of bed due to nausea yesterday. Nausea improved tolerating some food.   Objective: Vital signs in last 24 hours: Temp:  [97.8 F (36.6 C)-98.2 F (36.8 C)] 98.2 F (36.8 C) (06/17 0606) Pulse Rate:  [58-90] 78 (06/17 0606) Resp:  [12-21] 20 (06/17 0606) BP: (109-147)/(53-78) 110/58 (06/17 0606) SpO2:  [94 %-100 %] 95 % (06/17 0606)  Intake/Output from previous day: 06/16 0701 - 06/17 0700 In: 2584.1 [P.O.:300; I.V.:2184.1; IV Piggyback:100] Out: 4695 [Urine:1500; Blood:150] Intake/Output this shift: No intake/output data recorded.  Recent Labs    02/26/22 0318  HGB 10.3*   Recent Labs    02/26/22 0318  WBC 6.0  RBC 3.32*  HCT 31.3*  PLT 178   Recent Labs    02/26/22 0318  NA 138  K 4.5  CL 106  CO2 28  BUN 20  CREATININE 0.87  GLUCOSE 122*  CALCIUM 8.4*   No results for input(s): "LABPT", "INR" in the last 72 hours.  Intact pulses distally Dorsiflexion/Plantar flexion intact Incision: scant drainage Compartment soft   Assessment/Plan: 1 Day Post-Op Procedure(s) (LRB): RIGHT TOTAL HIP ARTHROPLASTY ANTERIOR APPROACH (Right) Up with therapy possible discharge to home later today if does well with PT and remains stable.       Breanna Bridges 02/26/2022, 8:27 AM

## 2022-02-26 NOTE — Plan of Care (Signed)
Plan of care reviewed and discussed with the patient. 

## 2022-02-26 NOTE — Plan of Care (Signed)
  Problem: Education: Goal: Knowledge of General Education information will improve Description: Including pain rating scale, medication(s)/side effects and non-pharmacologic comfort measures Outcome: Progressing   Problem: Activity: Goal: Risk for activity intolerance will decrease Outcome: Progressing   Problem: Pain Managment: Goal: General experience of comfort will improve Outcome: Progressing   

## 2022-02-26 NOTE — Progress Notes (Signed)
Physical Therapy Treatment Patient Details Name: Breanna Bridges MRN: [id number] DOB: 15-Jun-1956 Today's Date: 02/26/2022   History of Present Illness Pt s/p R THR    PT Comments    Pt feeling less nauseous and agreeable to ambulate.  Pt requiring increased time and standing rest breaks but able to ambulate increased distance and with decreased assist level required for all mobility tasks.  Pt hopeful for dc home tomorrow.  Recommendations for follow up therapy are one component of a multi-disciplinary discharge planning process, led by the attending physician.  Recommendations may be updated based on patient status, additional functional criteria and insurance authorization.  Follow Up Recommendations  Follow physician's recommendations for discharge plan and follow up therapies     Assistance Recommended at Discharge Frequent or constant Supervision/Assistance  Patient can return home with the following A little help with walking and/or transfers;A little help with bathing/dressing/bathroom;Assistance with cooking/housework;Assist for transportation;Help with stairs or ramp for entrance   Equipment Recommendations  Rolling walker (2 wheels)    Recommendations for Other Services       Precautions / Restrictions Precautions Precautions: Fall Restrictions Weight Bearing Restrictions: No     Mobility  Bed Mobility Overal bed mobility: Needs Assistance Bed Mobility: Supine to Sit, Sit to Supine     Supine to sit: Min guard Sit to supine: Min assist   General bed mobility comments: increased time with cues for sequence and use of L LE to self assist    Transfers Overall transfer level: Needs assistance Equipment used: Rolling walker (2 wheels) Transfers: Sit to/from Stand Sit to Stand: Min guard           General transfer comment: cues for LE management and use of UEs to self assist    Ambulation/Gait Ambulation/Gait assistance: Min assist, Min guard Gait  Distance (Feet): 78 Feet Assistive device: Rolling walker (2 wheels) Gait Pattern/deviations: Step-to pattern, Decreased step length - right, Decreased step length - left, Shuffle, Trunk flexed Gait velocity: decr     General Gait Details: Increased time with cues for sequence, posture and position from RW;  Distance ltd by fatigue   Stairs             Wheelchair Mobility    Modified Rankin (Stroke Patients Only)       Balance Overall balance assessment: Needs assistance Sitting-balance support: No upper extremity supported, Feet supported Sitting balance-Leahy Scale: Fair     Standing balance support: Single extremity supported Standing balance-Leahy Scale: Poor                              Cognition Arousal/Alertness: Awake/alert Behavior During Therapy: WFL for tasks assessed/performed Overall Cognitive Status: Within Functional Limits for tasks assessed                                          Exercises Total Joint Exercises Ankle Circles/Pumps: AROM, Both, 15 reps, Supine Quad Sets: AROM, Both, 10 reps, Supine Heel Slides: AAROM, Right, Supine, 10 reps Hip ABduction/ADduction: AAROM, Right, 10 reps, Supine    General Comments        Pertinent Vitals/Pain Pain Assessment Pain Assessment: 0-10 Pain Score: 6  Pain Location: R hip Pain Descriptors / Indicators: Burning Pain Intervention(s): Limited activity within patient's tolerance, Monitored during session, Premedicated before session, Ice applied  Home Living                          Prior Function            PT Goals (current goals can now be found in the care plan section) Acute Rehab PT Goals Patient Stated Goal: REgain IND PT Goal Formulation: With patient Time For Goal Achievement: 03/05/22 Potential to Achieve Goals: Good Progress towards PT goals: Progressing toward goals    Frequency    7X/week      PT Plan Current plan remains  appropriate    Co-evaluation              AM-PAC PT "6 Clicks" Mobility   Outcome Measure  Help needed turning from your back to your side while in a flat bed without using bedrails?: A Little Help needed moving from lying on your back to sitting on the side of a flat bed without using bedrails?: A Little Help needed moving to and from a bed to a chair (including a wheelchair)?: A Little Help needed standing up from a chair using your arms (e.g., wheelchair or bedside chair)?: A Little Help needed to walk in hospital room?: A Little Help needed climbing 3-5 steps with a railing? : A Lot 6 Click Score: 17    End of Session Equipment Utilized During Treatment: Gait belt Activity Tolerance: Patient tolerated treatment well;Patient limited by fatigue Patient left: in bed;with call bell/phone within reach;with family/visitor present;with bed alarm set Nurse Communication: Mobility status PT Visit Diagnosis: Difficulty in walking, not elsewhere classified (R26.2)     Time: 6168-3729 PT Time Calculation (min) (ACUTE ONLY): 28 min  Charges:  $Gait Training: 23-37 mins                     [place] Pager [phone] Office [phone]    Golden Gilreath 02/26/2022, 4:37 PM

## 2022-02-27 DIAGNOSIS — Z79899 Other long term (current) drug therapy: Secondary | ICD-10-CM | POA: Diagnosis not present

## 2022-02-27 DIAGNOSIS — M1611 Unilateral primary osteoarthritis, right hip: Secondary | ICD-10-CM | POA: Diagnosis not present

## 2022-02-27 DIAGNOSIS — Z87891 Personal history of nicotine dependence: Secondary | ICD-10-CM | POA: Diagnosis not present

## 2022-02-27 DIAGNOSIS — Z7982 Long term (current) use of aspirin: Secondary | ICD-10-CM | POA: Diagnosis not present

## 2022-02-27 NOTE — Plan of Care (Signed)
  Problem: Education: Goal: Knowledge of General Education information will improve Description: Including pain rating scale, medication(s)/side effects and non-pharmacologic comfort measures Outcome: Progressing   Problem: Activity: Goal: Risk for activity intolerance will decrease Outcome: Progressing   Problem: Pain Managment: Goal: General experience of comfort will improve Outcome: Progressing   

## 2022-02-27 NOTE — Progress Notes (Signed)
Patient ID: Breanna Bridges, female   DOB: 1955-09-28, 66 y.o.   MRN: [id number] The patient needed to stay yesterday secondary to nausea.  Her nausea has improved significantly and she is working well with therapy.  She can be discharged home today.

## 2022-02-27 NOTE — Discharge Summary (Signed)
Patient ID: Breanna Bridges MRN: [id number] DOB/AGE: 1956/05/10 66 y.o.  Admit date: 02/25/2022 Discharge date: 02/27/2022  Admission Diagnoses:  Principal Problem:   Unilateral primary osteoarthritis, right hip Active Problems:   Status post total replacement of right hip   Discharge Diagnoses:  Same  Past Medical History:  Diagnosis Date   Abnormal Pap smear    CIN I-LEEP   Allergy    Anemia    takes a multi vit   Arthritis    back    Cataract 2/23   Depression with anxiety 01/18/2015   GERD (gastroesophageal reflux disease)    HSV infection    Other and unspecified hyperlipidemia 05/20/2013   Stress fracture 2008   left foot   Trigger finger of left hand    ring finger    Surgeries: Procedure(s): RIGHT TOTAL HIP ARTHROPLASTY ANTERIOR APPROACH on 02/25/2022   Consultants:   Discharged Condition: Improved  Hospital Course: BRINDLE Bridges is an 66 y.o. female who was admitted 02/25/2022 for operative treatment ofUnilateral primary osteoarthritis, right hip. Patient has severe unremitting pain that affects sleep, daily activities, and work/hobbies. After pre-op clearance the patient was taken to the operating room on 02/25/2022 and underwent  Procedure(s): RIGHT TOTAL HIP ARTHROPLASTY ANTERIOR APPROACH.    Patient was given perioperative antibiotics:  Anti-infectives (From admission, onward)    Start     Dose/Rate Route Frequency Ordered Stop   02/26/22 1000  valACYclovir (VALTREX) tablet 1,000 mg        1,000 mg Oral Daily 02/25/22 1253     02/25/22 1430  ceFAZolin (ANCEF) IVPB 1 g/50 mL premix        1 g 100 mL/hr over 30 Minutes Intravenous Every 6 hours 02/25/22 1253 02/25/22 2136   02/25/22 0630  ceFAZolin (ANCEF) IVPB 2g/100 mL premix        2 g 200 mL/hr over 30 Minutes Intravenous On call to O.R. 02/25/22 9211 02/25/22 0829        Patient was given sequential compression devices, early ambulation, and chemoprophylaxis to prevent DVT.  Patient  benefited maximally from hospital stay and there were no complications.    Recent vital signs: Patient Vitals for the past 24 hrs:  BP Temp Temp src Pulse Resp SpO2  02/27/22 0537 (!) 145/69 98.8 F (37.1 C) Oral 89 16 93 %  02/26/22 2155 (!) 151/69 99.1 F (37.3 C) Oral 96 16 94 %  02/26/22 1434 128/60 99.2 F (37.3 C) Oral 83 16 98 %     Recent laboratory studies:  Recent Labs    02/26/22 0318  WBC 6.0  HGB 10.3*  HCT 31.3*  PLT 178  NA 138  K 4.5  CL 106  CO2 28  BUN 20  CREATININE 0.87  GLUCOSE 122*  CALCIUM 8.4*     Discharge Medications:   Allergies as of 02/27/2022       Reactions   Codeine Nausea Only        Medication List     STOP taking these medications    aspirin 81 MG tablet Replaced by: aspirin 81 MG chewable tablet       TAKE these medications    aspirin 81 MG chewable tablet Chew 1 tablet (81 mg total) by mouth 2 (two) times daily. Replaces: aspirin 81 MG tablet   CALCIUM 600/VITAMIN D3 PO Take 1 tablet by mouth daily.   cetirizine 10 MG tablet Commonly known as: ZYRTEC Take 10 mg by mouth daily.  famciclovir 250 MG tablet Commonly known as: FAMVIR Take 1 tablet (250 mg total) by mouth 2 (two) times daily. What changed: when to take this   fluticasone 50 MCG/ACT nasal spray Commonly known as: FLONASE SPRAY ONCE INTO EACH NOSTRIL DAILY. IN [place], [place], AND FALL What changed: See the new instructions.   Krill Oil 500 MG Caps Take 500 mg by mouth daily.   methocarbamol 500 MG tablet Commonly known as: ROBAXIN Take 1 tablet (500 mg total) by mouth every 6 (six) hours as needed for muscle spasms.   ondansetron 4 MG tablet Commonly known as: ZOFRAN Take 1 tablet (4 mg total) by mouth every 6 (six) hours as needed for nausea.   oxyCODONE 5 MG immediate release tablet Commonly known as: Oxy IR/ROXICODONE Take 1-2 tablets (5-10 mg total) by mouth every 6 (six) hours as needed for moderate pain (pain score 4-6).    pantoprazole 40 MG tablet Commonly known as: PROTONIX TAKE 1 TABLET BY MOUTH EVERY DAY   PreserVision AREDS 2+Multi Vit Caps Take 1 capsule by mouth daily.   multivitamin with minerals tablet Take 1 tablet by mouth daily.   PROBIOTIC PO Take 1 capsule by mouth daily.   venlafaxine XR 75 MG 24 hr capsule Commonly known as: EFFEXOR-XR TAKE ONE CAPSULE BY MOUTH EVERY DAY WITH BREAKFAST               Durable Medical Equipment  (From admission, onward)           Start     Ordered   02/25/22 1254  DME 3 n 1  Once        02/25/22 1253   02/25/22 1254  DME Walker rolling  Once       Question Answer Comment  Walker: With 5 Inch Wheels   Patient needs a walker to treat with the following condition Status post total replacement of right hip      02/25/22 1253            Diagnostic Studies: DG Pelvis Portable  Result Date: 02/25/2022 CLINICAL DATA:  Status post right hip replacement EXAM: PORTABLE PELVIS 1-2 VIEWS COMPARISON:  None Available. FINDINGS: Interval right total hip arthroplasty. Postsurgical changes in the soft tissues around the right hip. No hip fracture or dislocation. No aggressive osseous lesion. Normal alignment. Soft tissue are unremarkable. IMPRESSION: 1. Interval right total hip arthroplasty. Electronically Signed   By: Kathreen Devoid M.D.   On: 02/25/2022 10:37   DG HIP UNILAT WITH PELVIS 1V RIGHT  Result Date: 02/25/2022 CLINICAL DATA:  Total right hip arthroplasty. EXAM: DG HIP (WITH OR WITHOUT PELVIS) 1V RIGHT COMPARISON:  None Available. FINDINGS: Multiple intraoperative fluoroscopic spot images are provided. Interval right total hip arthroplasty. FLUOROSCOPY TIME:  Radiation Exposure Index: 2.84 mGy IMPRESSION: 1. Interval right total hip arthroplasty. Electronically Signed   By: Kathreen Devoid M.D.   On: 02/25/2022 10:15   DG C-Arm 1-60 Min-No Report  Result Date: 02/25/2022 Fluoroscopy was utilized by the requesting physician.  No radiographic  interpretation.    Disposition: Discharge disposition: 01-Home or [place] [place] Follow up.   Specialty: [place] Why: to provide home health physical therapy visits Contact information: [address] [place] [address] [phone]         Mcarthur Rossetti, MD Follow up in 2 week(s).   Specialty: Orthopedic  Surgery Contact information: [address] [place] [address] [phone]                  Signed: Mcarthur Rossetti 02/27/2022, 9:26 AM

## 2022-02-27 NOTE — Progress Notes (Signed)
Physical Therapy Treatment Patient Details Name: Breanna Bridges MRN: [id number] DOB: Sep 25, 1955 Today's Date: 02/27/2022   History of Present Illness Pt s/p R THR    PT Comments    Pt in fair spirits and continues to progress steadily with mobility - no c/o nausea this am.  Pt performed therex program, ambulated limited distance in hall and negotiated stairs.  Pt requesting PT return when spouse is here to complete stair training.   Recommendations for follow up therapy are one component of a multi-disciplinary discharge planning process, led by the attending physician.  Recommendations may be updated based on patient status, additional functional criteria and insurance authorization.  Follow Up Recommendations  Follow physician's recommendations for discharge plan and follow up therapies     Assistance Recommended at Discharge Frequent or constant Supervision/Assistance  Patient can return home with the following A little help with walking and/or transfers;A little help with bathing/dressing/bathroom;Assistance with cooking/housework;Assist for transportation;Help with stairs or ramp for entrance   Equipment Recommendations  Rolling walker (2 wheels)    Recommendations for Other Services       Precautions / Restrictions Precautions Precautions: Fall Restrictions Weight Bearing Restrictions: No     Mobility  Bed Mobility               General bed mobility comments: up in recliner and requests back to same for bfast    Transfers Overall transfer level: Needs assistance Equipment used: Rolling walker (2 wheels) Transfers: Sit to/from Stand Sit to Stand: Supervision           General transfer comment: cues for LE management and use of UEs to self assist    Ambulation/Gait Ambulation/Gait assistance: Min guard, Supervision Gait Distance (Feet): 68 Feet Assistive device: Rolling walker (2 wheels) Gait Pattern/deviations: Step-to pattern, Decreased step  length - right, Decreased step length - left, Shuffle, Trunk flexed Gait velocity: decr     General Gait Details: Increased time with cues for sequence, posture and position from RW;  Distance ltd by fatigue   Stairs Stairs: Yes Stairs assistance: Min assist Stair Management: One rail Right, Step to pattern, Forwards Number of Stairs: 2 General stair comments: cues for sequence and cane placement   Wheelchair Mobility    Modified Rankin (Stroke Patients Only)       Balance Overall balance assessment: Needs assistance Sitting-balance support: No upper extremity supported, Feet supported Sitting balance-Leahy Scale: Fair     Standing balance support: No upper extremity supported Standing balance-Leahy Scale: Fair                              Cognition Arousal/Alertness: Awake/alert Behavior During Therapy: WFL for tasks assessed/performed Overall Cognitive Status: Within Functional Limits for tasks assessed                                          Exercises Total Joint Exercises Ankle Circles/Pumps: AROM, Both, 15 reps, Supine Quad Sets: AROM, Both, 10 reps, Supine Heel Slides: AAROM, Right, Supine, 15 reps Hip ABduction/ADduction: AAROM, Right, Supine, 15 reps    General Comments        Pertinent Vitals/Pain Pain Assessment Pain Assessment: 0-10 Pain Score: 6  Pain Location: R hip Pain Descriptors / Indicators: Burning Pain Intervention(s): Limited activity within patient's tolerance, Monitored during session, Premedicated before session, Ice applied  Home Living                          Prior Function            PT Goals (current goals can now be found in the care plan section) Acute Rehab PT Goals Patient Stated Goal: REgain IND PT Goal Formulation: With patient Potential to Achieve Goals: Good Progress towards PT goals: Progressing toward goals    Frequency    7X/week      PT Plan Current plan  remains appropriate    Co-evaluation              AM-PAC PT "6 Clicks" Mobility   Outcome Measure  Help needed turning from your back to your side while in a flat bed without using bedrails?: A Little Help needed moving from lying on your back to sitting on the side of a flat bed without using bedrails?: A Little Help needed moving to and from a bed to a chair (including a wheelchair)?: A Little Help needed standing up from a chair using your arms (e.g., wheelchair or bedside chair)?: A Little Help needed to walk in hospital room?: A Little Help needed climbing 3-5 steps with a railing? : A Little 6 Click Score: 18    End of Session Equipment Utilized During Treatment: Gait belt Activity Tolerance: Patient tolerated treatment well;Patient limited by fatigue Patient left: in chair;with call bell/phone within reach Nurse Communication: Mobility status PT Visit Diagnosis: Difficulty in walking, not elsewhere classified (R26.2)     Time: 2010-0712 PT Time Calculation (min) (ACUTE ONLY): 33 min  Charges:  $Gait Training: 8-22 mins $Therapeutic Exercise: 8-22 mins                     Debe Coder PT Acute Rehabilitation Services Pager [phone] Office [phone]    Fantashia Shupert 02/27/2022, 9:46 AM

## 2022-02-27 NOTE — Progress Notes (Signed)
Physical Therapy Treatment Patient Details Name: Breanna Bridges MRN: [id number] DOB: 15-Feb-1956 Today's Date: 02/27/2022   History of Present Illness Pt s/p R THR    PT Comments    Pt continues cooperative and progressing well with mobility.  Spouse present to review stairs, car transfers,  and LB dressing.  Multiple questions asked and answered.  Pt eager for dc home.  Recommendations for follow up therapy are one component of a multi-disciplinary discharge planning process, led by the attending physician.  Recommendations may be updated based on patient status, additional functional criteria and insurance authorization.  Follow Up Recommendations  Follow physician's recommendations for discharge plan and follow up therapies     Assistance Recommended at Discharge Frequent or constant Supervision/Assistance  Patient can return home with the following A little help with walking and/or transfers;A little help with bathing/dressing/bathroom;Assistance with cooking/housework;Assist for transportation;Help with stairs or ramp for entrance   Equipment Recommendations  Rolling walker (2 wheels)    Recommendations for Other Services       Precautions / Restrictions Precautions Precautions: Fall Restrictions Weight Bearing Restrictions: No     Mobility  Bed Mobility Overal bed mobility: Needs Assistance Bed Mobility: Sit to Supine       Sit to supine: Min assist   General bed mobility comments: cues for sequence, assist to manage LEs    Transfers Overall transfer level: Needs assistance Equipment used: Rolling walker (2 wheels) Transfers: Sit to/from Stand Sit to Stand: Supervision           General transfer comment: cues for LE management and use of UEs to self assist    Ambulation/Gait Ambulation/Gait assistance: Min guard, Supervision Gait Distance (Feet): 68 Feet Assistive device: Rolling walker (2 wheels) Gait Pattern/deviations: Step-to pattern,  Decreased step length - right, Decreased step length - left, Shuffle, Trunk flexed Gait velocity: decr     General Gait Details: Increased time with cues for sequence, posture and position from RW;  Distance ltd by fatigue   Stairs Stairs: Yes Stairs assistance: Min assist Stair Management: One rail Right, Step to pattern, Forwards Number of Stairs: 2 General stair comments: cues for sequence and cane placement   Wheelchair Mobility    Modified Rankin (Stroke Patients Only)       Balance Overall balance assessment: Needs assistance Sitting-balance support: No upper extremity supported, Feet supported Sitting balance-Leahy Scale: Fair     Standing balance support: No upper extremity supported Standing balance-Leahy Scale: Fair                              Cognition Arousal/Alertness: Awake/alert Behavior During Therapy: WFL for tasks assessed/performed Overall Cognitive Status: Within Functional Limits for tasks assessed                                          Exercises Total Joint Exercises Ankle Circles/Pumps: AROM, Both, 15 reps, Supine Quad Sets: AROM, Both, 10 reps, Supine Heel Slides: AAROM, Right, Supine, 15 reps Hip ABduction/ADduction: AAROM, Right, Supine, 15 reps    General Comments        Pertinent Vitals/Pain Pain Assessment Pain Assessment: 0-10 Pain Score: 6  Pain Location: R hip Pain Descriptors / Indicators: Burning Pain Intervention(s): Limited activity within patient's tolerance    Home Living  Prior Function            PT Goals (current goals can now be found in the care plan section) Acute Rehab PT Goals Patient Stated Goal: REgain IND PT Goal Formulation: With patient Time For Goal Achievement: 03/05/22 Potential to Achieve Goals: Good Progress towards PT goals: Progressing toward goals    Frequency    7X/week      PT Plan Current plan remains  appropriate    Co-evaluation              AM-PAC PT "6 Clicks" Mobility   Outcome Measure  Help needed turning from your back to your side while in a flat bed without using bedrails?: A Little Help needed moving from lying on your back to sitting on the side of a flat bed without using bedrails?: A Little Help needed moving to and from a bed to a chair (including a wheelchair)?: A Little Help needed standing up from a chair using your arms (e.g., wheelchair or bedside chair)?: A Little Help needed to walk in hospital room?: A Little Help needed climbing 3-5 steps with a railing? : A Little 6 Click Score: 18    End of Session Equipment Utilized During Treatment: Gait belt Activity Tolerance: Patient tolerated treatment well;Patient limited by fatigue Patient left: with call bell/phone within reach;in bed;with family/visitor present Nurse Communication: Mobility status PT Visit Diagnosis: Difficulty in walking, not elsewhere classified (R26.2)     Time: 1030-1059 PT Time Calculation (min) (ACUTE ONLY): 29 min  Charges:  $Gait Training: 8-22 mins $Therapeutic Exercise: 8-22 mins $Therapeutic Activity: 8-22 mins                     [place] Pager [phone] Office [phone]    Justus Duerr 02/27/2022, 11:54 AM

## 2022-02-27 NOTE — Progress Notes (Signed)
Patient leaving with DME walker

## 2022-02-28 ENCOUNTER — Telehealth: Payer: Self-pay | Admitting: *Deleted

## 2022-02-28 ENCOUNTER — Encounter (HOSPITAL_COMMUNITY): Payer: Self-pay | Admitting: Orthopaedic Surgery

## 2022-02-28 DIAGNOSIS — T7840XD Allergy, unspecified, subsequent encounter: Secondary | ICD-10-CM | POA: Diagnosis not present

## 2022-02-28 DIAGNOSIS — D649 Anemia, unspecified: Secondary | ICD-10-CM | POA: Diagnosis not present

## 2022-02-28 DIAGNOSIS — Z96641 Presence of right artificial hip joint: Secondary | ICD-10-CM | POA: Diagnosis not present

## 2022-02-28 DIAGNOSIS — Z7982 Long term (current) use of aspirin: Secondary | ICD-10-CM | POA: Diagnosis not present

## 2022-02-28 DIAGNOSIS — M4690 Unspecified inflammatory spondylopathy, site unspecified: Secondary | ICD-10-CM | POA: Diagnosis not present

## 2022-02-28 DIAGNOSIS — Z87891 Personal history of nicotine dependence: Secondary | ICD-10-CM | POA: Diagnosis not present

## 2022-02-28 DIAGNOSIS — K219 Gastro-esophageal reflux disease without esophagitis: Secondary | ICD-10-CM | POA: Diagnosis not present

## 2022-02-28 DIAGNOSIS — Z471 Aftercare following joint replacement surgery: Secondary | ICD-10-CM | POA: Diagnosis not present

## 2022-02-28 DIAGNOSIS — E782 Mixed hyperlipidemia: Secondary | ICD-10-CM | POA: Diagnosis not present

## 2022-02-28 NOTE — Telephone Encounter (Signed)
Attempted Ortho bundle D/C call to patient; no answer and left VM requesting call back.

## 2022-03-03 ENCOUNTER — Telehealth: Payer: Self-pay | Admitting: *Deleted

## 2022-03-03 ENCOUNTER — Other Ambulatory Visit: Payer: Self-pay | Admitting: Orthopaedic Surgery

## 2022-03-03 DIAGNOSIS — Z96641 Presence of right artificial hip joint: Secondary | ICD-10-CM | POA: Diagnosis not present

## 2022-03-03 DIAGNOSIS — D649 Anemia, unspecified: Secondary | ICD-10-CM | POA: Diagnosis not present

## 2022-03-03 DIAGNOSIS — E782 Mixed hyperlipidemia: Secondary | ICD-10-CM | POA: Diagnosis not present

## 2022-03-03 DIAGNOSIS — Z7982 Long term (current) use of aspirin: Secondary | ICD-10-CM | POA: Diagnosis not present

## 2022-03-03 DIAGNOSIS — M4690 Unspecified inflammatory spondylopathy, site unspecified: Secondary | ICD-10-CM | POA: Diagnosis not present

## 2022-03-03 DIAGNOSIS — Z87891 Personal history of nicotine dependence: Secondary | ICD-10-CM | POA: Diagnosis not present

## 2022-03-03 DIAGNOSIS — K219 Gastro-esophageal reflux disease without esophagitis: Secondary | ICD-10-CM | POA: Diagnosis not present

## 2022-03-03 DIAGNOSIS — T7840XD Allergy, unspecified, subsequent encounter: Secondary | ICD-10-CM | POA: Diagnosis not present

## 2022-03-03 DIAGNOSIS — Z471 Aftercare following joint replacement surgery: Secondary | ICD-10-CM | POA: Diagnosis not present

## 2022-03-03 MED ORDER — OXYCODONE HCL 5 MG PO CAPS: 5.0000 mg | ORAL_CAPSULE | Freq: Four times a day (QID) | ORAL | 0 refills | Status: DC | PRN

## 2022-03-03 NOTE — Telephone Encounter (Signed)
Call to patient to check status. She requested refill of pain medication. I checked with her pharmacy and they do have Oxy IR 5mg  in capsules only at this time. Thanks.

## 2022-03-04 DIAGNOSIS — Z96641 Presence of right artificial hip joint: Secondary | ICD-10-CM | POA: Diagnosis not present

## 2022-03-04 DIAGNOSIS — D649 Anemia, unspecified: Secondary | ICD-10-CM | POA: Diagnosis not present

## 2022-03-04 DIAGNOSIS — M4690 Unspecified inflammatory spondylopathy, site unspecified: Secondary | ICD-10-CM | POA: Diagnosis not present

## 2022-03-04 DIAGNOSIS — Z7982 Long term (current) use of aspirin: Secondary | ICD-10-CM | POA: Diagnosis not present

## 2022-03-04 DIAGNOSIS — T7840XD Allergy, unspecified, subsequent encounter: Secondary | ICD-10-CM | POA: Diagnosis not present

## 2022-03-04 DIAGNOSIS — K219 Gastro-esophageal reflux disease without esophagitis: Secondary | ICD-10-CM | POA: Diagnosis not present

## 2022-03-04 DIAGNOSIS — Z87891 Personal history of nicotine dependence: Secondary | ICD-10-CM | POA: Diagnosis not present

## 2022-03-04 DIAGNOSIS — E782 Mixed hyperlipidemia: Secondary | ICD-10-CM | POA: Diagnosis not present

## 2022-03-04 DIAGNOSIS — Z471 Aftercare following joint replacement surgery: Secondary | ICD-10-CM | POA: Diagnosis not present

## 2022-03-07 DIAGNOSIS — Z96641 Presence of right artificial hip joint: Secondary | ICD-10-CM | POA: Diagnosis not present

## 2022-03-07 DIAGNOSIS — D649 Anemia, unspecified: Secondary | ICD-10-CM | POA: Diagnosis not present

## 2022-03-07 DIAGNOSIS — Z7982 Long term (current) use of aspirin: Secondary | ICD-10-CM | POA: Diagnosis not present

## 2022-03-07 DIAGNOSIS — E782 Mixed hyperlipidemia: Secondary | ICD-10-CM | POA: Diagnosis not present

## 2022-03-07 DIAGNOSIS — Z87891 Personal history of nicotine dependence: Secondary | ICD-10-CM | POA: Diagnosis not present

## 2022-03-07 DIAGNOSIS — Z471 Aftercare following joint replacement surgery: Secondary | ICD-10-CM | POA: Diagnosis not present

## 2022-03-07 DIAGNOSIS — K219 Gastro-esophageal reflux disease without esophagitis: Secondary | ICD-10-CM | POA: Diagnosis not present

## 2022-03-07 DIAGNOSIS — M4690 Unspecified inflammatory spondylopathy, site unspecified: Secondary | ICD-10-CM | POA: Diagnosis not present

## 2022-03-07 DIAGNOSIS — T7840XD Allergy, unspecified, subsequent encounter: Secondary | ICD-10-CM | POA: Diagnosis not present

## 2022-03-09 ENCOUNTER — Other Ambulatory Visit: Payer: Self-pay | Admitting: Orthopaedic Surgery

## 2022-03-09 ENCOUNTER — Telehealth: Payer: Self-pay | Admitting: *Deleted

## 2022-03-09 DIAGNOSIS — Z7982 Long term (current) use of aspirin: Secondary | ICD-10-CM | POA: Diagnosis not present

## 2022-03-09 DIAGNOSIS — Z87891 Personal history of nicotine dependence: Secondary | ICD-10-CM | POA: Diagnosis not present

## 2022-03-09 DIAGNOSIS — T7840XD Allergy, unspecified, subsequent encounter: Secondary | ICD-10-CM | POA: Diagnosis not present

## 2022-03-09 DIAGNOSIS — Z471 Aftercare following joint replacement surgery: Secondary | ICD-10-CM | POA: Diagnosis not present

## 2022-03-09 DIAGNOSIS — K219 Gastro-esophageal reflux disease without esophagitis: Secondary | ICD-10-CM | POA: Diagnosis not present

## 2022-03-09 DIAGNOSIS — M4690 Unspecified inflammatory spondylopathy, site unspecified: Secondary | ICD-10-CM | POA: Diagnosis not present

## 2022-03-09 DIAGNOSIS — Z96641 Presence of right artificial hip joint: Secondary | ICD-10-CM | POA: Diagnosis not present

## 2022-03-09 DIAGNOSIS — D649 Anemia, unspecified: Secondary | ICD-10-CM | POA: Diagnosis not present

## 2022-03-09 DIAGNOSIS — E782 Mixed hyperlipidemia: Secondary | ICD-10-CM | POA: Diagnosis not present

## 2022-03-09 MED ORDER — OXYCODONE-ACETAMINOPHEN 5-325 MG PO TABS: 1.0000 | ORAL_TABLET | Freq: Four times a day (QID) | ORAL | 0 refills | Status: DC | PRN

## 2022-03-09 NOTE — Telephone Encounter (Signed)
Patient called requesting refill of pain medication. She received Rx of Oxy IR '5mg'$  last time and all pharmacy had was capsules. I called her pharmacy to see what was in stock. They have Oxy 5/325 in stock. Patient asked if she could go back to tablets instead of capsules because she wanted to be able to cut in half if she wanted at this time. Thank you.

## 2022-03-09 NOTE — Telephone Encounter (Signed)
Call to patient and updated that new Rx was sent to her pharmacy.

## 2022-03-10 ENCOUNTER — Ambulatory Visit (INDEPENDENT_AMBULATORY_CARE_PROVIDER_SITE_OTHER): Payer: Medicare Other | Admitting: Orthopaedic Surgery

## 2022-03-10 ENCOUNTER — Telehealth: Payer: Self-pay | Admitting: *Deleted

## 2022-03-10 ENCOUNTER — Encounter: Payer: Self-pay | Admitting: Orthopaedic Surgery

## 2022-03-10 DIAGNOSIS — Z87891 Personal history of nicotine dependence: Secondary | ICD-10-CM | POA: Diagnosis not present

## 2022-03-10 DIAGNOSIS — E782 Mixed hyperlipidemia: Secondary | ICD-10-CM | POA: Diagnosis not present

## 2022-03-10 DIAGNOSIS — M4690 Unspecified inflammatory spondylopathy, site unspecified: Secondary | ICD-10-CM | POA: Diagnosis not present

## 2022-03-10 DIAGNOSIS — Z471 Aftercare following joint replacement surgery: Secondary | ICD-10-CM | POA: Diagnosis not present

## 2022-03-10 DIAGNOSIS — K219 Gastro-esophageal reflux disease without esophagitis: Secondary | ICD-10-CM | POA: Diagnosis not present

## 2022-03-10 DIAGNOSIS — D649 Anemia, unspecified: Secondary | ICD-10-CM | POA: Diagnosis not present

## 2022-03-10 DIAGNOSIS — Z96641 Presence of right artificial hip joint: Secondary | ICD-10-CM | POA: Diagnosis not present

## 2022-03-10 DIAGNOSIS — T7840XD Allergy, unspecified, subsequent encounter: Secondary | ICD-10-CM | POA: Diagnosis not present

## 2022-03-10 DIAGNOSIS — Z7982 Long term (current) use of aspirin: Secondary | ICD-10-CM | POA: Diagnosis not present

## 2022-03-10 NOTE — Progress Notes (Signed)
The patient is 2-week status post a right total hip arthroplasty.  She is doing well overall and ambulate with a cane.  Her first week was rough but this week is gotten much better.  She has been compliant with a baby aspirin twice a day.  She was on this once a day prior to surgery and can resume just once a day aspirin.  She is been wearing her TED hose as well but has not had a lot of swelling.  We did refill her pain medication yesterday.  She is ambulating with a cane.  Her leg lengths look good and have her lay supine.  Her right hip incision is healing nicely and the staples been removed and Steri-Strips applied.  She will continue to increase her activities as comfort allows.  We will see her back in 4 weeks to see how she is doing overall but no x-rays are needed.

## 2022-03-10 NOTE — Telephone Encounter (Signed)
14 day Ortho bundle meeting completed.

## 2022-03-13 ENCOUNTER — Other Ambulatory Visit: Payer: Self-pay | Admitting: Family Medicine

## 2022-03-22 ENCOUNTER — Other Ambulatory Visit: Payer: Self-pay | Admitting: Orthopaedic Surgery

## 2022-03-22 ENCOUNTER — Telehealth: Payer: Self-pay | Admitting: *Deleted

## 2022-03-22 MED ORDER — OXYCODONE-ACETAMINOPHEN 5-325 MG PO TABS: 1.0000 | ORAL_TABLET | Freq: Three times a day (TID) | ORAL | 0 refills | Status: DC | PRN

## 2022-03-22 NOTE — Telephone Encounter (Signed)
Patient called requesting refill of pain medication. States she is still using a whole one on occasion at night to get comfortable. Thank you.

## 2022-03-29 ENCOUNTER — Other Ambulatory Visit: Payer: Medicare Other

## 2022-04-07 ENCOUNTER — Ambulatory Visit: Payer: Medicare Other | Admitting: Orthopaedic Surgery

## 2022-04-07 ENCOUNTER — Telehealth: Payer: Self-pay | Admitting: *Deleted

## 2022-04-07 ENCOUNTER — Encounter: Payer: Self-pay | Admitting: Orthopaedic Surgery

## 2022-04-07 DIAGNOSIS — Z96641 Presence of right artificial hip joint: Secondary | ICD-10-CM

## 2022-04-07 NOTE — Progress Notes (Signed)
The patient is now 6 weeks status post a right total hip arthroplasty.  She is doing very well.  She says she is sleeping much better and she does have proved range of motion and strength.  She does feel ready to get back in the pool and I am fine with her doing this at this standpoint.  She is walking without assistive device and no significant limp.  There is still some slight stiffness in her right hip on exam but it is improving significantly.  She still gets some electrical shock type symptoms going down her leg but she feels like things are waking up and she is doing great overall.  The next time we need to see her is not really for 6 months unless there are issues.  At that visit we will have a standing low AP pelvis and a lateral of her right operative hip.  If there are issues before then she knows to let us know.

## 2022-04-07 NOTE — Telephone Encounter (Signed)
Ortho bundle 30 day in office meeting/survey completed.

## 2022-04-13 ENCOUNTER — Other Ambulatory Visit: Payer: Self-pay

## 2022-04-13 ENCOUNTER — Encounter: Payer: Self-pay | Admitting: Family Medicine

## 2022-04-13 MED ORDER — FLUTICASONE PROPIONATE 50 MCG/ACT NA SUSP: 2.0000 | Freq: Every day | NASAL | 3 refills | Status: DC | PRN

## 2022-06-06 ENCOUNTER — Encounter (HOSPITAL_BASED_OUTPATIENT_CLINIC_OR_DEPARTMENT_OTHER): Payer: Self-pay | Admitting: Obstetrics & Gynecology

## 2022-06-06 ENCOUNTER — Other Ambulatory Visit (HOSPITAL_COMMUNITY)
Admission: RE | Admit: 2022-06-06 | Discharge: 2022-06-06 | Disposition: A | Discharge status: Home / Self Care | Payer: Medicare Other | Source: Ambulatory Visit | Attending: Obstetrics & Gynecology | Admitting: Obstetrics & Gynecology

## 2022-06-06 ENCOUNTER — Ambulatory Visit (INDEPENDENT_AMBULATORY_CARE_PROVIDER_SITE_OTHER): Payer: Medicare Other | Admitting: Obstetrics & Gynecology

## 2022-06-06 VITALS — BP 144/72 | HR 80 | Ht 60.5 in | Wt 185.8 lb

## 2022-06-06 DIAGNOSIS — B009 Herpesviral infection, unspecified: Secondary | ICD-10-CM

## 2022-06-06 DIAGNOSIS — Z780 Asymptomatic menopausal state: Secondary | ICD-10-CM | POA: Diagnosis not present

## 2022-06-06 DIAGNOSIS — Z124 Encounter for screening for malignant neoplasm of cervix: Secondary | ICD-10-CM | POA: Diagnosis present

## 2022-06-06 DIAGNOSIS — Z9889 Other specified postprocedural states: Secondary | ICD-10-CM | POA: Diagnosis not present

## 2022-06-06 DIAGNOSIS — Z9189 Other specified personal risk factors, not elsewhere classified: Secondary | ICD-10-CM

## 2022-06-06 MED ORDER — FAMCICLOVIR 250 MG PO TABS: 250.0000 mg | ORAL_TABLET | Freq: Two times a day (BID) | ORAL | 4 refills | Status: DC

## 2022-06-06 NOTE — Progress Notes (Signed)
66 y.o. [place] Married White or Caucasian female here for breast and pelvic exam.  I am also following her for h/o HSV.  Denies vaginal bleeding.  Had hip replacement last year.  Has done really well.  Has been retired over a year.  This is great.  Exercising via swimming.    Patient's last menstrual period was 03/02/2011 (approximate).          Sexually active: Yes.     Health Maintenance: PCP:  Dr. Randel Pigg.  Has appt for 11/2022 Vaccines are up to date:  tdap is due.  Pt aware.   Colonoscopy:  01/09/2020, follow up 5 years MMG:  11/17/2021 Negative BMD:  12/23/2021 Normal Last pap smear:  06/04/2019 Negative.   H/o abnormal pap smear:  LEEP 1995.   reports that she quit smoking about 38 years ago. Her smoking use included cigarettes. She smoked an average of .25 packs per day. She has never used smokeless tobacco. She reports current alcohol use of about 14.0 standard drinks of alcohol per week. She reports that she does not use drugs.  Past Medical History:  Diagnosis Date   Abnormal Pap smear    CIN I-LEEP   Allergy    Anemia    takes a multi vit   Arthritis    back    Cataract 2/23   Depression with anxiety 01/18/2015   GERD (gastroesophageal reflux disease)    HSV infection    Other and unspecified hyperlipidemia 05/20/2013   Stress fracture 2008   left foot   Trigger finger of left hand    ring finger    Past Surgical History:  Procedure Laterality Date   ANKLE SURGERY Left 07/2016   BREAST BIOPSY Left 2018   CATARACT EXTRACTION Bilateral 2023   COLONOSCOPY     last 2011 DB normal    EYE SURGERY  2005   Lasik   FOOT SURGERY Left 04/15/2014   left foot-bone spur, Dr. Sharol Given; second surgery 01/2015 to correct prior surgery   FOOT SURGERY Left 01/2015   Dr Beckey Downing at [place] Left 10+ yrs   left foot   LEEP  15+ yrs   normal paps since then   TOTAL HIP ARTHROPLASTY Right 02/25/2022   Procedure: RIGHT TOTAL HIP ARTHROPLASTY ANTERIOR APPROACH;   Surgeon: Mcarthur Rossetti, MD;  Location: [facility];  Service: Orthopedics;  Laterality: Right;   WISDOM TOOTH EXTRACTION      Current Outpatient Medications  Medication Sig Dispense Refill   aspirin 81 MG chewable tablet Chew 1 tablet (81 mg total) by mouth 2 (two) times daily. 35 tablet 0   Calcium Carb-Cholecalciferol (CALCIUM 600/VITAMIN D3 PO) Take 1 tablet by mouth daily.     cetirizine (ZYRTEC) 10 MG tablet Take 10 mg by mouth daily.       fluticasone (FLONASE) 50 MCG/ACT nasal spray Place 2 sprays into both nostrils daily as needed for allergies. 16 g 3   Krill Oil 500 MG CAPS Take 500 mg by mouth daily.     Multiple Vitamins-Minerals (MULTIVITAMIN WITH MINERALS) tablet Take 1 tablet by mouth daily.     Multiple Vitamins-Minerals (PRESERVISION AREDS 2+MULTI VIT) CAPS Take 1 capsule by mouth daily.     pantoprazole (PROTONIX) 40 MG tablet TAKE 1 TABLET BY MOUTH EVERY DAY 90 tablet 3   Probiotic Product (PROBIOTIC PO) Take 1 capsule by mouth daily.     venlafaxine XR (EFFEXOR-XR) 75 MG 24 hr capsule TAKE ONE  CAPSULE BY MOUTH EVERY DAY WITH BREAKFAST 90 capsule 2   famciclovir (FAMVIR) 250 MG tablet Take 1 tablet (250 mg total) by mouth 2 (two) times daily. 180 tablet 4   No current facility-administered medications for this visit.    Family History  Problem Relation Age of Onset   Other Mother        CHF   Diabetes Mother        Borderline   Hypertension Mother    Obesity Mother    Anxiety disorder Mother    Heart disease Mother    Other Father        CHF   Obesity Sister    Diabetes Sister        type 2   Hypertension Sister    Anxiety disorder Sister    Diverticulosis Sister    Hyperlipidemia Sister    Arthritis Sister    Obesity Sister    Anxiety disorder Sister    Fibrocystic breast disease Sister    Thyroid disease Sister    Colon polyps Sister    Obesity Sister    Breast cancer Maternal Aunt    Cancer Maternal Aunt        breast   Breast cancer  Paternal Aunt    Cancer Paternal Aunt        Breast   Obesity Paternal Grandmother    Other Paternal Grandfather        Committed suicide during the [place] Depression   Breast cancer Cousin    Cancer Cousin        recurrent metastatic, 10 year span   Obesity Brother    Anxiety disorder Brother    Arthritis Brother    Colon cancer Neg Hx    Esophageal cancer Neg Hx    Rectal cancer Neg Hx    Stomach cancer Neg Hx     Review of Systems  Constitutional: Negative.   Genitourinary: Negative.     Exam:   BP (!) 144/72 (BP Location: Right Arm, Patient Position: Sitting, Cuff Size: Normal)   Pulse 80   Ht 5' 0.5" (1.537 m) Comment: Reported  Wt 185 lb 12.8 oz (84.3 kg)   LMP 03/02/2011 (Approximate)   BMI 35.69 kg/m   Height: 5' 0.5" (153.7 cm) (Reported)  General appearance: alert, cooperative and appears stated age Breasts: normal appearance, no masses or tenderness Abdomen: soft, non-tender; bowel sounds normal; no masses,  no organomegaly Lymph nodes: Cervical, supraclavicular, and axillary nodes normal.  No abnormal inguinal nodes palpated Neurologic: Grossly normal  Pelvic: External genitalia:  no lesions              Urethra:  normal appearing urethra with no masses, tenderness or lesions              Bartholins and Skenes: normal                 Vagina: normal appearing vagina with atrophic changes and no discharge, no lesions              Cervix: no lesions              Pap taken: Yes.   Bimanual Exam:  Uterus:  normal size, contour, position, consistency, mobility, non-tender              Adnexa: normal adnexa and no mass, fullness, tenderness               Rectovaginal: Confirms  Anus:  normal sphincter tone, no lesions  Chaperone, Octaviano Batty, CMA, was present for exam.  Assessment/Plan: 1. GYN exam for high-risk Medicare patient - Pap smear obtained today - Mammogram 11/2021 - Colonoscopy 2021.  Follow up 5 years. - Bone mineral density 2023 -  lab work done done with PCP - vaccines reviewed/updated.  Tdap is due.  Pt aware.  2. HSV (herpes simplex virus) infection - famciclovir (FAMVIR) 250 MG tablet; Take 1 tablet (250 mg total) by mouth 2 (two) times daily.  Dispense: 180 tablet; Refill: 4  3. History of loop electrical excision procedure (LEEP) - 1995 LEEP with CIN 1  4. Postmenopausal - no HRT  5. Cervical cancer screening - Cytology - PAP( [facility]) - PR OBTAINING SCREEN PAP SMEAR

## 2022-06-07 LAB — CYTOLOGY - PAP: Diagnosis: NEGATIVE

## 2022-06-09 ENCOUNTER — Ambulatory Visit: Payer: Medicare Other | Admitting: Family Medicine

## 2022-06-13 ENCOUNTER — Encounter: Payer: Self-pay | Admitting: Family Medicine

## 2022-06-14 ENCOUNTER — Other Ambulatory Visit: Payer: Self-pay | Admitting: Family Medicine

## 2022-06-14 DIAGNOSIS — F419 Anxiety disorder, unspecified: Secondary | ICD-10-CM

## 2022-06-14 MED ORDER — ALPRAZOLAM 0.25 MG PO TABS: 0.2500 mg | ORAL_TABLET | Freq: Two times a day (BID) | ORAL | 1 refills | Status: DC | PRN

## 2022-06-17 ENCOUNTER — Ambulatory Visit (INDEPENDENT_AMBULATORY_CARE_PROVIDER_SITE_OTHER): Payer: Medicare Other

## 2022-06-17 DIAGNOSIS — Z23 Encounter for immunization: Secondary | ICD-10-CM

## 2022-06-17 NOTE — Progress Notes (Signed)
Patient here for influenza immunization , injection given in left deltoid IM , tolerated well.

## 2022-07-18 ENCOUNTER — Other Ambulatory Visit (HOSPITAL_BASED_OUTPATIENT_CLINIC_OR_DEPARTMENT_OTHER): Payer: Self-pay

## 2022-07-18 MED ORDER — COMIRNATY 30 MCG/0.3ML IM SUSY
PREFILLED_SYRINGE | INTRAMUSCULAR | 0 refills | Status: DC
Start: 2022-07-18 — End: 2022-12-06
  Filled 2022-07-18: qty 0.3, 1d supply, fill #0

## 2022-08-12 ENCOUNTER — Other Ambulatory Visit: Payer: Self-pay | Admitting: Family Medicine

## 2022-08-19 ENCOUNTER — Telehealth: Payer: Self-pay | Admitting: *Deleted

## 2022-08-19 NOTE — Telephone Encounter (Signed)
Ortho bundle call. Left VM requesting call back.

## 2022-09-03 IMAGING — MG MM DIGITAL SCREENING BILAT W/ TOMO AND CAD
8 series · 8 of 24 positions shown · non-contrast
Comparison: Previous exam(s).

CLINICAL DATA: Screening.

EXAM:
DIGITAL SCREENING BILATERAL MAMMOGRAM WITH TOMO AND CAD

[L CC synth-2D]
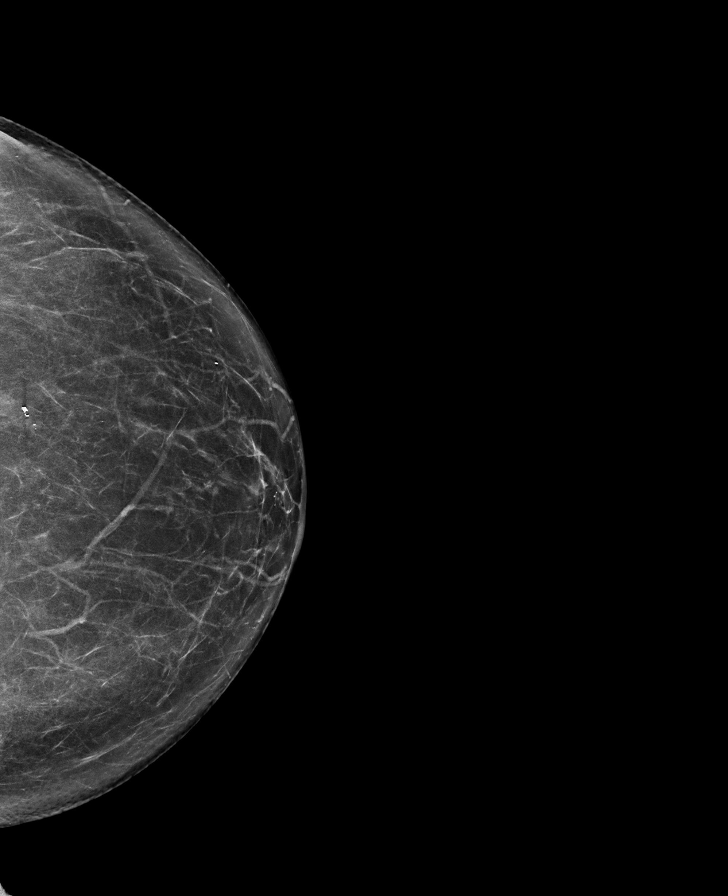

[R CC synth-2D]
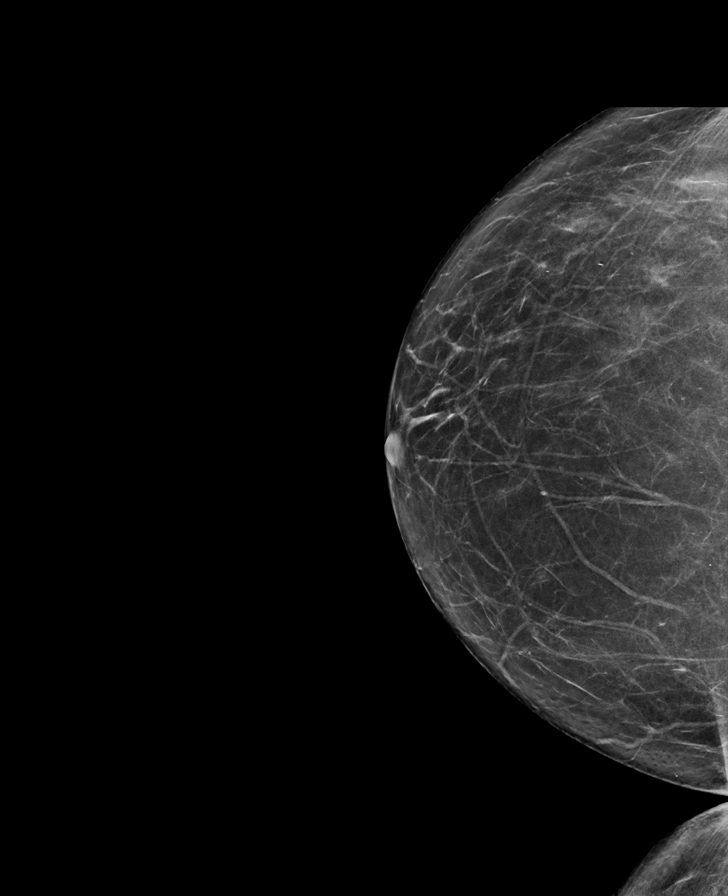

[R MLO synth-2D]
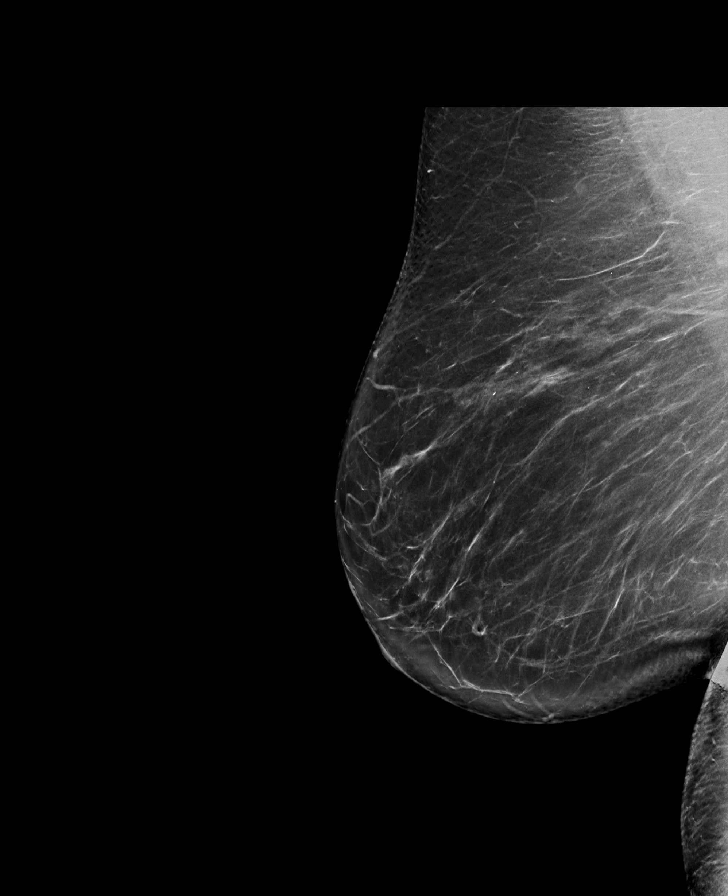

[L MLO synth-2D]
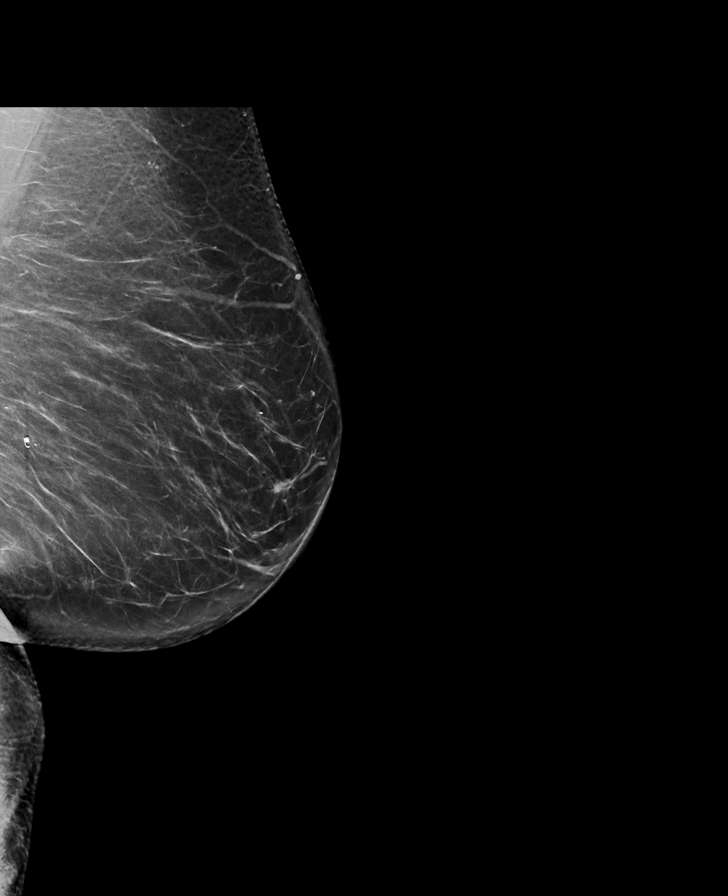

[R CC tomo · tomo slice 34/67.0]
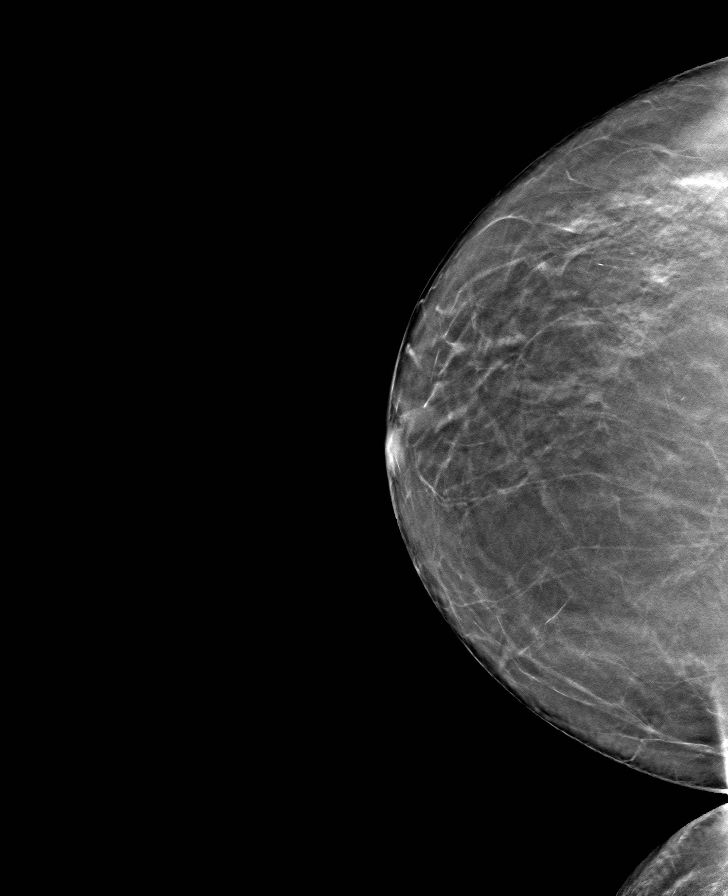

[L MLO tomo · tomo slice 45/88.0]
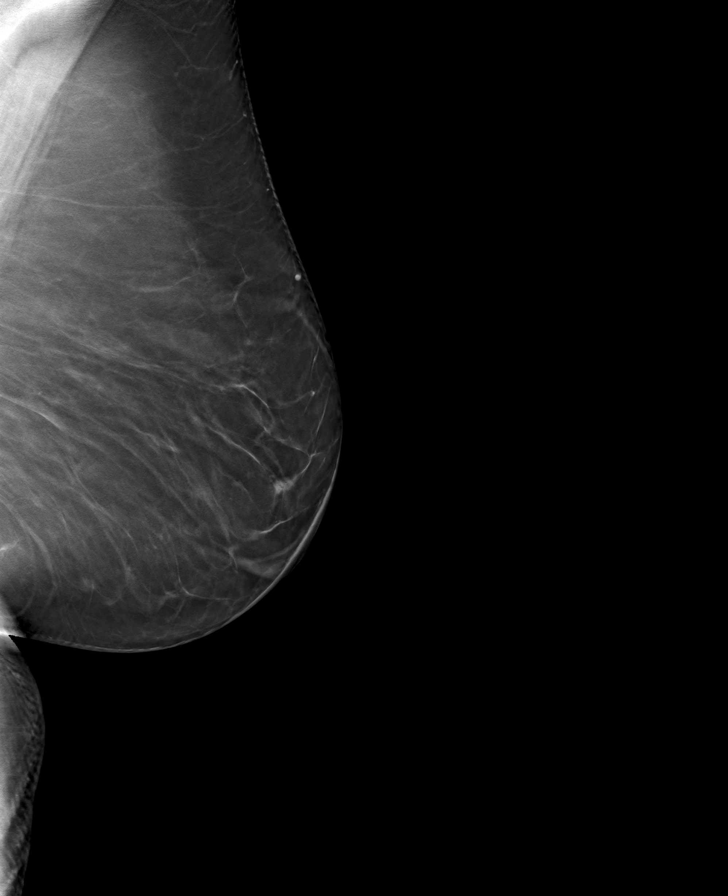

[L CC tomo · tomo slice 37/74.0]
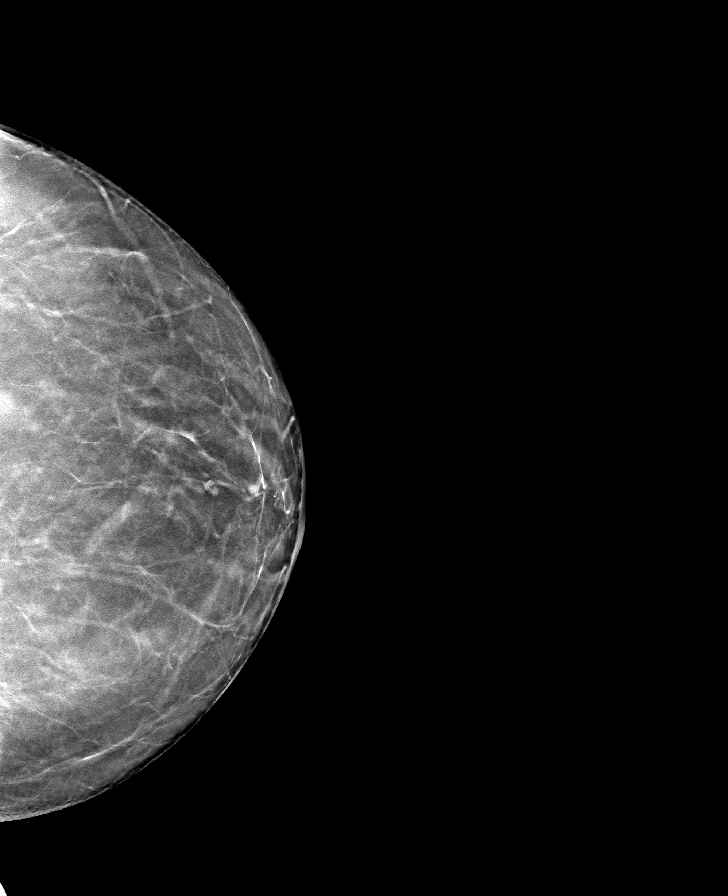

[R MLO tomo · tomo slice 45/89.0]
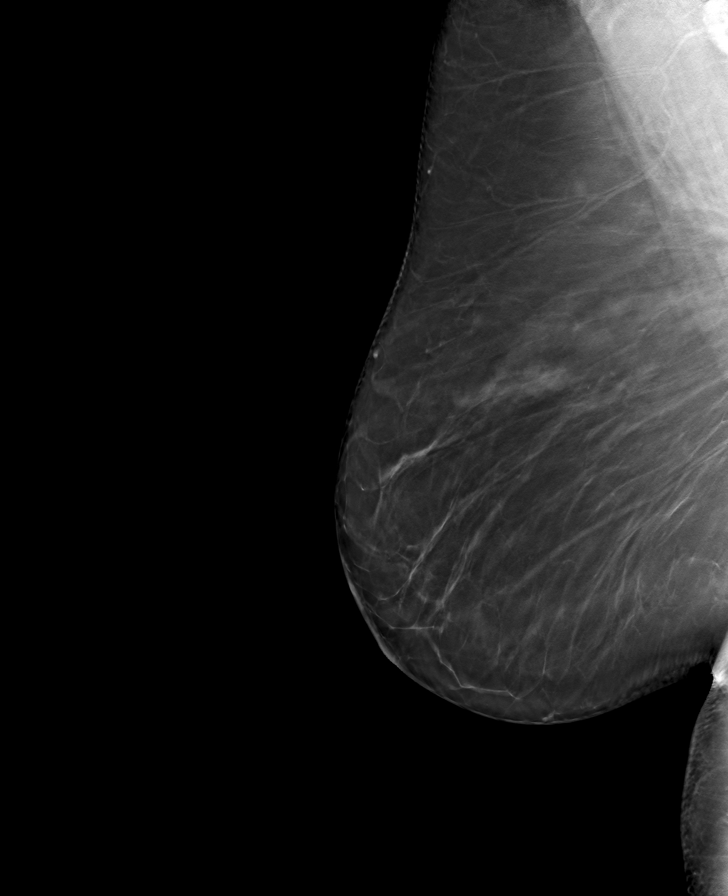

[8 of 24 positions shown; findings below may reference images not displayed]

ACR Breast Density Category b: There are scattered areas of
fibroglandular density.
FINDINGS: There are no findings suspicious for malignancy. The images were
evaluated with computer-aided detection.
IMPRESSION: No mammographic evidence of malignancy. A result letter of this
screening mammogram will be mailed directly to the patient.

RECOMMENDATION:
Screening mammogram in one year. (Code:ZP-7-VX7)

BI-RADS CATEGORY  1: Negative.

## 2022-10-17 ENCOUNTER — Encounter: Payer: Self-pay | Admitting: Orthopaedic Surgery

## 2022-10-17 ENCOUNTER — Ambulatory Visit (INDEPENDENT_AMBULATORY_CARE_PROVIDER_SITE_OTHER): Payer: Medicare Other

## 2022-10-17 ENCOUNTER — Ambulatory Visit (INDEPENDENT_AMBULATORY_CARE_PROVIDER_SITE_OTHER): Payer: Medicare Other | Admitting: Orthopaedic Surgery

## 2022-10-17 DIAGNOSIS — Z96641 Presence of right artificial hip joint: Secondary | ICD-10-CM

## 2022-10-17 NOTE — Progress Notes (Signed)
The patient is now 8 months status post a right total hip arthroplasty.  She is an active 67 year old female.  She says other than just some slight stiffness with putting shoes and socks on and crossing her legs on the right side, she is doing well.  She reports good strength and range of motion.  She is walking without assistive device and states that she is doing great.  She denies any issues with her left hip.  Both right and left hip moves smoothly and equally and have full range of motion.  She does walk with a normal-appearing gait as well.  An AP pelvis and lateral of the right hip shows a well-seated total hip arthroplasty with no complicating features.  The left hip shows no significant arthritic changes on the AP view.  At this point follow-up for right hip can be as needed.  She understands if she does develop any issues with that she needs to let us know immediately.  All questions and concerns were addressed and answered.

## 2022-11-17 ENCOUNTER — Encounter: Payer: Self-pay | Admitting: Radiology

## 2022-11-29 ENCOUNTER — Telehealth: Payer: Self-pay | Admitting: Family Medicine

## 2022-11-29 NOTE — Telephone Encounter (Signed)
Copied from [place] [id number]. Topic: Medicare AWV >> Nov 29, 2022 10:44 AM Devoria Glassing wrote: Reason for CRM: Called patient to schedule Medicare Annual Wellness Visit (AWV). Left message for patient to call back and schedule Medicare Annual Wellness Visit (AWV).  Last date of AWV: 12/03/2021   Please schedule an appointment at any time with Beatris Ship, [place] .  If any questions, please contact me.  Thank you ,  Sherol Dade; [place] Direct Dial: [phone]

## 2022-12-05 NOTE — Assessment & Plan Note (Signed)
Encourage heart healthy diet such as MIND or DASH diet, increase exercise, avoid trans fats, simple carbohydrates and processed foods, consider a krill or fish or flaxseed oil cap daily.  °

## 2022-12-05 NOTE — Assessment & Plan Note (Signed)
hgba1c acceptable, minimize simple carbs. Increase exercise as tolerated.  

## 2022-12-05 NOTE — Assessment & Plan Note (Signed)
Patient encouraged to maintain heart healthy diet, regular exercise, adequate sleep. Consider daily probiotics. Take medications as prescribed Labs ordered and reviewed Given and reviewed copy of ACP documents from [place] of State and encouraged to complete and return  Colonoscopy 2021 repeat in 2026 Pap with gyn 05/2022 repeat every 3-5 years MGM 11/2021 repeat in next year Dexa 2023 repeat every 2-5 years Consider RSV and tetanus

## 2022-12-05 NOTE — Assessment & Plan Note (Signed)
Doing well on Venlafaxine and alprazolam prn

## 2022-12-06 ENCOUNTER — Encounter: Payer: Self-pay | Admitting: Family Medicine

## 2022-12-06 ENCOUNTER — Other Ambulatory Visit (HOSPITAL_BASED_OUTPATIENT_CLINIC_OR_DEPARTMENT_OTHER): Payer: Self-pay

## 2022-12-06 ENCOUNTER — Ambulatory Visit (INDEPENDENT_AMBULATORY_CARE_PROVIDER_SITE_OTHER): Payer: Medicare Other | Admitting: Family Medicine

## 2022-12-06 VITALS — BP 128/74 | HR 86 | Temp 98.0°F | Resp 16 | Ht 60.0 in | Wt 185.8 lb

## 2022-12-06 DIAGNOSIS — F418 Other specified anxiety disorders: Secondary | ICD-10-CM | POA: Diagnosis not present

## 2022-12-06 DIAGNOSIS — R7989 Other specified abnormal findings of blood chemistry: Secondary | ICD-10-CM

## 2022-12-06 DIAGNOSIS — R739 Hyperglycemia, unspecified: Secondary | ICD-10-CM

## 2022-12-06 DIAGNOSIS — Z0000 Encounter for general adult medical examination without abnormal findings: Secondary | ICD-10-CM

## 2022-12-06 DIAGNOSIS — E663 Overweight: Secondary | ICD-10-CM

## 2022-12-06 DIAGNOSIS — E782 Mixed hyperlipidemia: Secondary | ICD-10-CM | POA: Diagnosis not present

## 2022-12-06 LAB — CBC WITH DIFFERENTIAL/PLATELET
Basophils Absolute: 0 10*3/uL (ref 0.0–0.1)
Basophils Relative: 0.7 % (ref 0.0–3.0)
Eosinophils Absolute: 0.1 10*3/uL (ref 0.0–0.7)
Eosinophils Relative: 2 % (ref 0.0–5.0)
HCT: 38.3 % (ref 36.0–46.0)
Hemoglobin: 12.9 g/dL (ref 12.0–15.0)
Lymphocytes Relative: 20.6 % (ref 12.0–46.0)
Lymphs Abs: 1 10*3/uL (ref 0.7–4.0)
MCHC: 33.6 g/dL (ref 30.0–36.0)
MCV: 90.5 fl (ref 78.0–100.0)
Monocytes Absolute: 0.5 10*3/uL (ref 0.1–1.0)
Monocytes Relative: 10.6 % (ref 3.0–12.0)
Neutro Abs: 3.1 10*3/uL (ref 1.4–7.7)
Neutrophils Relative %: 66.1 % (ref 43.0–77.0)
Platelets: 262 10*3/uL (ref 150.0–400.0)
RBC: 4.23 Mil/uL (ref 3.87–5.11)
RDW: 13.8 % (ref 11.5–15.5)
WBC: 4.7 10*3/uL (ref 4.0–10.5)

## 2022-12-06 LAB — COMPREHENSIVE METABOLIC PANEL
ALT: 17 U/L (ref 0–35)
AST: 18 U/L (ref 0–37)
Albumin: 4.6 g/dL (ref 3.5–5.2)
Alkaline Phosphatase: 97 U/L (ref 39–117)
BUN: 20 mg/dL (ref 6–23)
CO2: 28 mEq/L (ref 19–32)
Calcium: 9.7 mg/dL (ref 8.4–10.5)
Chloride: 100 mEq/L (ref 96–112)
Creatinine, Ser: 0.72 mg/dL (ref 0.40–1.20)
GFR: 87.06 mL/min (ref >60.00)
Glucose, Bld: 98 mg/dL (ref 70–99)
Potassium: 4.2 mEq/L (ref 3.5–5.1)
Sodium: 137 mEq/L (ref 135–145)
Total Bilirubin: 0.5 mg/dL (ref 0.2–1.2)
Total Protein: 7.1 g/dL (ref 6.0–8.3)

## 2022-12-06 LAB — HEMOGLOBIN A1C: Hgb A1c MFr Bld: 5.4 % (ref 4.6–6.5)

## 2022-12-06 LAB — LIPID PANEL
Cholesterol: 229 mg/dL — ABNORMAL HIGH (ref 0–200)
HDL: 99.9 mg/dL (ref >39.00)
LDL Cholesterol: 112 mg/dL — ABNORMAL HIGH (ref 0–99)
NonHDL: 129.4
Total CHOL/HDL Ratio: 2
Triglycerides: 87 mg/dL (ref 0.0–149.0)
VLDL: 17.4 mg/dL (ref 0.0–40.0)

## 2022-12-06 LAB — VITAMIN D 25 HYDROXY (VIT D DEFICIENCY, FRACTURES): VITD: 23.27 ng/mL — ABNORMAL LOW (ref 30.00–100.00)

## 2022-12-06 LAB — TSH: TSH: 2.12 u[IU]/mL (ref 0.35–5.50)

## 2022-12-06 MED ORDER — AREXVY 120 MCG/0.5ML IM SUSR
INTRAMUSCULAR | 0 refills | Status: DC
  Filled 2022-12-06: qty 1, 1d supply, fill #0

## 2022-12-06 NOTE — Assessment & Plan Note (Signed)
Supplement and monitor check vitamin D

## 2022-12-06 NOTE — Progress Notes (Signed)
Subjective:   By signing my name below, I, Breanna Bridges, attest that this documentation has been prepared under the direction and in the presence of Breanna Lukes, MD., 12/06/2022.   Patient ID: Breanna Bridges, female    DOB: [birth year], 67 y.o.   MRN: [id number]  Chief Complaint  Patient presents with   Annual Exam    Annual Exam    HPI Patient is in today for a comprehensive physical exam and follow up on chronic medical concerns.  Anxiety/Stress Patient reports that this week has been particularly stressful due to her granddaughter being involved in a motor vehicle accident. She continues taking Alprazolam 0.25 mg twice daily as needed and Venlafaxine 75 mg once daily. She denies CP/palpitations/SOB/HA/fever/ chills/GI or GU symptoms.  Diet/Exercise Patient has been staying active by swimming at the [facility] three times weekly and occasionally working in her garden.   Right Hip Pain Patient is still experiencing mild pain and reduced mobility in the right hip since her right total hip arthroplasty on 02/25/2022. She continues to follow with orthopedic surgeon Dr. Ninfa Linden, MD.  Family History Patient's 58 yo sister Breanna Bridges has been diagnosed with frontotemporal dementia.  Weight Management Patient has recently started the Weight Watchers program and reports that she has lost 5 lbs. Body mass index is 36.29 kg/m.  Wt Readings from Last 3 Encounters:  12/06/22 185 lb 12.8 oz (84.3 kg)  06/06/22 185 lb 12.8 oz (84.3 kg)  02/25/22 188 lb (85.3 kg)   Past Medical History:  Diagnosis Date   Abnormal Pap smear    CIN I-LEEP   Allergy    Anemia    takes a multi vit   Arthritis    back    Cataract 2/23   Depression with anxiety 01/18/2015   GERD (gastroesophageal reflux disease)    HSV infection    Other and unspecified hyperlipidemia 05/20/2013   Stress fracture 2008   left foot   Trigger finger of left hand    ring finger    Past Surgical History:  Procedure  Laterality Date   ANKLE SURGERY Left 07/2016   BREAST BIOPSY Left 2018   CATARACT EXTRACTION Bilateral 2023   COLONOSCOPY     last 2011 DB normal    EYE SURGERY  2005   Lasik   FOOT SURGERY Left 04/15/2014   left foot-bone spur, Dr. Sharol Bridges; second surgery 01/2015 to correct prior surgery   FOOT SURGERY Left 01/2015   Dr Breanna Bridges at [place] Left 10+ yrs   left foot   LEEP  15+ yrs   normal paps since then   TOTAL HIP ARTHROPLASTY Right 02/25/2022   Procedure: RIGHT TOTAL HIP ARTHROPLASTY ANTERIOR APPROACH;  Surgeon: Breanna Rossetti, MD;  Location: [facility];  Service: Orthopedics;  Laterality: Right;   WISDOM TOOTH EXTRACTION      Family History  Problem Relation Age of Onset   Other Mother        CHF   Diabetes Mother        Borderline   Hypertension Mother    Obesity Mother    Anxiety disorder Mother    Heart disease Mother    Other Father        CHF   Obesity Sister    Diabetes Sister        type 2   Hypertension Sister    Anxiety disorder Sister    Diverticulosis Sister    Hyperlipidemia Sister  Arthritis Sister    Obesity Sister    Anxiety disorder Sister    Fibrocystic breast disease Sister    Thyroid disease Sister    Colon polyps Sister    Obesity Sister    Obesity Brother    Anxiety disorder Brother    Arthritis Brother    Breast cancer Maternal Aunt    Cancer Maternal Aunt        breast   Breast cancer Paternal Aunt    Cancer Paternal Aunt        Breast   Obesity Paternal Grandmother    Other Paternal Grandfather        Committed suicide during the [place] Depression   Breast cancer Cousin    Cancer Cousin        recurrent metastatic, 10 year span   Colon cancer Neg Hx    Esophageal cancer Neg Hx    Rectal cancer Neg Hx    Stomach cancer Neg Hx     Social History   Socioeconomic History   Marital status: Married    Spouse name: Not on file   Number of children: Not on file   Years of education: Not on file   Highest  education level: Not on file  Occupational History   Not on file  Tobacco Use   Smoking status: Former    Packs/day: .25    Types: Cigarettes    Quit date: 09/13/1983    Years since quitting: 39.2   Smokeless tobacco: Never  Vaping Use   Vaping Use: Never used  Substance and Sexual Activity   Alcohol use: Yes    Alcohol/week: 14.0 standard drinks of alcohol    Types: 14 Glasses of wine per week   Drug use: No   Sexual activity: Yes    Partners: Male    Birth control/protection: Post-menopausal, None    Comment: no dietary restrictions, lives with husband, 4 dogs  Other Topics Concern   Not on file  Social History Narrative   Lives with [place], husband, no major dietary restrictions, works in Pharmacologist, Presenter, broadcasting, Associate Professor   Social Determinants of Health   Financial Resource Strain: Not on file  Food Insecurity: Not on file  Transportation Needs: No Transportation Needs (12/03/2021)   PRAPARE - Hydrologist (Medical): No    Lack of Transportation (Non-Medical): No  Physical Activity: Not on file  Stress: Not on file  Social Connections: Not on file  Intimate Partner Violence: Not on file    Outpatient Medications Prior to Visit  Medication Sig Dispense Refill   ALPRAZolam (XANAX) 0.25 MG tablet Take 1 tablet (0.25 mg total) by mouth 2 (two) times daily as needed for sleep or anxiety. 10 tablet 1   aspirin 81 MG chewable tablet Chew 1 tablet (81 mg total) by mouth 2 (two) times daily. 35 tablet 0   Calcium Carb-Cholecalciferol (CALCIUM 600/VITAMIN D3 PO) Take 1 tablet by mouth daily.     cetirizine (ZYRTEC) 10 MG tablet Take 10 mg by mouth daily.       famciclovir (FAMVIR) 250 MG tablet Take 1 tablet (250 mg total) by mouth 2 (two) times daily. 180 tablet 4   fluticasone (FLONASE) 50 MCG/ACT nasal spray Place 2 sprays into both nostrils daily as needed for allergies. 16 g 3   Krill Oil 500 MG CAPS Take 500 mg by mouth daily.     Multiple  Vitamins-Minerals (MULTIVITAMIN WITH MINERALS) tablet Take 1 tablet by  mouth daily.     Multiple Vitamins-Minerals (PRESERVISION AREDS 2+MULTI VIT) CAPS Take 1 capsule by mouth daily.     pantoprazole (PROTONIX) 40 MG tablet TAKE 1 TABLET BY MOUTH EVERY DAY 90 tablet 3   Probiotic Product (PROBIOTIC PO) Take 1 capsule by mouth daily.     venlafaxine XR (EFFEXOR-XR) 75 MG 24 hr capsule TAKE ONE CAPSULE BY MOUTH EVERY DAY WITH BREAKFAST 90 capsule 2   COVID-19 mRNA vaccine 2023-2024 (COMIRNATY) syringe Inject into the muscle. 0.3 mL 0   No facility-administered medications prior to visit.    Allergies  Allergen Reactions   Codeine Nausea Only    Review of Systems  Constitutional:  Negative for chills and fever.  Respiratory:  Negative for shortness of breath.   Cardiovascular:  Negative for chest pain and palpitations.  Gastrointestinal:  Negative for abdominal pain, blood in stool, constipation, diarrhea, nausea and vomiting.  Genitourinary:  Negative for dysuria, frequency, hematuria and urgency.  Musculoskeletal:  Positive for joint pain (right hip pain).  Skin:           Neurological:  Negative for headaches.       Objective:    Physical Exam Constitutional:      General: She is not in acute distress.    Appearance: Normal appearance. She is normal weight. She is not ill-appearing.  HENT:     Head: Normocephalic and atraumatic.     Right Ear: Tympanic membrane, ear canal and external ear normal.     Left Ear: Tympanic membrane, ear canal and external ear normal.     Nose: Nose normal.     Mouth/Throat:     Mouth: Mucous membranes are moist.     Pharynx: Oropharynx is clear.  Eyes:     General:        Right eye: No discharge.        Left eye: No discharge.     Extraocular Movements: Extraocular movements intact.     Right eye: No nystagmus.     Left eye: No nystagmus.     Pupils: Pupils are equal, round, and reactive to light.  Neck:     Vascular: No carotid bruit.   Cardiovascular:     Rate and Rhythm: Normal rate and regular rhythm.     Pulses: Normal pulses.     Heart sounds: Normal heart sounds. No murmur heard.    No gallop.  Pulmonary:     Effort: Pulmonary effort is normal. No respiratory distress.     Breath sounds: Normal breath sounds. No wheezing or rales.  Abdominal:     General: Bowel sounds are normal.     Palpations: Abdomen is soft.     Tenderness: There is no abdominal tenderness. There is no guarding.  Musculoskeletal:        General: Normal range of motion.     Cervical back: Normal range of motion.     Right lower leg: No edema.     Left lower leg: No edema.     Comments: Muscle strength 5/5 on upper and lower extremities.   Lymphadenopathy:     Cervical: No cervical adenopathy.  Skin:    General: Skin is warm and dry.  Neurological:     Mental Status: She is alert and oriented to person, place, and time.     Sensory: Sensation is intact.     Motor: Motor function is intact.     Coordination: Coordination is intact.     Deep  Tendon Reflexes:     Reflex Scores:      Patellar reflexes are 2+ on the right side and 2+ on the left side. Psychiatric:        Mood and Affect: Mood normal.        Behavior: Behavior normal.        Judgment: Judgment normal.     BP 128/74 (BP Location: Right Arm, Patient Position: Sitting, Cuff Size: Normal)   Pulse 86   Temp 98 F (36.7 C) (Oral)   Resp 16   Ht 5' (1.524 m)   Wt 185 lb 12.8 oz (84.3 kg)   LMP 03/02/2011 (Approximate)   SpO2 95%   BMI 36.29 kg/m  Wt Readings from Last 3 Encounters:  12/06/22 185 lb 12.8 oz (84.3 kg)  06/06/22 185 lb 12.8 oz (84.3 kg)  02/25/22 188 lb (85.3 kg)    Diabetic Foot Exam - Simple   No data filed    Lab Results  Component Value Date   WBC 4.7 12/06/2022   HGB 12.9 12/06/2022   HCT 38.3 12/06/2022   PLT 262.0 12/06/2022   GLUCOSE 98 12/06/2022   CHOL 229 (H) 12/06/2022   TRIG 87.0 12/06/2022   HDL 99.90 12/06/2022   LDLCALC  112 (H) 12/06/2022   ALT 17 12/06/2022   AST 18 12/06/2022   NA 137 12/06/2022   K 4.2 12/06/2022   CL 100 12/06/2022   CREATININE 0.72 12/06/2022   BUN 20 12/06/2022   CO2 28 12/06/2022   TSH 2.12 12/06/2022   HGBA1C 5.4 12/06/2022    Lab Results  Component Value Date   TSH 2.12 12/06/2022   Lab Results  Component Value Date   WBC 4.7 12/06/2022   HGB 12.9 12/06/2022   HCT 38.3 12/06/2022   MCV 90.5 12/06/2022   PLT 262.0 12/06/2022   Lab Results  Component Value Date   NA 137 12/06/2022   K 4.2 12/06/2022   CO2 28 12/06/2022   GLUCOSE 98 12/06/2022   BUN 20 12/06/2022   CREATININE 0.72 12/06/2022   BILITOT 0.5 12/06/2022   ALKPHOS 97 12/06/2022   AST 18 12/06/2022   ALT 17 12/06/2022   PROT 7.1 12/06/2022   ALBUMIN 4.6 12/06/2022   CALCIUM 9.7 12/06/2022   ANIONGAP 4 (L) 02/26/2022   GFR 87.06 12/06/2022   Lab Results  Component Value Date   CHOL 229 (H) 12/06/2022   Lab Results  Component Value Date   HDL 99.90 12/06/2022   Lab Results  Component Value Date   LDLCALC 112 (H) 12/06/2022   Lab Results  Component Value Date   TRIG 87.0 12/06/2022   Lab Results  Component Value Date   CHOLHDL 2 12/06/2022   Lab Results  Component Value Date   HGBA1C 5.4 12/06/2022      Assessment & Plan:  Colonoscopy: Last completed on 01/09/2020. Impression: - Non- bleeding internal hemorrhoids.  - One 6 mm polyp in the proximal ascending colon, removed piecemeal using a cold snare. Resected and retrieved.  - Diverticulosis in the sigmoid colon. - The examination was otherwise normal on direct and retroflexion views. Repeat in 5 years.  DEXA: Last completed on 12/23/2021. The BMD measured at Forearm Radius 33% is 0.788 g/cm2 with a T-score of -1.0. This patient is considered normal according to [facility]) criteria. Lumbar spine was not utilized due to advanced degenerative changes. The scan quality is good. Repeat in 3-5  years.  Mammogram: Last completed  on 11/17/2021 with no mammographic evidence of malignancy. Repeat in 1-2 years.  Pap Smear: Last completed on 06/06/2022 with normal results. Repeat in 3-5 years.  Advanced Directives: Encouraged patient to complete advanced care planning documents.   Healthy Lifestyle: Encouraged 6-8 hours of sleep, heart healthy diets (DASH, MIND, and PURE), 60-80 oz of non-alcohol/ non-caffeinated fluids, and minimum of 4000 steps daily.  Immunizations: Encouraged patient to consider receiving RSV and Tetanus vaccinations.   Labs: Routine blood work ordered today will check vitamin D levels.  Anxiety/Stress: Well-controlled with Alprazolam 0.25 mg twice daily as needed and Venlafaxine 75 mg once daily.   Weight Management: Patient is currently partaking in the Weight Watchers program.  Problem List Items Addressed This Visit     Abnormal TSH   Relevant Orders   CBC with Differential/Platelet (Completed)   TSH (Completed)   Depression with anxiety    Doing well on Venlafaxine and alprazolam prn      Hyperglycemia - Primary    hgba1c acceptable, minimize simple carbs. Increase exercise as tolerated.       Relevant Orders   Hemoglobin A1c (Completed)   CBC with Differential/Platelet (Completed)   Comprehensive metabolic panel (Completed)   Hyperlipidemia, mixed    Encourage heart healthy diet such as MIND or DASH diet, increase exercise, avoid trans fats, simple carbohydrates and processed foods, consider a krill or fish or flaxseed oil cap daily.       Relevant Orders   CBC with Differential/Platelet (Completed)   Lipid panel (Completed)   Hypocalcemia    Supplement and monitor check vitamin D      Relevant Orders   VITAMIN D 25 Hydroxy (Vit-D Deficiency, Fractures) (Completed)   CBC with Differential/Platelet (Completed)   Overweight    Has joined weight watchers again and has lost 5# Consider DASH, MIND, PURE diets       Preventative health  care    Patient encouraged to maintain heart healthy diet, regular exercise, adequate sleep. Consider daily probiotics. Take medications as prescribed Labs ordered and reviewed Bridges and reviewed copy of ACP documents from [place] of State and encouraged to complete and return  Colonoscopy 2021 repeat in 2026 Pap with gyn 05/2022 repeat every 3-5 years MGM 11/2021 repeat in next year Dexa 2023 repeat every 2-5 years Consider RSV and tetanus      No orders of the defined types were placed in this encounter.  I, Penni Homans, MD, personally preformed the services described in this documentation.  All medical record entries made by the scribe were at my direction and in my presence.  I have reviewed the chart and discharge instructions (if applicable) and agree that the record reflects my personal performance and is accurate and complete. 12/06/2022  I,Mohammed Iqbal,acting as a scribe for Penni Homans, MD.,have documented all relevant documentation on the behalf of Penni Homans, MD,as directed by  Penni Homans, MD while in the presence of Penni Homans, MD.  Penni Homans, MD

## 2022-12-06 NOTE — Patient Instructions (Addendum)
RSV vaccine, Arexvy at pharmacy Tetanus booster  4000, 8000 steps Hydrate 60-80 ounces a week  Preventive Care 65 Years and Older, Female Preventive care refers to lifestyle choices and visits with your health care provider that can promote health and wellness. Preventive care visits are also called wellness exams. What can I expect for my preventive care visit? Counseling Your health care provider may ask you questions about your: Medical history, including: Past medical problems. Family medical history. Pregnancy and menstrual history. History of falls. Current health, including: Memory and ability to understand (cognition). Emotional well-being. Home life and relationship well-being. Sexual activity and sexual health. Lifestyle, including: Alcohol, nicotine or tobacco, and drug use. Access to firearms. Diet, exercise, and sleep habits. Work and work Statistician. Sunscreen use. Safety issues such as seatbelt and bike helmet use. Physical exam Your health care provider will check your: Height and weight. These may be used to calculate your BMI (body mass index). BMI is a measurement that tells if you are at a healthy weight. Waist circumference. This measures the distance around your waistline. This measurement also tells if you are at a healthy weight and may help predict your risk of certain diseases, such as type 2 diabetes and high blood pressure. Heart rate and blood pressure. Body temperature. Skin for abnormal spots. What immunizations do I need?  Vaccines are usually given at various ages, according to a schedule. Your health care provider will recommend vaccines for you based on your age, medical history, and lifestyle or other factors, such as travel or where you work. What tests do I need? Screening Your health care provider may recommend screening tests for certain conditions. This may include: Lipid and cholesterol levels. Hepatitis C test. Hepatitis B  test. HIV (human immunodeficiency virus) test. STI (sexually transmitted infection) testing, if you are at risk. Lung cancer screening. Colorectal cancer screening. Diabetes screening. This is done by checking your blood sugar (glucose) after you have not eaten for a while (fasting). Mammogram. Talk with your health care provider about how often you should have regular mammograms. BRCA-related cancer screening. This may be done if you have a family history of breast, ovarian, tubal, or peritoneal cancers. Bone density scan. This is done to screen for osteoporosis. Talk with your health care provider about your test results, treatment options, and if necessary, the need for more tests. Follow these instructions at home: Eating and drinking  Eat a diet that includes fresh fruits and vegetables, whole grains, lean protein, and low-fat dairy products. Limit your intake of foods with high amounts of sugar, saturated fats, and salt. Take vitamin and mineral supplements as recommended by your health care provider. Do not drink alcohol if your health care provider tells you not to drink. If you drink alcohol: Limit how much you have to 0-1 drink a day. Know how much alcohol is in your drink. In the [place] one drink equals one 12 oz bottle of beer (355 mL), one 5 oz glass of wine (148 mL), or one 1 oz glass of hard liquor (44 mL). Lifestyle Brush your teeth every morning and night with fluoride toothpaste. Floss one time each day. Exercise for at least 30 minutes 5 or more days each week. Do not use any products that contain nicotine or tobacco. These products include cigarettes, chewing tobacco, and vaping devices, such as e-cigarettes. If you need help quitting, ask your health care provider. Do not use drugs. If you are sexually active, practice safe sex. Use a  condom or other form of protection in order to prevent STIs. Take aspirin only as told by your health care provider. Make sure that you  understand how much to take and what form to take. Work with your health care provider to find out whether it is safe and beneficial for you to take aspirin daily. Ask your health care provider if you need to take a cholesterol-lowering medicine (statin). Find healthy ways to manage stress, such as: Meditation, yoga, or listening to music. Journaling. Talking to a trusted person. Spending time with friends and family. Minimize exposure to UV radiation to reduce your risk of skin cancer. Safety Always wear your seat belt while driving or riding in a vehicle. Do not drive: If you have been drinking alcohol. Do not ride with someone who has been drinking. When you are tired or distracted. While texting. If you have been using any mind-altering substances or drugs. Wear a helmet and other protective equipment during sports activities. If you have firearms in your house, make sure you follow all gun safety procedures. What's next? Visit your health care provider once a year for an annual wellness visit. Ask your health care provider how often you should have your eyes and teeth checked. Stay up to date on all vaccines. This information is not intended to replace advice given to you by your health care provider. Make sure you discuss any questions you have with your health care provider. Document Revised: 02/24/2021 Document Reviewed: 02/24/2021 Elsevier Patient Education  [place].

## 2022-12-06 NOTE — Assessment & Plan Note (Addendum)
Has joined weight watchers again and has lost 5# Consider DASH, MIND, PURE diets

## 2022-12-07 ENCOUNTER — Other Ambulatory Visit: Payer: Self-pay

## 2022-12-07 MED ORDER — VITAMIN D (ERGOCALCIFEROL) 1.25 MG (50000 UNIT) PO CAPS: 50000.0000 [IU] | ORAL_CAPSULE | ORAL | 4 refills | Status: DC

## 2023-01-12 ENCOUNTER — Encounter: Payer: Self-pay | Admitting: Family Medicine

## 2023-01-12 DIAGNOSIS — Z1231 Encounter for screening mammogram for malignant neoplasm of breast: Secondary | ICD-10-CM

## 2023-01-14 ENCOUNTER — Other Ambulatory Visit: Payer: Self-pay | Admitting: Family Medicine

## 2023-01-19 ENCOUNTER — Ambulatory Visit (HOSPITAL_BASED_OUTPATIENT_CLINIC_OR_DEPARTMENT_OTHER)
Admission: RE | Admit: 2023-01-19 | Discharge: 2023-01-19 | Disposition: A | Discharge status: Home / Self Care | Payer: Medicare Other | Source: Ambulatory Visit | Attending: Family Medicine | Admitting: Family Medicine

## 2023-01-19 ENCOUNTER — Encounter (HOSPITAL_BASED_OUTPATIENT_CLINIC_OR_DEPARTMENT_OTHER): Payer: Self-pay

## 2023-01-19 DIAGNOSIS — Z1231 Encounter for screening mammogram for malignant neoplasm of breast: Secondary | ICD-10-CM | POA: Diagnosis not present

## 2023-03-08 DIAGNOSIS — H43813 Vitreous degeneration, bilateral: Secondary | ICD-10-CM | POA: Diagnosis not present

## 2023-04-03 ENCOUNTER — Telehealth: Payer: Self-pay | Admitting: *Deleted

## 2023-04-03 ENCOUNTER — Telehealth: Payer: Self-pay | Admitting: Family Medicine

## 2023-04-03 NOTE — Telephone Encounter (Signed)
Ortho bundle 1 year call completed. ?

## 2023-04-03 NOTE — Telephone Encounter (Signed)
Copied from CRM [id number]. Topic: Medicare AWV >> Apr 03, 2023  1:09 PM Payton Doughty wrote: Reason for CRM: LM 04/03/2023 to schedule AWV   Verlee Rossetti; Care Guide Ambulatory Clinical Support Mallory l [facility] Direct Dial: [phone]

## 2023-04-27 ENCOUNTER — Encounter (INDEPENDENT_AMBULATORY_CARE_PROVIDER_SITE_OTHER): Payer: Self-pay

## 2023-05-19 ENCOUNTER — Ambulatory Visit (INDEPENDENT_AMBULATORY_CARE_PROVIDER_SITE_OTHER): Payer: Medicare Other

## 2023-05-19 DIAGNOSIS — Z23 Encounter for immunization: Secondary | ICD-10-CM

## 2023-05-20 ENCOUNTER — Other Ambulatory Visit: Payer: Self-pay | Admitting: Family Medicine

## 2023-05-29 DIAGNOSIS — H18413 Arcus senilis, bilateral: Secondary | ICD-10-CM | POA: Diagnosis not present

## 2023-05-29 DIAGNOSIS — H43313 Vitreous membranes and strands, bilateral: Secondary | ICD-10-CM | POA: Diagnosis not present

## 2023-05-29 DIAGNOSIS — H04123 Dry eye syndrome of bilateral lacrimal glands: Secondary | ICD-10-CM | POA: Diagnosis not present

## 2023-05-29 DIAGNOSIS — H26492 Other secondary cataract, left eye: Secondary | ICD-10-CM | POA: Diagnosis not present

## 2023-05-30 ENCOUNTER — Other Ambulatory Visit: Payer: Self-pay | Admitting: Family Medicine

## 2023-06-06 DIAGNOSIS — Z961 Presence of intraocular lens: Secondary | ICD-10-CM | POA: Diagnosis not present

## 2023-06-08 ENCOUNTER — Ambulatory Visit: Payer: Medicare Other | Admitting: Family Medicine

## 2023-06-12 ENCOUNTER — Ambulatory Visit (HOSPITAL_BASED_OUTPATIENT_CLINIC_OR_DEPARTMENT_OTHER): Payer: Medicare Other | Admitting: Obstetrics & Gynecology

## 2023-06-16 ENCOUNTER — Other Ambulatory Visit (HOSPITAL_BASED_OUTPATIENT_CLINIC_OR_DEPARTMENT_OTHER): Payer: Self-pay | Admitting: Obstetrics & Gynecology

## 2023-06-16 DIAGNOSIS — B009 Herpesviral infection, unspecified: Secondary | ICD-10-CM

## 2023-06-19 DIAGNOSIS — H26491 Other secondary cataract, right eye: Secondary | ICD-10-CM | POA: Diagnosis not present

## 2023-06-21 ENCOUNTER — Ambulatory Visit (HOSPITAL_BASED_OUTPATIENT_CLINIC_OR_DEPARTMENT_OTHER): Payer: Medicare Other | Admitting: Obstetrics & Gynecology

## 2023-06-26 DIAGNOSIS — Z961 Presence of intraocular lens: Secondary | ICD-10-CM | POA: Diagnosis not present

## 2023-07-20 ENCOUNTER — Telehealth: Payer: Self-pay | Admitting: Family Medicine

## 2023-07-20 ENCOUNTER — Other Ambulatory Visit (HOSPITAL_BASED_OUTPATIENT_CLINIC_OR_DEPARTMENT_OTHER): Payer: Self-pay

## 2023-07-20 MED ORDER — COVID-19 MRNA VAC-TRIS(PFIZER) 30 MCG/0.3ML IM SUSY
0.3000 mL | PREFILLED_SYRINGE | Freq: Once | INTRAMUSCULAR | 0 refills | Status: AC
  Filled 2023-07-20: qty 0.3, 1d supply, fill #0

## 2023-07-20 MED ORDER — TETANUS-DIPHTH-ACELL PERTUSSIS 5-2.5-18.5 LF-MCG/0.5 IM SUSY
0.5000 mL | PREFILLED_SYRINGE | Freq: Once | INTRAMUSCULAR | 0 refills | Status: AC
  Filled 2023-07-20: qty 0.5, 1d supply, fill #0

## 2023-07-20 NOTE — Telephone Encounter (Signed)
LVM to call back for AWV scheduling.

## 2023-07-27 DIAGNOSIS — Z961 Presence of intraocular lens: Secondary | ICD-10-CM | POA: Diagnosis not present

## 2023-08-28 ENCOUNTER — Ambulatory Visit (INDEPENDENT_AMBULATORY_CARE_PROVIDER_SITE_OTHER): Payer: Medicare Other

## 2023-08-28 ENCOUNTER — Ambulatory Visit: Payer: Medicare Other | Admitting: Orthopaedic Surgery

## 2023-08-28 ENCOUNTER — Ambulatory Visit (INDEPENDENT_AMBULATORY_CARE_PROVIDER_SITE_OTHER): Payer: Medicare Other | Admitting: Sports Medicine

## 2023-08-28 ENCOUNTER — Encounter: Payer: Self-pay | Admitting: Sports Medicine

## 2023-08-28 ENCOUNTER — Other Ambulatory Visit: Payer: Self-pay

## 2023-08-28 DIAGNOSIS — M25552 Pain in left hip: Secondary | ICD-10-CM

## 2023-08-28 DIAGNOSIS — M1612 Unilateral primary osteoarthritis, left hip: Secondary | ICD-10-CM

## 2023-08-28 MED ORDER — LIDOCAINE HCL 1 % IJ SOLN
4.0000 mL | INTRAMUSCULAR | Status: AC | PRN
  Administered 2023-08-28: 4 mL

## 2023-08-28 MED ORDER — METHYLPREDNISOLONE ACETATE 40 MG/ML IJ SUSP
80.0000 mg | INTRAMUSCULAR | Status: AC | PRN
  Administered 2023-08-28: 80 mg via INTRA_ARTICULAR

## 2023-08-28 NOTE — Progress Notes (Signed)
   Procedure Note  Patient: Breanna Bridges             Date of Birth: [birth year]           MRN: [id number]             Visit Date: 08/28/2023  Procedures: Visit Diagnoses:  1. Pain in left hip   2. Unilateral primary osteoarthritis, left hip    Large Joint Inj: L hip joint on 08/28/2023 8:55 AM Indications: pain Details: 22 G 3.5 in needle, ultrasound-guided anterior approach Medications: 4 mL lidocaine 1 %; 80 mg methylPREDNISolone acetate 40 MG/ML Outcome: tolerated well, no immediate complications  Procedure: US-guided intra-articular hip injection, left After discussion on risks/benefits/indications and informed verbal consent was obtained, a timeout was performed. Patient was lying supine on exam table. The hip was cleaned with betadine and alcohol swabs. Then utilizing ultrasound guidance, the patient's femoral head and neck junction was identified and subsequently injected with 4:2 lidocaine:depomedrol via an in-plane approach with ultrasound visualization of the injectate administered into the hip joint. Patient tolerated procedure well without immediate complications.  Procedure, treatment alternatives, risks and benefits explained, specific risks discussed. Consent was given by the patient. Immediately prior to procedure a time out was called to verify the correct patient, procedure, equipment, support staff and site/side marked as required. Patient was prepped and draped in the usual sterile fashion.     - tolerated well without immediate AE's - follow-up with Dr. Magnus Ivan as indicated in 4 weeks; I am happy to see them as needed  Madelyn Brunner, DO Primary Care Sports Medicine Physician  [facility] - [facility]oped[facility]  This note was dictated using Dragon naturally speaking software and may contain errors in syntax, spelling, or content which have not been identified prior to signing this note.

## 2023-08-28 NOTE — Progress Notes (Signed)
The patient is a 67 year old female who comes in with a 3-week history of left hip and groin pain.  There is been no known injury.  We actually replaced her right hip in June 2023 and that is doing great.  She says the pain is waking her up at night.  It is not severe like when she needed her right hip replaced but she has tried conservative treatment with activity modification and rest as well as taken over-the-counter anti-inflammatories.  On exam there is no blocks to rotation of her left hip on exam but there is pain in the groin on the extremes of internal and external rotation of the left hip.  The right operative hip moves smoothly and fluidly.  An AP pelvis and lateral left hip shows only just minimally slight superior lateral joint space narrowing.  This is not changed from films that we reviewed last year.  The right hip replacement looks good.  She is certainly experiencing some type of pain in the groin that is likely arthritic in nature.  I feel that she would benefit from both a therapeutic and diagnostic steroid injection in her left hip.  We will see if my partner Dr. Shon Baton can provide this soon under ultrasound guidance and then we would see her back in 4 weeks from now to see how she has responded to this injection.  She agrees with the treatment plan.  All questions and concerns were addressed and answered.

## 2023-09-18 ENCOUNTER — Ambulatory Visit: Payer: Medicare Other | Admitting: Orthopaedic Surgery

## 2023-09-24 ENCOUNTER — Other Ambulatory Visit (HOSPITAL_BASED_OUTPATIENT_CLINIC_OR_DEPARTMENT_OTHER): Payer: Self-pay | Admitting: Obstetrics & Gynecology

## 2023-09-24 DIAGNOSIS — B009 Herpesviral infection, unspecified: Secondary | ICD-10-CM

## 2023-09-25 ENCOUNTER — Ambulatory Visit: Payer: Medicare Other | Admitting: Orthopaedic Surgery

## 2023-10-09 ENCOUNTER — Encounter (HOSPITAL_BASED_OUTPATIENT_CLINIC_OR_DEPARTMENT_OTHER): Payer: Self-pay | Admitting: Obstetrics & Gynecology

## 2023-10-09 ENCOUNTER — Other Ambulatory Visit (HOSPITAL_BASED_OUTPATIENT_CLINIC_OR_DEPARTMENT_OTHER): Payer: Self-pay

## 2023-10-09 ENCOUNTER — Ambulatory Visit (HOSPITAL_BASED_OUTPATIENT_CLINIC_OR_DEPARTMENT_OTHER): Payer: Medicare Other | Admitting: Obstetrics & Gynecology

## 2023-10-09 VITALS — BP 132/84 | HR 90 | Ht 60.25 in | Wt 185.4 lb

## 2023-10-09 DIAGNOSIS — B009 Herpesviral infection, unspecified: Secondary | ICD-10-CM | POA: Diagnosis not present

## 2023-10-09 DIAGNOSIS — Z9189 Other specified personal risk factors, not elsewhere classified: Secondary | ICD-10-CM

## 2023-10-09 DIAGNOSIS — Z780 Asymptomatic menopausal state: Secondary | ICD-10-CM

## 2023-10-09 DIAGNOSIS — Z9889 Other specified postprocedural states: Secondary | ICD-10-CM | POA: Diagnosis not present

## 2023-10-09 MED ORDER — FAMCICLOVIR 250 MG PO TABS: 250.0000 mg | ORAL_TABLET | Freq: Two times a day (BID) | ORAL | 3 refills | Status: AC

## 2023-10-09 NOTE — Progress Notes (Signed)
68 y.o. G0P0000 Married White or Caucasian female here for breast and pelvic exam.  I am also following her for h/o HSV.  Does need RF for famvir.  No recent issues.  Denies vaginal bleeding.    Husband has been in the ICU for 11 days due to RSV.  She has not been vaccinated for this.  We discussed this today.     Patient's last menstrual period was 03/02/2011 (approximate).          Sexually active: No.  H/O STD:  h/o HSV  Health Maintenance: PCP:  Dr. Rogelia Rohrer.  Last wellness appt was 11/2022.  Did blood work at that appt:   Vaccines are up to date:  has not done RSV Colonoscopy:  01/09/2020, f/u 5 years MMG:  01/19/2023 Negative BMD:  12/23/2021 Normal Last pap smear:  06/07/2022 Negative.   H/o abnormal pap smear: H/O LEEP    reports that she quit smoking about 40 years ago. Her smoking use included cigarettes. She has never used smokeless tobacco. She reports current alcohol use of about 14.0 standard drinks of alcohol per week. She reports that she does not use drugs.  Past Medical History:  Diagnosis Date   Abnormal Pap smear    CIN I-LEEP   Allergy    Anemia    takes a multi vit   Arthritis    back    Cataract 2/23   Depression with anxiety 01/18/2015   GERD (gastroesophageal reflux disease)    HSV infection    Other and unspecified hyperlipidemia 05/20/2013   Stress fracture 2008   left foot   Trigger finger of left hand    ring finger    Past Surgical History:  Procedure Laterality Date   ANKLE SURGERY Left 07/2016   BREAST BIOPSY Left 2018   CATARACT EXTRACTION Bilateral 2023   COLONOSCOPY     last 2011 DB normal    EYE SURGERY  2005   Lasik   FOOT SURGERY Left 04/15/2014   left foot-bone spur, Dr. Lajoyce Corners; second surgery 01/2015 to correct prior surgery   FOOT SURGERY Left 01/2015   Dr Berneta Sages at [facility] TOE SURGERY Left 10+ yrs   left foot   LEEP  15+ yrs   normal paps since then   TOTAL HIP ARTHROPLASTY Right 02/25/2022   Procedure: RIGHT TOTAL  HIP ARTHROPLASTY ANTERIOR APPROACH;  Surgeon: Kathryne Hitch, MD;  Location: [facility];  Service: Orthopedics;  Laterality: Right;   WISDOM TOOTH EXTRACTION      Current Outpatient Medications  Medication Sig Dispense Refill   ALPRAZolam (XANAX) 0.25 MG tablet Take 1 tablet (0.25 mg total) by mouth 2 (two) times daily as needed for sleep or anxiety. 10 tablet 1   aspirin 81 MG chewable tablet Chew 1 tablet (81 mg total) by mouth 2 (two) times daily. 35 tablet 0   Calcium Carb-Cholecalciferol (CALCIUM 600/VITAMIN D3 PO) Take 1 tablet by mouth daily.     cetirizine (ZYRTEC) 10 MG tablet Take 10 mg by mouth daily.       famciclovir (FAMVIR) 250 MG tablet TAKE 1 TABLET BY MOUTH TWICE A DAY 180 tablet 0   fluticasone (FLONASE) 50 MCG/ACT nasal spray Place 2 sprays into both nostrils daily as needed for allergies. 16 g 3   Krill Oil 500 MG CAPS Take 500 mg by mouth daily.     Multiple Vitamins-Minerals (MULTIVITAMIN WITH MINERALS) tablet Take 1 tablet by mouth daily.  Multiple Vitamins-Minerals (PRESERVISION AREDS 2+MULTI VIT) CAPS Take 1 capsule by mouth daily.     pantoprazole (PROTONIX) 40 MG tablet TAKE 1 TABLET BY MOUTH EVERY DAY 90 tablet 3   Probiotic Product (PROBIOTIC PO) Take 1 capsule by mouth daily.     venlafaxine XR (EFFEXOR-XR) 75 MG 24 hr capsule TAKE ONE CAPSULE BY MOUTH EVERY DAY WITH BREAKFAST 90 capsule 2   No current facility-administered medications for this visit.    Family History  Problem Relation Age of Onset   Other Mother        CHF   Diabetes Mother        Borderline   Hypertension Mother    Obesity Mother    Anxiety disorder Mother    Heart disease Mother    Other Father        CHF   Obesity Sister    Diabetes Sister        type 2   Hypertension Sister    Anxiety disorder Sister    Diverticulosis Sister    Hyperlipidemia Sister    Arthritis Sister    Obesity Sister    Anxiety disorder Sister    Fibrocystic breast disease Sister    Thyroid  disease Sister    Colon polyps Sister    Obesity Sister    Obesity Brother    Anxiety disorder Brother    Arthritis Brother    Breast cancer Maternal Aunt    Cancer Maternal Aunt        breast   Breast cancer Paternal Aunt    Cancer Paternal Aunt        Breast   Obesity Paternal Grandmother    Other Paternal Grandfather        Committed suicide during the [place] Depression   Breast cancer Cousin    Cancer Cousin        recurrent metastatic, 10 year span   Colon cancer Neg Hx    Esophageal cancer Neg Hx    Rectal cancer Neg Hx    Stomach cancer Neg Hx     Review of Systems  Constitutional: Negative.   Genitourinary: Negative.     Exam:   BP 132/84 (BP Location: Right Arm, Patient Position: Sitting, Cuff Size: Large)   Pulse 90   Ht 5' 0.25" (1.53 m)   Wt 185 lb 6.4 oz (84.1 kg)   LMP 03/02/2011 (Approximate)   BMI 35.91 kg/m   Height: 5' 0.25" (153 cm)  General appearance: alert, cooperative and appears stated age Breasts: normal appearance, no masses or tenderness Abdomen: soft, non-tender; bowel sounds normal; no masses,  no organomegaly Lymph nodes: Cervical, supraclavicular, and axillary nodes normal.  No abnormal inguinal nodes palpated Neurologic: Grossly normal  Pelvic: External genitalia:  no lesions              Urethra:  normal appearing urethra with no masses, tenderness or lesions              Bartholins and Skenes: normal                 Vagina: normal appearing vagina with atrophic changes and no discharge, no lesions              Cervix: no lesions              Pap taken: No. Bimanual Exam:  Uterus:  normal size, contour, position, consistency, mobility, non-tender  Adnexa: normal adnexa and no mass, fullness, tenderness               Rectovaginal: Confirms               Anus:  normal sphincter tone, no lesions  Chaperone, Ina Homes, CMA, was present for exam.  Assessment/Plan: 1. GYN exam for high-risk Medicare patient  (Primary) - Pap smear 05/2022 - Mammogram 2024 - Colonoscopy 2021, follow up 5 years - Bone mineral density 2023.  Normal - lab work done with PCP, Dr. Rogelia Rohrer - vaccines reviewed/updated.  She is going to plan to proceed with RSV vaccination.  2. HSV (herpes simplex virus) infection - famciclovir (FAMVIR) 250 MG tablet; Take 1 tablet (250 mg total) by mouth 2 (two) times daily.  Dispense: 180 tablet; Refill: 3  3. History of loop electrical excision procedure (LEEP)  4. Postmenopausal

## 2023-11-14 ENCOUNTER — Encounter: Payer: Self-pay | Admitting: Family Medicine

## 2023-11-15 ENCOUNTER — Other Ambulatory Visit: Payer: Self-pay | Admitting: Emergency Medicine

## 2023-11-15 MED ORDER — FLUTICASONE PROPIONATE 50 MCG/ACT NA SUSP: 2.0000 | Freq: Every day | NASAL | 3 refills | Status: DC | PRN

## 2023-12-14 ENCOUNTER — Ambulatory Visit: Payer: Medicare Other | Admitting: Family Medicine

## 2023-12-14 ENCOUNTER — Encounter: Payer: Medicare Other | Admitting: Physician Assistant

## 2023-12-26 ENCOUNTER — Encounter: Admitting: Physician Assistant

## 2024-01-01 ENCOUNTER — Ambulatory Visit (INDEPENDENT_AMBULATORY_CARE_PROVIDER_SITE_OTHER): Admitting: Physician Assistant

## 2024-01-01 ENCOUNTER — Encounter: Payer: Self-pay | Admitting: Physician Assistant

## 2024-01-01 VITALS — BP 129/77 | HR 96 | Ht 60.5 in | Wt 182.0 lb

## 2024-01-01 DIAGNOSIS — E782 Mixed hyperlipidemia: Secondary | ICD-10-CM

## 2024-01-01 DIAGNOSIS — Z0000 Encounter for general adult medical examination without abnormal findings: Secondary | ICD-10-CM

## 2024-01-01 DIAGNOSIS — Z1231 Encounter for screening mammogram for malignant neoplasm of breast: Secondary | ICD-10-CM | POA: Diagnosis not present

## 2024-01-01 DIAGNOSIS — F419 Anxiety disorder, unspecified: Secondary | ICD-10-CM | POA: Diagnosis not present

## 2024-01-01 DIAGNOSIS — E559 Vitamin D deficiency, unspecified: Secondary | ICD-10-CM

## 2024-01-01 DIAGNOSIS — F32A Depression, unspecified: Secondary | ICD-10-CM

## 2024-01-01 MED ORDER — ALPRAZOLAM 0.25 MG PO TABS: 0.2500 mg | ORAL_TABLET | Freq: Every evening | ORAL | 0 refills | Status: AC | PRN

## 2024-01-01 NOTE — Assessment & Plan Note (Signed)
Repeat fasting lipids.

## 2024-01-01 NOTE — Progress Notes (Signed)
 Complete physical exam   Patient: Breanna Bridges   DOB: Aug 25, 1956   68 y.o. Female  MRN: [id number] Visit Date: 01/01/2024  Today's healthcare provider: Trenton Frock, PA-C   Cc. Cpe   Subjective    Breanna Bridges is a 68 y.o. female who presents today for a complete physical exam.   Discussed the use of AI scribe software for clinical note transcription with the patient, who gave verbal consent to proceed.  History of Present Illness   The patient presents for a routine physical examination. She reports significant emotional distress following the recent death of her husband in 20-Nov-2023. She describes symptoms of depression, including sleep disturbances and constant anxiety, despite being on Effexor  and occasional Xanax . She also reports persistent stomach discomfort, which she attributes to her emotional state, despite being on Protonix .      Past Medical History:  Diagnosis Date   Abnormal Pap smear    CIN I-LEEP   Allergy    Anemia    takes a multi vit   Arthritis    back    Cataract 2/23   Depression with anxiety 01/18/2015   GERD (gastroesophageal reflux disease)    HSV infection    Other and unspecified hyperlipidemia 05/20/2013   Stress fracture 2008   left foot   Trigger finger of left hand    ring finger   Past Surgical History:  Procedure Laterality Date   ANKLE SURGERY Left 07/2016   BREAST BIOPSY Left 2018   CATARACT EXTRACTION Bilateral 2023   COLONOSCOPY     last 2011 DB normal    EYE SURGERY  2005   Lasik   FOOT SURGERY Left 04/15/2014   left foot-bone spur, Dr. Julio Ohm; second surgery 01/2015 to correct prior surgery   FOOT SURGERY Left 01/2015   Dr Marino Sias at [facility] TOE SURGERY Left 10+ yrs   left foot   LEEP  15+ yrs   normal paps since then   TOTAL HIP ARTHROPLASTY Right 02/25/2022   Procedure: RIGHT TOTAL HIP ARTHROPLASTY ANTERIOR APPROACH;  Surgeon: Arnie Lao, MD;  Location: [facility];  Service: Orthopedics;   Laterality: Right;   WISDOM TOOTH EXTRACTION     Social History   Socioeconomic History   Marital status: Widowed    Spouse name: Not on file   Number of children: Not on file   Years of education: Not on file   Highest education level: Bachelor's degree (e.g., BA, AB, BS)  Occupational History   Not on file  Tobacco Use   Smoking status: Former    Current packs/day: 0.00    Types: Cigarettes    Quit date: 09/13/1983    Years since quitting: 40.3   Smokeless tobacco: Never  Vaping Use   Vaping status: Never Used  Substance and Sexual Activity   Alcohol use: Yes    Alcohol/week: 14.0 standard drinks of alcohol    Types: 14 Glasses of wine per week   Drug use: No   Sexual activity: Yes    Partners: Male    Birth control/protection: Post-menopausal, None    Comment: no dietary restrictions, lives with husband, 4 dogs  Other Topics Concern   Not on file  Social History Narrative   Lives with [place], husband, no major dietary restrictions, works in Chief Financial Officer, Surveyor, quantity, Counsellor   Social Drivers of Health   Financial Resource Strain: Low Risk  (12/25/2023)   Overall Financial Resource Strain (CARDIA)  Difficulty of Paying Living Expenses: Not hard at all  Food Insecurity: No Food Insecurity (12/25/2023)   Hunger Vital Sign    Worried About Running Out of Food in the Last Year: Never true    Ran Out of Food in the Last Year: Never true  Transportation Needs: No Transportation Needs (12/25/2023)   PRAPARE - Administrator, Civil Service (Medical): No    Lack of Transportation (Non-Medical): No  Physical Activity: Insufficiently Active (12/25/2023)   Exercise Vital Sign    Days of Exercise per Week: 1 day    Minutes of Exercise per Session: 20 min  Stress: Stress Concern Present (12/25/2023)   [employer] of Occupational Health - Occupational Stress Questionnaire    Feeling of Stress : To some extent  Social Connections: Moderately Integrated (12/25/2023)    Social Connection and Isolation Panel [NHANES]    Frequency of Communication with Friends and Family: More than three times a week    Frequency of Social Gatherings with Friends and Family: Twice a week    Attends Religious Services: More than 4 times per year    Active Member of [employer] or Organizations: Yes    Attends Banker Meetings: More than 4 times per year    Marital Status: Widowed  Catering manager Violence: Not on file   Family Status  Relation Name Status   Mother Siblings Deceased at age 26   Father  Deceased at age 71   Sister Emily,110 Alive       46 yrs old   Sister Dannie,76 Alive       63 yrs old   Sister Joyceann No, 43 Alive       11 yrs old   Brother Joe,71 Alive       72 yrs old   Daughter step,55 Alive       well   Son step,61 Secondary school teacher       well/well   Son step,38 Matt Alive   Mat Aunt Aunt tee Deceased at age 2   Pat Aunt Cousin Deceased at age mid 63's   MGM  Deceased at age mid 37's       old age   MGF  Deceased at age 85's       unknown history   PGM Sibs Deceased at age 54       old age   PGF  Deceased at age 13's or 51's   Cousin 65,maternal Alive   Neg Hx  (Not Specified)  No partnership data on file   Family History  Problem Relation Age of Onset   Other Mother        CHF   Diabetes Mother        Borderline   Hypertension Mother    Obesity Mother    Anxiety disorder Mother    Heart disease Mother    Other Father        CHF   Obesity Sister    Diabetes Sister        type 2   Hypertension Sister    Anxiety disorder Sister    Diverticulosis Sister    Hyperlipidemia Sister    Arthritis Sister    Obesity Sister    Anxiety disorder Sister    Fibrocystic breast disease Sister    Thyroid  disease Sister    Colon polyps Sister    Obesity Sister    Obesity Brother    Anxiety disorder Brother    Arthritis Brother  Breast cancer Maternal Aunt    Cancer Maternal Aunt        breast   Breast cancer Paternal Aunt     Cancer Paternal Aunt        Breast   Obesity Paternal Grandmother    Other Paternal Grandfather        Committed suicide during the [place] Depression   Breast cancer Cousin    Cancer Cousin        recurrent metastatic, 10 year span   Colon cancer Neg Hx    Esophageal cancer Neg Hx    Rectal cancer Neg Hx    Stomach cancer Neg Hx    Allergies  Allergen Reactions   Codeine Nausea Only    Patient Care Team: Neda Balk, MD as PCP - General (Family Medicine) Lillian Rein, MD as Consulting Physician (Gynecology)   Medications: Outpatient Medications Prior to Visit  Medication Sig   venlafaxine  XR (EFFEXOR -XR) 75 MG 24 hr capsule Take 150 mg by mouth daily with breakfast.   aspirin  81 MG chewable tablet Chew 1 tablet (81 mg total) by mouth 2 (two) times daily.   Calcium Carb-Cholecalciferol (CALCIUM 600/VITAMIN D3 PO) Take 1 tablet by mouth daily.   cetirizine (ZYRTEC) 10 MG tablet Take 10 mg by mouth daily.     famciclovir  (FAMVIR ) 250 MG tablet Take 1 tablet (250 mg total) by mouth 2 (two) times daily.   fluticasone  (FLONASE ) 50 MCG/ACT nasal spray Place 2 sprays into both nostrils daily as needed for allergies.   Krill Oil 500 MG CAPS Take 500 mg by mouth daily.   Multiple Vitamins-Minerals (MULTIVITAMIN WITH MINERALS) tablet Take 1 tablet by mouth daily.   Multiple Vitamins-Minerals (PRESERVISION AREDS 2+MULTI VIT) CAPS Take 1 capsule by mouth daily.   pantoprazole  (PROTONIX ) 40 MG tablet TAKE 1 TABLET BY MOUTH EVERY DAY   Probiotic Product (PROBIOTIC PO) Take 1 capsule by mouth daily.   [DISCONTINUED] ALPRAZolam  (XANAX ) 0.25 MG tablet Take 1 tablet (0.25 mg total) by mouth 2 (two) times daily as needed for sleep or anxiety.   [DISCONTINUED] venlafaxine  XR (EFFEXOR -XR) 75 MG 24 hr capsule TAKE ONE CAPSULE BY MOUTH EVERY DAY WITH BREAKFAST   No facility-administered medications prior to visit.    Review of Systems  Constitutional:  Negative for fatigue and fever.   Respiratory:  Negative for cough and shortness of breath.   Cardiovascular:  Negative for chest pain and leg swelling.  Gastrointestinal:  Negative for abdominal pain.  Neurological:  Negative for dizziness and headaches.  Psychiatric/Behavioral:  Positive for sleep disturbance. The patient is nervous/anxious.       Objective    BP 129/77   Pulse 96   Ht 5' 0.5" (1.537 m)   Wt 182 lb (82.6 kg)   LMP 03/02/2011 (Approximate)   BMI 34.96 kg/m    Physical Exam Constitutional:      General: She is awake.     Appearance: She is well-developed. She is not ill-appearing.  HENT:     Head: Normocephalic.     Right Ear: Tympanic membrane normal.     Left Ear: Tympanic membrane normal.     Nose: Nose normal. No congestion or rhinorrhea.     Mouth/Throat:     Pharynx: No oropharyngeal exudate or posterior oropharyngeal erythema.  Eyes:     Conjunctiva/sclera: Conjunctivae normal.     Pupils: Pupils are equal, round, and reactive to light.  Neck:     Thyroid : No thyroid  mass  or thyromegaly.  Cardiovascular:     Rate and Rhythm: Normal rate and regular rhythm.     Heart sounds: Normal heart sounds.  Pulmonary:     Effort: Pulmonary effort is normal.     Breath sounds: Normal breath sounds.  Abdominal:     Palpations: Abdomen is soft.     Tenderness: There is no abdominal tenderness.  Musculoskeletal:     Right lower leg: No swelling. No edema.     Left lower leg: No swelling. No edema.  Lymphadenopathy:     Cervical: No cervical adenopathy.  Skin:    General: Skin is warm.  Neurological:     Mental Status: She is alert and oriented to person, place, and time.  Psychiatric:        Attention and Perception: Attention normal.        Mood and Affect: Mood normal.        Speech: Speech normal.        Behavior: Behavior normal. Behavior is cooperative.      Last depression screening scores    10/09/2023    1:59 PM 12/06/2022   10:12 AM 06/06/2022   10:22 AM  PHQ 2/9  Scores  PHQ - 2 Score 0 0 0  PHQ- 9 Score  0    Last fall risk screening    01/01/2024    1:31 PM  Fall Risk   Falls in the past year? 0  Number falls in past yr: 0  Injury with Fall? 0  Risk for fall due to : No Fall Risks  Follow up Falls evaluation completed   Last Audit-C alcohol use screening    12/25/2023    9:25 AM  Alcohol Use Disorder Test (AUDIT)  1. How often do you have a drink containing alcohol? 4  2. How many drinks containing alcohol do you have on a typical day when you are drinking? 0  3. How often do you have six or more drinks on one occasion? 1  AUDIT-C Score 5   4. How often during the last year have you found that you were not able to stop drinking once you had started? 1  5. How often during the last year have you failed to do what was normally expected from you because of drinking? 0  6. How often during the last year have you needed a first drink in the morning to get yourself going after a heavy drinking session? 0  7. How often during the last year have you had a feeling of guilt of remorse after drinking? 1  8. How often during the last year have you been unable to remember what happened the night before because you had been drinking? 0  9. Have you or someone else been injured as a result of your drinking? 0  10. Has a relative or friend or a doctor or another health worker been concerned about your drinking or suggested you cut down? 0  Alcohol Use Disorder Identification Test Final Score (AUDIT) 7      Patient-reported   A score of 3 or more in women, and 4 or more in men indicates increased risk for alcohol abuse, EXCEPT if all of the points are from question 1   No results found for any visits on 01/01/24.  Assessment & Plan    Routine Health Maintenance and Physical Exam  Exercise Activities and Dietary recommendations   --balanced diet high in fiber and protein, low in sugars, carbs,  fats. --physical activity/exercise 20-30 minutes 3-5  times a week    Immunization History  Administered Date(s) Administered   Fluad Quad(high Dose 65+) 06/04/2020, 06/17/2022   Fluad Trivalent(High Dose 65+) 05/19/2023   Influenza Whole 05/13/2010, 07/08/2013   Influenza, High Dose Seasonal PF 07/13/2021   Influenza, Seasonal, Injecte, Preservative Fre 07/02/2015   Influenza,inj,Quad PF,6+ Mos 06/15/2017, 07/11/2018, 06/14/2019   Influenza-Unspecified 06/15/2017, 07/11/2018   PFIZER(Purple Top)SARS-COV-2 Vaccination 11/27/2019, 12/18/2019, 07/23/2020   Pfizer Covid-19 Vaccine Bivalent Booster 108yrs & up 06/24/2021   Pfizer(Comirnaty )Fall Seasonal Vaccine 12 years and older 07/18/2022, 07/20/2023   Respiratory Syncytial Virus Vaccine ,Recomb Aduvanted(Arexvy ) 12/06/2022   Td 09/13/2007   Tdap 12/13/2011, 07/20/2023   Zoster Recombinant(Shingrix ) 05/11/2021, 12/03/2021   Zoster, Live 09/25/2012    Health Maintenance  Topic Date Due   Pneumonia Vaccine 33+ Years old (1 of 1 - PCV) Never done   Medicare Annual Wellness (AWV)  12/04/2022   COVID-19 Vaccine (7 - 2024-25 season) 01/17/2024   INFLUENZA VACCINE  04/12/2024   Colonoscopy  01/08/2025   MAMMOGRAM  01/18/2025   DTaP/Tdap/Td (4 - Td or Tdap) 07/19/2033   DEXA SCAN  Completed   Hepatitis C Screening  Completed   Zoster Vaccines- Shingrix   Completed   HPV VACCINES  Aged Out   Meningococcal B Vaccine  Aged Out    Discussed health benefits of physical activity, and encouraged her to engage in regular exercise appropriate for her age and condition.  Problem List Items Addressed This Visit       Other   Hyperlipidemia, mixed   Repeat fasting lipids      Relevant Orders   Comp Met (CMET)   Lipid panel   Anxiety and depression   Option for patient to increase effexor  from 75 mg to 150 mg . Cautioned SE at higher doses, higher doses may not be as effective. F/b if needed . Cont with grief therapy.  Discussed xanax  0.25 mg use prn for sleep       Relevant Medications    ALPRAZolam  (XANAX ) 0.25 MG tablet   venlafaxine  XR (EFFEXOR -XR) 75 MG 24 hr capsule   Other Visit Diagnoses       Annual physical exam    -  Primary   Relevant Orders   CBC w/Diff   Comp Met (CMET)   Vitamin D  (25 hydroxy)   HgB A1c   TSH     Vitamin D  deficiency       Relevant Orders   Vitamin D  (25 hydroxy)     Breast cancer screening by mammogram       Relevant Orders   MM 3D SCREENING MAMMOGRAM BILATERAL BREAST      General Health Maintenance Routine wellness visit. Discussed general health maintenance, including upcoming mammogram and lab work. Up to date on most screenings and vaccinations. No record of pneumonia vaccine found in the state system. - Order lab work - Order mammogram - Recommend pneumonia vaccine, our office is out today      Return in about 6 months (around 07/02/2024) for chronic conditions.     Trenton Frock, PA-C  [facility] [phone] (phone) [phone] (fax)  [facility]

## 2024-01-01 NOTE — Assessment & Plan Note (Signed)
 Option for patient to increase effexor  from 75 mg to 150 mg . Cautioned SE at higher doses, higher doses may not be as effective. F/b if needed . Cont with grief therapy.  Discussed xanax  0.25 mg use prn for sleep

## 2024-01-03 ENCOUNTER — Other Ambulatory Visit

## 2024-01-05 ENCOUNTER — Telehealth (HOSPITAL_BASED_OUTPATIENT_CLINIC_OR_DEPARTMENT_OTHER): Payer: Self-pay

## 2024-01-05 ENCOUNTER — Other Ambulatory Visit (INDEPENDENT_AMBULATORY_CARE_PROVIDER_SITE_OTHER)

## 2024-01-05 DIAGNOSIS — E559 Vitamin D deficiency, unspecified: Secondary | ICD-10-CM | POA: Diagnosis not present

## 2024-01-05 DIAGNOSIS — Z0000 Encounter for general adult medical examination without abnormal findings: Secondary | ICD-10-CM

## 2024-01-05 DIAGNOSIS — Z1231 Encounter for screening mammogram for malignant neoplasm of breast: Secondary | ICD-10-CM

## 2024-01-05 DIAGNOSIS — E782 Mixed hyperlipidemia: Secondary | ICD-10-CM

## 2024-01-05 LAB — CBC WITH DIFFERENTIAL/PLATELET
Basophils Absolute: 0 10*3/uL (ref 0.0–0.1)
Basophils Relative: 0.9 % (ref 0.0–3.0)
Eosinophils Absolute: 0.1 10*3/uL (ref 0.0–0.7)
Eosinophils Relative: 2.3 % (ref 0.0–5.0)
HCT: 38.7 % (ref 36.0–46.0)
Hemoglobin: 13 g/dL (ref 12.0–15.0)
Lymphocytes Relative: 25 % (ref 12.0–46.0)
Lymphs Abs: 1 10*3/uL (ref 0.7–4.0)
MCHC: 33.5 g/dL (ref 30.0–36.0)
MCV: 90.9 fl (ref 78.0–100.0)
Monocytes Absolute: 0.4 10*3/uL (ref 0.1–1.0)
Monocytes Relative: 8.8 % (ref 3.0–12.0)
Neutro Abs: 2.6 10*3/uL (ref 1.4–7.7)
Neutrophils Relative %: 63 % (ref 43.0–77.0)
Platelets: 248 10*3/uL (ref 150.0–400.0)
RBC: 4.26 Mil/uL (ref 3.87–5.11)
RDW: 13.9 % (ref 11.5–15.5)
WBC: 4.2 10*3/uL (ref 4.0–10.5)

## 2024-01-05 LAB — COMPREHENSIVE METABOLIC PANEL WITH GFR
ALT: 15 U/L (ref 0–35)
AST: 17 U/L (ref 0–37)
Albumin: 4.4 g/dL (ref 3.5–5.2)
Alkaline Phosphatase: 84 U/L (ref 39–117)
BUN: 15 mg/dL (ref 6–23)
CO2: 29 meq/L (ref 19–32)
Calcium: 9.3 mg/dL (ref 8.4–10.5)
Chloride: 102 meq/L (ref 96–112)
Creatinine, Ser: 0.66 mg/dL (ref 0.40–1.20)
GFR: 90.64 mL/min (ref >60.00)
Glucose, Bld: 99 mg/dL (ref 70–99)
Potassium: 4.3 meq/L (ref 3.5–5.1)
Sodium: 140 meq/L (ref 135–145)
Total Bilirubin: 0.5 mg/dL (ref 0.2–1.2)
Total Protein: 6.8 g/dL (ref 6.0–8.3)

## 2024-01-05 LAB — LIPID PANEL
Cholesterol: 236 mg/dL — ABNORMAL HIGH (ref 0–200)
HDL: 94.2 mg/dL (ref >39.00)
LDL Cholesterol: 128 mg/dL — ABNORMAL HIGH (ref 0–99)
NonHDL: 141.47
Total CHOL/HDL Ratio: 3
Triglycerides: 66 mg/dL (ref 0.0–149.0)
VLDL: 13.2 mg/dL (ref 0.0–40.0)

## 2024-01-05 LAB — HEMOGLOBIN A1C: Hgb A1c MFr Bld: 5.4 % (ref 4.6–6.5)

## 2024-01-05 LAB — VITAMIN D 25 HYDROXY (VIT D DEFICIENCY, FRACTURES): VITD: 29.78 ng/mL — ABNORMAL LOW (ref 30.00–100.00)

## 2024-01-05 LAB — TSH: TSH: 2.81 u[IU]/mL (ref 0.35–5.50)

## 2024-01-08 ENCOUNTER — Encounter: Payer: Self-pay | Admitting: Physician Assistant

## 2024-01-25 ENCOUNTER — Telehealth (HOSPITAL_BASED_OUTPATIENT_CLINIC_OR_DEPARTMENT_OTHER): Payer: Self-pay

## 2024-02-11 ENCOUNTER — Ambulatory Visit
Admission: EM | Admit: 2024-02-11 | Discharge: 2024-02-11 | Disposition: A | Discharge status: Home / Self Care | Attending: Family Medicine | Admitting: Family Medicine

## 2024-02-11 ENCOUNTER — Encounter: Payer: Self-pay | Admitting: Emergency Medicine

## 2024-02-11 DIAGNOSIS — H1012 Acute atopic conjunctivitis, left eye: Secondary | ICD-10-CM

## 2024-02-11 MED ORDER — AZELASTINE HCL 0.05 % OP SOLN: 1.0000 [drp] | Freq: Two times a day (BID) | OPHTHALMIC | 0 refills | Status: AC

## 2024-02-11 NOTE — ED Provider Notes (Signed)
 [facility]    CSN: [id number] Arrival date & time: 02/11/24  1004      History   Chief Complaint Chief Complaint  Patient presents with   eye irritation     HPI Breanna Bridges is a 68 y.o. female presents for eye irritation.  Patient reports she has been having bilateral eye irritation over the past few days that seem to have worsened in her left eye yesterday.  Reports itching/irritation.  This morning she woke and it was crusted shut causing her concern.  Denies any additional drainage throughout the day.  No visual changes, eye pain, URI symptoms.  She has been using number getting eyedrops OTC which do help.  No other concerns at this time.  HPI  Past Medical History:  Diagnosis Date   Abnormal Pap smear    CIN I-LEEP   Allergy    Anemia    takes a multi vit   Arthritis    back    Cataract 2/23   Depression with anxiety 01/18/2015   GERD (gastroesophageal reflux disease)    HSV infection    Other and unspecified hyperlipidemia 05/20/2013   Stress fracture 2008   left foot   Trigger finger of left hand    ring finger    Patient Active Problem List   Diagnosis Date Noted   Hypocalcemia 12/06/2022   Status post total replacement of right hip 02/25/2022   Sun-damaged skin 12/05/2021   HSV (herpes simplex virus) infection 06/03/2021   History of loop electrical excision procedure (LEEP) 06/03/2021   Abnormal TSH 06/06/2020   Post herpetic neuralgia 10/27/2017   Hyperglycemia 01/31/2016   Anxiety and depression 01/18/2015   Sinusitis 12/05/2013   Hyperlipidemia, mixed 05/20/2013   Preventative health care 12/13/2011   Overweight 10/11/2010   History of cardiovascular disorder 10/11/2010   CROUP, HX OF 10/11/2010    Past Surgical History:  Procedure Laterality Date   ANKLE SURGERY Left 07/2016   BREAST BIOPSY Left 2018   CATARACT EXTRACTION Bilateral 2023   COLONOSCOPY     last 2011 DB normal    EYE SURGERY  2005   Lasik   FOOT SURGERY  Left 04/15/2014   left foot-bone spur, Dr. Julio Ohm; second surgery 01/2015 to correct prior surgery   FOOT SURGERY Left 01/2015   Dr Marino Sias at [facility] TOE SURGERY Left 10+ yrs   left foot   LEEP  15+ yrs   normal paps since then   TOTAL HIP ARTHROPLASTY Right 02/25/2022   Procedure: RIGHT TOTAL HIP ARTHROPLASTY ANTERIOR APPROACH;  Surgeon: Arnie Lao, MD;  Location: [facility];  Service: Orthopedics;  Laterality: Right;   WISDOM TOOTH EXTRACTION      OB History     Gravida  0   Para  0   Term  0   Preterm  0   AB  0   Living  0      SAB  0   IAB  0   Ectopic  0   Multiple  0   Live Births           Obstetric Comments  3 stepchildren          Home Medications    Prior to Admission medications   Medication Sig Start Date End Date Taking? Authorizing Provider  azelastine (OPTIVAR) 0.05 % ophthalmic solution Place 1 drop into the left eye 2 (two) times daily. 02/11/24  Yes Parissa Chiao, Jodi R, NP  ALPRAZolam  (XANAX ) 0.25 MG tablet Take 1 tablet (0.25 mg total) by mouth at bedtime as needed for sleep or anxiety. 01/01/24   Trenton Frock, PA-C  aspirin  81 MG chewable tablet Chew 1 tablet (81 mg total) by mouth 2 (two) times daily. 02/26/22   Bronson Canny, PA-C  Calcium Carb-Cholecalciferol (CALCIUM 600/VITAMIN D3 PO) Take 1 tablet by mouth daily.    [provider]  cetirizine (ZYRTEC) 10 MG tablet Take 10 mg by mouth daily.      [provider]  famciclovir  (FAMVIR ) 250 MG tablet Take 1 tablet (250 mg total) by mouth 2 (two) times daily. 10/09/23   Lillian Rein, MD  fluticasone  (FLONASE ) 50 MCG/ACT nasal spray Place 2 sprays into both nostrils daily as needed for allergies. 11/15/23   Neda Balk, MD  Krill Oil 500 MG CAPS Take 500 mg by mouth daily.    [provider]  Multiple Vitamins-Minerals (MULTIVITAMIN WITH MINERALS) tablet Take 1 tablet by mouth daily.    [provider]  Multiple Vitamins-Minerals  (PRESERVISION AREDS 2+MULTI VIT) CAPS Take 1 capsule by mouth daily.    [provider]  pantoprazole  (PROTONIX ) 40 MG tablet TAKE 1 TABLET BY MOUTH EVERY DAY 01/16/23   Neda Balk, MD  Probiotic Product (PROBIOTIC PO) Take 1 capsule by mouth daily.    [provider]  venlafaxine  XR (EFFEXOR -XR) 75 MG 24 hr capsule Take 150 mg by mouth daily with breakfast.    [provider]    Family History Family History  Problem Relation Age of Onset   Other Mother        CHF   Diabetes Mother        Borderline   Hypertension Mother    Obesity Mother    Anxiety disorder Mother    Heart disease Mother    Other Father        CHF   Obesity Sister    Diabetes Sister        type 2   Hypertension Sister    Anxiety disorder Sister    Diverticulosis Sister    Hyperlipidemia Sister    Arthritis Sister    Obesity Sister    Anxiety disorder Sister    Fibrocystic breast disease Sister    Thyroid  disease Sister    Colon polyps Sister    Obesity Sister    Obesity Brother    Anxiety disorder Brother    Arthritis Brother    Breast cancer Maternal Aunt    Cancer Maternal Aunt        breast   Breast cancer Paternal Aunt    Cancer Paternal Aunt        Breast   Obesity Paternal Grandmother    Other Paternal Grandfather        Committed suicide during the [place] Depression   Breast cancer Cousin    Cancer Cousin        recurrent metastatic, 10 year span   Colon cancer Neg Hx    Esophageal cancer Neg Hx    Rectal cancer Neg Hx    Stomach cancer Neg Hx     Social History Social History   Tobacco Use   Smoking status: Former    Current packs/day: 0.00    Types: Cigarettes    Quit date: 09/13/1983    Years since quitting: 40.4   Smokeless tobacco: Never  Vaping Use   Vaping status: Never Used  Substance Use Topics   Alcohol  use: Yes    Alcohol/week: 14.0 standard drinks of alcohol    Types: 14 Glasses of wine per week   Drug use: No     Allergies    Codeine   Review of Systems Review of Systems  Eyes:  Positive for itching.     Physical Exam Triage Vital Signs ED Triage Vitals  Encounter Vitals Group     BP 02/11/24 1020 122/78     Systolic BP Percentile --      Diastolic BP Percentile --      Pulse Rate 02/11/24 1020 79     Resp 02/11/24 1020 16     Temp 02/11/24 1020 98.2 F (36.8 C)     Temp Source 02/11/24 1020 Oral     SpO2 02/11/24 1020 96 %     Weight --      Height --      Head Circumference --      Peak Flow --      Pain Score 02/11/24 1019 0     Pain Loc --      Pain Education --      Exclude from Growth Chart --    No data found.  Updated Vital Signs BP 122/78 (BP Location: Left Arm)   Pulse 79   Temp 98.2 F (36.8 C) (Oral)   Resp 16   LMP 03/02/2011 (Approximate)   SpO2 96%   Visual Acuity Right Eye Distance:   Left Eye Distance:   Bilateral Distance:    Right Eye Near:   Left Eye Near:    Bilateral Near:     Physical Exam Vitals and nursing note reviewed.  Constitutional:      General: She is not in acute distress.    Appearance: Normal appearance. She is not ill-appearing.  HENT:     Head: Normocephalic and atraumatic.  Eyes:     General: Lids are normal.        Left eye: No foreign body or hordeolum.     Conjunctiva/sclera:     Right eye: Right conjunctiva is not injected. No chemosis, exudate or hemorrhage.    Left eye: Left conjunctiva is not injected. No chemosis, exudate or hemorrhage.    Pupils: Pupils are equal, round, and reactive to light.     Comments: Mild watery discharge from left conjunctiva  Cardiovascular:     Rate and Rhythm: Normal rate.  Pulmonary:     Effort: Pulmonary effort is normal.  Skin:    General: Skin is warm and dry.  Neurological:     General: No focal deficit present.     Mental Status: She is alert and oriented to person, place, and time.  Psychiatric:        Mood and Affect: Mood normal.        Behavior: Behavior normal.      UC  Treatments / Results  Labs (all labs ordered are listed, but only abnormal results are displayed) Labs Reviewed - No data to display  EKG   Radiology No results found.  Procedures Procedures (including critical care time)  Medications Ordered in UC Medications - No data to display  Initial Impression / Assessment and Plan / UC Course  I have reviewed the triage vital signs and the nursing notes.  Pertinent labs & imaging results that were available during my care of the patient were reviewed by me and considered in my medical decision making (see chart for details).     Reviewed exam  and symptoms with patient.  No red flags.  Discussed allergic conjunctivitis.  Start antihistamine eyedrops as prescribed.  Warm compresses as needed.  PCP follow-up if symptoms do not improve.  ER precautions reviewed. Final Clinical Impressions(s) / UC Diagnoses   Final diagnoses:  Allergic conjunctivitis of left eye     Discharge Instructions      Start antihistamine eyedrops twice daily to the left eye as needed.  Warm compresses to the eye as needed.  Please follow-up with your PCP if your symptoms do not improve.  Please go to the ER for any worsening symptoms.  Hope you feel better soon!  ED Prescriptions     Medication Sig Dispense Auth. Provider   azelastine (OPTIVAR) 0.05 % ophthalmic solution Place 1 drop into the left eye 2 (two) times daily. 6 mL Dennard Vezina, Jodi R, NP      PDMP not reviewed this encounter.   Alleen Arbour, NP 02/11/24 1038

## 2024-02-11 NOTE — Discharge Instructions (Addendum)
 Start antihistamine eyedrops twice daily to the left eye as needed.  Warm compresses to the eye as needed.  Please follow-up with your PCP if your symptoms do not improve.  Please go to the ER for any worsening symptoms.  Hope you feel better soon!

## 2024-02-11 NOTE — ED Triage Notes (Signed)
 Pt c/o left eye itching and irritation since yesterday. She woke up this morning and her eye was matted shut.

## 2024-02-12 ENCOUNTER — Ambulatory Visit (HOSPITAL_BASED_OUTPATIENT_CLINIC_OR_DEPARTMENT_OTHER)
Admission: RE | Admit: 2024-02-12 | Discharge: 2024-02-12 | Disposition: A | Discharge status: Home / Self Care | Source: Ambulatory Visit | Attending: Physician Assistant | Admitting: Physician Assistant

## 2024-02-12 ENCOUNTER — Encounter (HOSPITAL_BASED_OUTPATIENT_CLINIC_OR_DEPARTMENT_OTHER): Payer: Self-pay

## 2024-02-12 DIAGNOSIS — Z1231 Encounter for screening mammogram for malignant neoplasm of breast: Secondary | ICD-10-CM | POA: Diagnosis not present

## 2024-02-16 ENCOUNTER — Ambulatory Visit: Payer: Self-pay | Admitting: Physician Assistant

## 2024-03-06 ENCOUNTER — Telehealth: Payer: Self-pay | Admitting: Family Medicine

## 2024-03-06 NOTE — Telephone Encounter (Signed)
 Copied from CRM [id number]. Topic: Medicare AWV >> Mar 06, 2024  3:03 PM Nathanel DEL wrote: Reason for CRM: LVM 03/06/2024 to schedule AWV. Please schedule Virtual or Telehealth visits ONLY.   Nathanel Paschal; Care Guide Ambulatory Clinical Support [place] [facility] Direct Dial: [phone]

## 2024-03-22 ENCOUNTER — Ambulatory Visit
Admission: RE | Admit: 2024-03-22 | Discharge: 2024-03-22 | Disposition: A | Discharge status: Home / Self Care | Payer: Self-pay | Source: Ambulatory Visit | Attending: Urgent Care

## 2024-03-22 ENCOUNTER — Ambulatory Visit: Payer: Self-pay | Admitting: Urgent Care

## 2024-03-22 ENCOUNTER — Ambulatory Visit (INDEPENDENT_AMBULATORY_CARE_PROVIDER_SITE_OTHER)

## 2024-03-22 VITALS — BP 127/85 | HR 90 | Temp 97.7°F | Resp 16

## 2024-03-22 DIAGNOSIS — S8010XA Contusion of unspecified lower leg, initial encounter: Secondary | ICD-10-CM

## 2024-03-22 DIAGNOSIS — M25562 Pain in left knee: Secondary | ICD-10-CM | POA: Diagnosis not present

## 2024-03-22 DIAGNOSIS — S8000XA Contusion of unspecified knee, initial encounter: Secondary | ICD-10-CM | POA: Diagnosis not present

## 2024-03-22 DIAGNOSIS — M1712 Unilateral primary osteoarthritis, left knee: Secondary | ICD-10-CM | POA: Diagnosis not present

## 2024-03-22 MED ORDER — MELOXICAM 7.5 MG PO TABS: 7.5000 mg | ORAL_TABLET | Freq: Every day | ORAL | 0 refills | Status: AC

## 2024-03-22 NOTE — ED Triage Notes (Addendum)
 Pt present with lt knee pain. Pt fell two weeks ago and landed on her lt knee. States it hurts to kneel on it. Home interventions: Advil

## 2024-03-22 NOTE — ED Provider Notes (Signed)
 [facility]  Note:  This document was prepared using Conservation officer, historic buildings and may include unintentional dictation errors.  MRN: [id number] DOB: 01-Apr-1956  Subjective:   Breanna Bridges is a 68 y.o. female presenting for 2 week history of persistent left knee pain. Symptoms started after she suffered an accidental fall, absorbed the impact directly onto her left knee. No open wounds, drainage, bleeding, warmth, erythema. Has used ibuprofen. Has tolerated meloxicam  in the past.   No current facility-administered medications for this encounter.  Current Outpatient Medications:    ALPRAZolam  (XANAX ) 0.25 MG tablet, Take 1 tablet (0.25 mg total) by mouth at bedtime as needed for sleep or anxiety., Disp: 30 tablet, Rfl: 0   aspirin  81 MG chewable tablet, Chew 1 tablet (81 mg total) by mouth 2 (two) times daily., Disp: 35 tablet, Rfl: 0   azelastine  (OPTIVAR ) 0.05 % ophthalmic solution, Place 1 drop into the left eye 2 (two) times daily., Disp: 6 mL, Rfl: 0   Calcium Carb-Cholecalciferol (CALCIUM 600/VITAMIN D3 PO), Take 1 tablet by mouth daily., Disp: , Rfl:    cetirizine (ZYRTEC) 10 MG tablet, Take 10 mg by mouth daily.  , Disp: , Rfl:    famciclovir  (FAMVIR ) 250 MG tablet, Take 1 tablet (250 mg total) by mouth 2 (two) times daily., Disp: 180 tablet, Rfl: 3   fluticasone  (FLONASE ) 50 MCG/ACT nasal spray, Place 2 sprays into both nostrils daily as needed for allergies., Disp: 16 g, Rfl: 3   Krill Oil 500 MG CAPS, Take 500 mg by mouth daily., Disp: , Rfl:    Multiple Vitamins-Minerals (MULTIVITAMIN WITH MINERALS) tablet, Take 1 tablet by mouth daily., Disp: , Rfl:    Multiple Vitamins-Minerals (PRESERVISION AREDS 2+MULTI VIT) CAPS, Take 1 capsule by mouth daily., Disp: , Rfl:    pantoprazole  (PROTONIX ) 40 MG tablet, TAKE 1 TABLET BY MOUTH EVERY DAY, Disp: 90 tablet, Rfl: 3   Probiotic Product (PROBIOTIC PO), Take 1 capsule by mouth daily., Disp: , Rfl:     venlafaxine  XR (EFFEXOR -XR) 75 MG 24 hr capsule, Take 150 mg by mouth daily with breakfast., Disp: , Rfl:    Allergies  Allergen Reactions   Codeine Nausea Only    Past Medical History:  Diagnosis Date   Abnormal Pap smear    CIN I-LEEP   Allergy    Anemia    takes a multi vit   Arthritis    back    Cataract 2/23   Depression with anxiety 01/18/2015   GERD (gastroesophageal reflux disease)    HSV infection    Other and unspecified hyperlipidemia 05/20/2013   Stress fracture 2008   left foot   Trigger finger of left hand    ring finger     Past Surgical History:  Procedure Laterality Date   ANKLE SURGERY Left 07/2016   BREAST BIOPSY Left 2018   CATARACT EXTRACTION Bilateral 2023   COLONOSCOPY     last 2011 DB normal    EYE SURGERY  2005   Lasik   FOOT SURGERY Left 04/15/2014   left foot-bone spur, Dr. Harden; second surgery 01/2015 to correct prior surgery   FOOT SURGERY Left 01/2015   Dr Cyndi at [facility] TOE SURGERY Left 10+ yrs   left foot   LEEP  15+ yrs   normal paps since then   TOTAL HIP ARTHROPLASTY Right 02/25/2022   Procedure: RIGHT TOTAL HIP ARTHROPLASTY ANTERIOR APPROACH;  Surgeon: Vernetta Lonni GRADE, MD;  Location: [facility];  Service: Orthopedics;  Laterality: Right;   WISDOM TOOTH EXTRACTION      Family History  Problem Relation Age of Onset   Other Mother        CHF   Diabetes Mother        Borderline   Hypertension Mother    Obesity Mother    Anxiety disorder Mother    Heart disease Mother    Other Father        CHF   Obesity Sister    Diabetes Sister        type 2   Hypertension Sister    Anxiety disorder Sister    Diverticulosis Sister    Hyperlipidemia Sister    Arthritis Sister    Obesity Sister    Anxiety disorder Sister    Fibrocystic breast disease Sister    Thyroid  disease Sister    Colon polyps Sister    Obesity Sister    Obesity Brother    Anxiety disorder Brother    Arthritis Brother    Breast cancer Maternal  Aunt    Cancer Maternal Aunt        breast   Breast cancer Paternal Aunt    Cancer Paternal Aunt        Breast   Obesity Paternal Grandmother    Other Paternal Grandfather        Committed suicide during the [place] Depression   Breast cancer Cousin    Cancer Cousin        recurrent metastatic, 10 year span   Colon cancer Neg Hx    Esophageal cancer Neg Hx    Rectal cancer Neg Hx    Stomach cancer Neg Hx     Social History   Tobacco Use   Smoking status: Former    Current packs/day: 0.00    Types: Cigarettes    Quit date: 09/13/1983    Years since quitting: 40.5   Smokeless tobacco: Never  Vaping Use   Vaping status: Never Used  Substance Use Topics   Alcohol use: Yes    Alcohol/week: 14.0 standard drinks of alcohol    Types: 14 Glasses of wine per week   Drug use: No    ROS   Objective:   Vitals: BP 127/85 (BP Location: Right Arm)   Pulse 90   Temp 97.7 F (36.5 C) (Oral)   Resp 16   LMP 03/02/2011 (Approximate)   SpO2 96%   Physical Exam Constitutional:      General: She is not in acute distress.    Appearance: Normal appearance. She is well-developed. She is not ill-appearing, toxic-appearing or diaphoretic.  HENT:     Head: Normocephalic and atraumatic.     Nose: Nose normal.     Mouth/Throat:     Mouth: Mucous membranes are moist.  Eyes:     General: No scleral icterus.       Right eye: No discharge.        Left eye: No discharge.     Extraocular Movements: Extraocular movements intact.  Cardiovascular:     Rate and Rhythm: Normal rate.  Pulmonary:     Effort: Pulmonary effort is normal.  Musculoskeletal:     Left knee: Ecchymosis present. No swelling, deformity, effusion, erythema, lacerations, bony tenderness or crepitus. Normal range of motion. Tenderness (over area outlined with associated ecchymosis) present. Normal alignment and normal patellar mobility.       Legs:  Skin:    General: Skin is  warm and dry.  Neurological:     General:  No focal deficit present.     Mental Status: She is alert and oriented to person, place, and time.  Psychiatric:        Mood and Affect: Mood normal.        Behavior: Behavior normal.    Applied a 4 Ace wrap to the left lower leg and knee.   Assessment and Plan :   PDMP not reviewed this encounter.  1. Contusion of knee and lower leg, initial encounter   2. Acute pain of left knee    Recommended conservative management for left knee/lower leg contusion.  Use RICE method, meloxicam  for pain and inflammation. Counseled patient on potential for adverse effects with medications prescribed/recommended today, ER and return-to-clinic precautions discussed, patient verbalized understanding.    Christopher Savannah, PA-C 03/22/24 1039

## 2024-03-27 ENCOUNTER — Other Ambulatory Visit: Payer: Self-pay | Admitting: Family Medicine

## 2024-06-19 ENCOUNTER — Other Ambulatory Visit: Payer: Self-pay | Admitting: Family Medicine

## 2024-07-15 ENCOUNTER — Encounter: Payer: Self-pay | Admitting: Radiology

## 2024-08-16 ENCOUNTER — Telehealth: Payer: Self-pay | Admitting: Family Medicine

## 2024-08-16 NOTE — Telephone Encounter (Signed)
 Copied from CRM #[id number]. Topic: Medicare AWV >> Aug 16, 2024 11:06 AM Nathanel DEL wrote: Called LVM 08/16/2024 to sched AWV. Please schedule in office or virtual visit.   Nathanel Paschal; Care Guide Ambulatory Clinical Support [place] [facility] Direct Dial: [phone]

## 2024-09-10 ENCOUNTER — Other Ambulatory Visit (HOSPITAL_BASED_OUTPATIENT_CLINIC_OR_DEPARTMENT_OTHER): Payer: Self-pay

## 2024-09-10 MED ORDER — FLUZONE HIGH-DOSE 0.5 ML IM SUSY
0.5000 mL | PREFILLED_SYRINGE | Freq: Once | INTRAMUSCULAR | 0 refills | Status: AC
  Filled 2024-09-10: qty 0.5, 1d supply, fill #0

## 2025-01-02 ENCOUNTER — Encounter: Admitting: Family Medicine

## 2025-01-08 ENCOUNTER — Encounter: Admitting: Student
